# Patient Record
Sex: Female | Born: 1940 | Race: White | Hispanic: No | Smoking: Never smoker
Health system: Southern US, Community
[De-identification: ages and names within clinical notes are randomized; demographics above are authoritative.]

## PROBLEM LIST (undated history)

## (undated) DIAGNOSIS — I10 Essential (primary) hypertension: Secondary | ICD-10-CM

## (undated) DIAGNOSIS — M1990 Unspecified osteoarthritis, unspecified site: Secondary | ICD-10-CM

## (undated) DIAGNOSIS — E7800 Pure hypercholesterolemia, unspecified: Secondary | ICD-10-CM

## (undated) DIAGNOSIS — I7300 Raynaud's syndrome without gangrene: Secondary | ICD-10-CM

## (undated) HISTORY — PX: TUBAL LIGATION: SHX77

---

## 1999-10-19 ENCOUNTER — Other Ambulatory Visit: Admission: RE | Admit: 1999-10-19 | Discharge: 1999-10-19 | Payer: Self-pay | Admitting: Family Medicine

## 1999-11-09 ENCOUNTER — Encounter: Admission: RE | Admit: 1999-11-09 | Discharge: 1999-11-09 | Payer: Self-pay | Admitting: Family Medicine

## 1999-11-09 ENCOUNTER — Encounter: Payer: Self-pay | Admitting: Family Medicine

## 2000-11-07 ENCOUNTER — Other Ambulatory Visit: Admission: RE | Admit: 2000-11-07 | Discharge: 2000-11-07 | Payer: Self-pay | Admitting: *Deleted

## 2000-11-21 ENCOUNTER — Encounter: Admission: RE | Admit: 2000-11-21 | Discharge: 2000-11-21 | Payer: Self-pay | Admitting: Family Medicine

## 2000-11-21 ENCOUNTER — Encounter: Payer: Self-pay | Admitting: Family Medicine

## 2002-01-08 ENCOUNTER — Encounter: Admission: RE | Admit: 2002-01-08 | Discharge: 2002-01-08 | Payer: Self-pay | Admitting: Family Medicine

## 2002-01-08 ENCOUNTER — Encounter: Payer: Self-pay | Admitting: Family Medicine

## 2003-01-15 ENCOUNTER — Encounter: Payer: Self-pay | Admitting: Family Medicine

## 2003-01-15 ENCOUNTER — Encounter: Admission: RE | Admit: 2003-01-15 | Discharge: 2003-01-15 | Payer: Self-pay | Admitting: Family Medicine

## 2003-03-26 ENCOUNTER — Encounter: Admission: RE | Admit: 2003-03-26 | Discharge: 2003-03-26 | Payer: Self-pay | Admitting: Family Medicine

## 2003-10-22 ENCOUNTER — Emergency Department (HOSPITAL_COMMUNITY): Admission: EM | Admit: 2003-10-22 | Discharge: 2003-10-22 | Payer: Self-pay | Admitting: Family Medicine

## 2003-11-03 ENCOUNTER — Emergency Department (HOSPITAL_COMMUNITY): Admission: EM | Admit: 2003-11-03 | Discharge: 2003-11-03 | Payer: Self-pay | Admitting: Family Medicine

## 2004-09-14 ENCOUNTER — Ambulatory Visit: Payer: Self-pay | Admitting: Internal Medicine

## 2004-09-19 ENCOUNTER — Ambulatory Visit: Payer: Self-pay | Admitting: Internal Medicine

## 2005-02-16 ENCOUNTER — Emergency Department (HOSPITAL_COMMUNITY): Admission: EM | Admit: 2005-02-16 | Discharge: 2005-02-16 | Payer: Self-pay | Admitting: Emergency Medicine

## 2006-10-12 ENCOUNTER — Emergency Department (HOSPITAL_COMMUNITY): Admission: EM | Admit: 2006-10-12 | Discharge: 2006-10-12 | Payer: Self-pay | Admitting: Family Medicine

## 2006-10-16 ENCOUNTER — Emergency Department (HOSPITAL_COMMUNITY): Admission: EM | Admit: 2006-10-16 | Discharge: 2006-10-17 | Payer: Self-pay | Admitting: Emergency Medicine

## 2006-10-16 ENCOUNTER — Emergency Department (HOSPITAL_COMMUNITY): Admission: EM | Admit: 2006-10-16 | Discharge: 2006-10-16 | Payer: Self-pay | Admitting: Emergency Medicine

## 2008-11-05 ENCOUNTER — Emergency Department (HOSPITAL_COMMUNITY): Admission: EM | Admit: 2008-11-05 | Discharge: 2008-11-05 | Payer: Self-pay | Admitting: Emergency Medicine

## 2011-01-16 LAB — DIFFERENTIAL
Basophils Relative: 0
Eosinophils Absolute: 0.1
Eosinophils Relative: 1
Lymphs Abs: 1.3
Monocytes Relative: 9
Neutrophils Relative %: 79 — ABNORMAL HIGH

## 2011-01-16 LAB — CBC
HCT: 45.7
MCHC: 33.7
MCV: 91.9
RBC: 4.97
WBC: 11.5 — ABNORMAL HIGH

## 2011-01-16 LAB — URINALYSIS, ROUTINE W REFLEX MICROSCOPIC
Bilirubin Urine: NEGATIVE
Glucose, UA: NEGATIVE
Hgb urine dipstick: NEGATIVE
Ketones, ur: 15 — AB
Nitrite: NEGATIVE
Protein, ur: NEGATIVE
Specific Gravity, Urine: 1.02
Urobilinogen, UA: 0.2
pH: 7

## 2011-01-16 LAB — I-STAT 8, (EC8 V) (CONVERTED LAB)
Acid-Base Excess: 5 — ABNORMAL HIGH
Bicarbonate: 28.2 — ABNORMAL HIGH
Hemoglobin: 16.7 — ABNORMAL HIGH
Potassium: 4.1
Sodium: 136
TCO2: 29
pH, Ven: 7.511 — ABNORMAL HIGH

## 2011-10-09 ENCOUNTER — Other Ambulatory Visit: Payer: Self-pay | Admitting: Radiology

## 2012-12-18 ENCOUNTER — Emergency Department (HOSPITAL_COMMUNITY)
Admission: EM | Admit: 2012-12-18 | Discharge: 2012-12-18 | Disposition: A | Discharge status: Home / Self Care | Payer: Medicare Other | Source: Home / Self Care | Attending: Emergency Medicine | Admitting: Emergency Medicine

## 2012-12-18 ENCOUNTER — Encounter (HOSPITAL_COMMUNITY): Payer: Self-pay | Admitting: Emergency Medicine

## 2012-12-18 DIAGNOSIS — T6391XA Toxic effect of contact with unspecified venomous animal, accidental (unintentional), initial encounter: Secondary | ICD-10-CM

## 2012-12-18 HISTORY — DX: Pure hypercholesterolemia, unspecified: E78.00

## 2012-12-18 HISTORY — DX: Essential (primary) hypertension: I10

## 2012-12-18 HISTORY — DX: Unspecified osteoarthritis, unspecified site: M19.90

## 2012-12-18 HISTORY — DX: Raynaud's syndrome without gangrene: I73.00

## 2012-12-18 MED ORDER — SULFAMETHOXAZOLE-TMP DS 800-160 MG PO TABS: 1.0000 | ORAL_TABLET | Freq: Two times a day (BID) | ORAL | Status: DC

## 2012-12-18 MED ORDER — TRIAMCINOLONE ACETONIDE 0.1 % EX CREA: TOPICAL_CREAM | Freq: Three times a day (TID) | CUTANEOUS | Status: DC

## 2012-12-18 MED ORDER — CETIRIZINE HCL 10 MG PO TABS: 10.0000 mg | ORAL_TABLET | Freq: Every day | ORAL | Status: DC

## 2012-12-18 MED ORDER — CEPHALEXIN 500 MG PO CAPS: 500.0000 mg | ORAL_CAPSULE | Freq: Three times a day (TID) | ORAL | Status: DC

## 2012-12-18 NOTE — ED Notes (Signed)
Pt c/o insect bite to left forearm onset Monday yest her arm began to swell... sxs also include: redness, itching, localized fever Denies fevers Taking amoxicillin for an abscessed tooth... Has tried cortisone and benadryl Alert w/no signs of acute distress.

## 2012-12-18 NOTE — ED Provider Notes (Signed)
Chief Complaint:   Chief Complaint  Patient presents with  . Insect Bite    History of Present Illness:   Molly Wilcox is a 72 year old female who 3 days ago felt what she thought to be an insect stinging her on her left forearm. She felt just a sudden stinging sensation but did not see anything biting or stinging her and thereafter there was a small raised, reddened area on the forearm. This has gradually increased in size. It's red, swollen, itches, feels tight, and his slightly painful. She denies any fever or chills. She's had no numbness or tingling in the arm. She denies any difficulty breathing, wheezing, coughing, shortness of breath, or swelling of the lips, tongue, or throat.  Review of Systems:  Other than noted above, the patient denies any of the following symptoms: Systemic:  No fever, chills, sweats, weight loss, or fatigue. ENT:  No nasal congestion, rhinorrhea, sore throat, swelling of lips, tongue or throat. Resp:  No cough, wheezing, or shortness of breath. Skin:  No rash, itching, nodules, or suspicious lesions.  PMFSH:  Past medical history, family history, social history, meds, and allergies were reviewed. She's had an adverse reaction to prednisone in the past. The patient states that she became very drowsy, confused, disoriented, and dehydrated when she took it. Right now she takes atenolol, hydrochlorothiazide, and simvastatin. She has a history of high blood pressure, hypercholesterolemia, and osteoarthritis.  Physical Exam:   Vital signs:  BP 202/76  Pulse 84  Temp(Src) 98.3 F (36.8 C) (Oral)  Resp 16  SpO2 97% Gen:  Alert, oriented, in no distress. ENT:  Pharynx clear, no intraoral lesions, moist mucous membranes. Lungs:  Clear to auscultation. Skin:  There is an 11 x 12 cm raised, red area on the volar surface of the left forearm. This is mildly tender to touch. It has a fine maculopapular rash at the center. There is no visible stinger. No ulcerations. No  fluctuance. Radial pulse was full. All joints had a full range of motion without pain. Capillary refill is good and. Sensation was intact.     Assessment:  The encounter diagnosis was Insect sting, initial encounter.  This appears to be a severe local reaction to an insect sting. There is a possibility of infection, so will cover with antibiotics. Since she has had an adverse reaction to prednisone the past, I am reluctant to give her systemic steroids, Solu treat with topical steroids and antihistamine.  Plan:   1.  Meds:  The following meds were prescribed:   Discharge Medication List as of 12/18/2012  1:34 PM    START taking these medications   Details  cephALEXin (KEFLEX) 500 MG capsule Take 1 capsule (500 mg total) by mouth 3 (three) times daily., Starting 12/18/2012, Until Discontinued, Normal    cetirizine (ZYRTEC) 10 MG tablet Take 1 tablet (10 mg total) by mouth daily., Starting 12/18/2012, Until Discontinued, Normal    sulfamethoxazole-trimethoprim (BACTRIM DS) 800-160 MG per tablet Take 1 tablet by mouth 2 (two) times daily., Starting 12/18/2012, Until Discontinued, Normal    triamcinolone cream (KENALOG) 0.1 % Apply topically 3 (three) times daily., Starting 12/18/2012, Until Discontinued, Normal        2.  Patient Education/Counseling:  The patient was given appropriate handouts, self care instructions, and instructed in symptomatic relief.  Advised rest, ice, elevation.  3.  Follow up:  The patient was told to follow up if no better in 3 to 4 days, if becoming worse  in any way, and given some red flag symptoms such as fever or any difficulty breathing which would prompt immediate return.  Follow up here if necessary. The lesion was outlined with a dramatic graphic pan, I told her if the erythema seems to be spreading outside the confines of the markings to return for a recheck.      Reuben Likes, MD 12/18/12 [phone]

## 2013-05-06 LAB — HM COLONOSCOPY

## 2014-05-09 ENCOUNTER — Encounter (HOSPITAL_COMMUNITY): Payer: Self-pay | Admitting: Emergency Medicine

## 2014-05-09 ENCOUNTER — Emergency Department (INDEPENDENT_AMBULATORY_CARE_PROVIDER_SITE_OTHER)
Admission: EM | Admit: 2014-05-09 | Discharge: 2014-05-09 | Disposition: A | Discharge status: Home / Self Care | Payer: PPO | Source: Home / Self Care | Attending: Family Medicine | Admitting: Family Medicine

## 2014-05-09 DIAGNOSIS — J069 Acute upper respiratory infection, unspecified: Secondary | ICD-10-CM | POA: Diagnosis not present

## 2014-05-09 DIAGNOSIS — B309 Viral conjunctivitis, unspecified: Secondary | ICD-10-CM

## 2014-05-09 DIAGNOSIS — B9789 Other viral agents as the cause of diseases classified elsewhere: Secondary | ICD-10-CM

## 2014-05-09 MED ORDER — KETOROLAC TROMETHAMINE 0.5 % OP SOLN: 1.0000 [drp] | Freq: Four times a day (QID) | OPHTHALMIC | Status: DC

## 2014-05-09 MED ORDER — HYDROCODONE-HOMATROPINE 5-1.5 MG/5ML PO SYRP: 5.0000 mL | ORAL_SOLUTION | Freq: Four times a day (QID) | ORAL | Status: DC | PRN

## 2014-05-09 NOTE — ED Notes (Signed)
Reports noticing uri symptoms on Monday 2/1: initially laryngitis, continued cough, runny nose.  Denies sob, denies chills

## 2014-05-09 NOTE — Discharge Instructions (Signed)
Cough, Adult  A cough is a reflex that helps clear your throat and airways. It can help heal the body or may be a reaction to an irritated airway. A cough may only last 2 or 3 weeks (acute) or may last more than 8 weeks (chronic).  CAUSES Acute cough:  Viral or bacterial infections. Chronic cough:  Infections.  Allergies.  Asthma.  Post-nasal drip.  Smoking.  Heartburn or acid reflux.  Some medicines.  Chronic lung problems (COPD).  Cancer. SYMPTOMS   Cough.  Fever.  Chest pain.  Increased breathing rate.  High-pitched whistling sound when breathing (wheezing).  Colored mucus that you cough up (sputum). TREATMENT   A bacterial cough may be treated with antibiotic medicine.  A viral cough must run its course and will not respond to antibiotics.  Your caregiver may recommend other treatments if you have a chronic cough. HOME CARE INSTRUCTIONS   Only take over-the-counter or prescription medicines for pain, discomfort, or fever as directed by your caregiver. Use cough suppressants only as directed by your caregiver.  Use a cold steam vaporizer or humidifier in your bedroom or home to help loosen secretions.  Sleep in a semi-upright position if your cough is worse at night.  Rest as needed.  Stop smoking if you smoke. SEEK IMMEDIATE MEDICAL CARE IF:   You have pus in your sputum.  Your cough starts to worsen.  You cannot control your cough with suppressants and are losing sleep.  You begin coughing up blood.  You have difficulty breathing.  You develop pain which is getting worse or is uncontrolled with medicine.  You have a fever. MAKE SURE YOU:   Understand these instructions.  Will watch your condition.  Will get help right away if you are not doing well or get worse. Document Released: 09/16/2010 Document Revised: 06/12/2011 Document Reviewed: 09/16/2010 [facility] Patient Information 2015 [place], [place]. This information is not intended  to replace advice given to you by your health care provider. Make sure you discuss any questions you have with your health care provider.  Upper Respiratory Infection, Adult An upper respiratory infection (URI) is also sometimes known as the common cold. The upper respiratory tract includes the nose, sinuses, throat, trachea, and bronchi. Bronchi are the airways leading to the lungs. Most people improve within 1 week, but symptoms can last up to 2 weeks. A residual cough may last even longer.  CAUSES Many different viruses can infect the tissues lining the upper respiratory tract. The tissues become irritated and inflamed and often become very moist. Mucus production is also common. A cold is contagious. You can easily spread the virus to others by oral contact. This includes kissing, sharing a glass, coughing, or sneezing. Touching your mouth or nose and then touching a surface, which is then touched by another person, can also spread the virus. SYMPTOMS  Symptoms typically develop 1 to 3 days after you come in contact with a cold virus. Symptoms vary from person to person. They may include:  Runny nose.  Sneezing.  Nasal congestion.  Sinus irritation.  Sore throat.  Loss of voice (laryngitis).  Cough.  Fatigue.  Muscle aches.  Loss of appetite.  Headache.  Low-grade fever. DIAGNOSIS  You might diagnose your own cold based on familiar symptoms, since most people get a cold 2 to 3 times a year. Your caregiver can confirm this based on your exam. Most importantly, your caregiver can check that your symptoms are not due to another disease  such as strep throat, sinusitis, pneumonia, asthma, or epiglottitis. Blood tests, throat tests, and X-rays are not necessary to diagnose a common cold, but they may sometimes be helpful in excluding other more serious diseases. Your caregiver will decide if any further tests are required. RISKS AND COMPLICATIONS  You may be at risk for a more severe  case of the common cold if you smoke cigarettes, have chronic heart disease (such as heart failure) or lung disease (such as asthma), or if you have a weakened immune system. The very young and very old are also at risk for more serious infections. Bacterial sinusitis, middle ear infections, and bacterial pneumonia can complicate the common cold. The common cold can worsen asthma and chronic obstructive pulmonary disease (COPD). Sometimes, these complications can require emergency medical care and may be life-threatening. PREVENTION  The best way to protect against getting a cold is to practice good hygiene. Avoid oral or hand contact with people with cold symptoms. Wash your hands often if contact occurs. There is no clear evidence that vitamin C, vitamin E, echinacea, or exercise reduces the chance of developing a cold. However, it is always recommended to get plenty of rest and practice good nutrition. TREATMENT  Treatment is directed at relieving symptoms. There is no cure. Antibiotics are not effective, because the infection is caused by a virus, not by bacteria. Treatment may include:  Increased fluid intake. Sports drinks offer valuable electrolytes, sugars, and fluids.  Breathing heated mist or steam (vaporizer or shower).  Eating chicken soup or other clear broths, and maintaining good nutrition.  Getting plenty of rest.  Using gargles or lozenges for comfort.  Controlling fevers with ibuprofen or acetaminophen as directed by your caregiver.  Increasing usage of your inhaler if you have asthma. Zinc gel and zinc lozenges, taken in the first 24 hours of the common cold, can shorten the duration and lessen the severity of symptoms. Pain medicines may help with fever, muscle aches, and throat pain. A variety of non-prescription medicines are available to treat congestion and runny nose. Your caregiver can make recommendations and may suggest nasal or lung inhalers for other symptoms.  HOME  CARE INSTRUCTIONS   Only take over-the-counter or prescription medicines for pain, discomfort, or fever as directed by your caregiver.  Use a warm mist humidifier or inhale steam from a shower to increase air moisture. This may keep secretions moist and make it easier to breathe.  Drink enough water and fluids to keep your urine clear or pale yellow.  Rest as needed.  Return to work when your temperature has returned to normal or as your caregiver advises. You may need to stay home longer to avoid infecting others. You can also use a face mask and careful hand washing to prevent spread of the virus. SEEK MEDICAL CARE IF:   After the first few days, you feel you are getting worse rather than better.  You need your caregiver's advice about medicines to control symptoms.  You develop chills, worsening shortness of breath, or brown or red sputum. These may be signs of pneumonia.  You develop yellow or brown nasal discharge or pain in the face, especially when you bend forward. These may be signs of sinusitis.  You develop a fever, swollen neck glands, pain with swallowing, or white areas in the back of your throat. These may be signs of strep throat. SEEK IMMEDIATE MEDICAL CARE IF:   You have a fever.  You develop severe or persistent headache,  ear pain, sinus pain, or chest pain.  You develop wheezing, a prolonged cough, cough up blood, or have a change in your usual mucus (if you have chronic lung disease).  You develop sore muscles or a stiff neck. Document Released: 09/13/2000 Document Revised: 06/12/2011 Document Reviewed: 06/25/2013 [facility] Patient Information 2015 [place], [place]. This information is not intended to replace advice given to you by your health care provider. Make sure you discuss any questions you have with your health care provider.  Viral Conjunctivitis Conjunctivitis is an irritation (inflammation) of the clear membrane that covers the white part of the eye  (the conjunctiva). The irritation can also happen on the underside of the eyelids. Conjunctivitis makes the eye red or pink in color. This is what is commonly known as pink eye. Viral conjunctivitis can spread easily (contagious). CAUSES   Infection from virus on the surface of the eye.  Infection from the irritation or injury of nearby tissues such as the eyelids or cornea.  More serious inflammation or infection on the inside of the eye.  Other eye diseases.  The use of certain eye medications. SYMPTOMS  The normally white color of the eye or the underside of the eyelid is usually pink or red in color. The pink eye is usually associated with irritation, tearing and some sensitivity to light. Viral conjunctivitis is often associated with a clear, watery discharge. If a discharge is present, there may also be some blurred vision in the affected eye. DIAGNOSIS  Conjunctivitis is diagnosed by an eye exam. The eye specialist looks for changes in the surface tissues of the eye which take on changes characteristic of the specific types of conjunctivitis. A sample of any discharge may be collected on a Q-Tip (sterile swap). The sample will be sent to a lab to see whether or not the inflammation is caused by bacterial or viral infection. TREATMENT  Viral conjunctivitis will not respond to medicines that kill germs (antibiotics). Treatment is aimed at stopping a bacterial infection on top of the viral infection. The goal of treatment is to relieve symptoms (such as itching) with antihistamine drops or other eye medications.  HOME CARE INSTRUCTIONS   To ease discomfort, apply a cool, clean wash cloth to your eye for 10 to 20 minutes, 3 to 4 times a day.  Gently wipe away any drainage from the eye with a warm, wet washcloth or a cotton ball.  Wash your hands often with soap and use paper towels to dry.  Do not share towels or washcloths. This may spread the infection.  Change or wash your  pillowcase every day.  You should not use eye make-up until the infection is gone.  Stop using contacts lenses. Ask your eye professional how to sterilize or replace them before using again. This depends on the type of contact lenses used.  Do not touch the edge of the eyelid with the eye drop bottle or ointment tube when applying medications to the affected eye. This will stop you from spreading the infection to the other eye or to others. SEEK IMMEDIATE MEDICAL CARE IF:   The infection has not improved within 3 days of beginning treatment.  A watery discharge from the eye develops.  Pain in the eye increases.  The redness is spreading.  Vision becomes blurred.  An oral temperature above 102 F (38.9 C) develops, or as your caregiver suggests.  Facial pain, redness or swelling develops.  Any problems that may be related to the prescribed  medicine develop. MAKE SURE YOU:   Understand these instructions.  Will watch your condition.  Will get help right away if you are not doing well or get worse. Document Released: 03/20/2005 Document Revised: 06/12/2011 Document Reviewed: 11/07/2007 [facility] Patient Information 2015 [place], [place]. This information is not intended to replace advice given to you by your health care provider. Make sure you discuss any questions you have with your health care provider.

## 2014-05-09 NOTE — ED Provider Notes (Signed)
CSN: [id number]     Arrival date & time 05/09/14  1101 History   First MD Initiated Contact with Patient 05/09/14 1123     Chief Complaint  Patient presents with  . URI  . Conjunctivitis   (Consider location/radiation/quality/duration/timing/severity/associated sxs/prior Treatment) HPI       74 year old female presents complaining of cough, congestion, rhinorrhea, and conjunctivitis. Her symptoms started on Tuesday. She has been taking over-the-counter medications for the cough which are helping. She is using Visine for eyes which is not helping. This started in both eyes at the same time. She has redness and clear drainage, and burning sensation. No blurry vision. No recent travel or sick contacts.  Past Medical History  Diagnosis Date  . Hypertension   . High cholesterol   . Osteoarthritis   . Raynaud disease    Past Surgical History  Procedure Laterality Date  . Tubal ligation     No family history on file. History  Substance Use Topics  . Smoking status: Never Smoker   . Smokeless tobacco: Not on file  . Alcohol Use: Yes   OB History    No data available     Review of Systems  HENT: Positive for congestion, rhinorrhea and sneezing.   Eyes: Positive for discharge, redness and itching.  Respiratory: Positive for cough. Negative for shortness of breath.   Cardiovascular: Negative for chest pain.  All other systems reviewed and are negative.   Allergies  Review of patient's allergies indicates no known allergies.  Home Medications   Prior to Admission medications   Medication Sig Start Date End Date Taking? Authorizing Provider  aspirin 325 MG EC tablet Take 325 mg by mouth daily.   Yes Historical Provider, MD  atenolol (TENORMIN) 25 MG tablet Take 25 mg by mouth daily.    Historical Provider, MD  cephALEXin (KEFLEX) 500 MG capsule Take 1 capsule (500 mg total) by mouth 3 (three) times daily. 12/18/12   Harden Mo, MD  cetirizine (ZYRTEC) 10 MG tablet Take 1  tablet (10 mg total) by mouth daily. 12/18/12   Harden Mo, MD  Cholecalciferol (VITAMIN D-3 PO) Take by mouth.    Historical Provider, MD  HYDROcodone-homatropine (HYCODAN) 5-1.5 MG/5ML syrup Take 5 mLs by mouth every 6 (six) hours as needed for cough. 05/09/14   Liam Graham, PA-C  ketorolac (ACULAR) 0.5 % ophthalmic solution Place 1 drop into both eyes every 6 (six) hours. 05/09/14   Liam Graham, PA-C  simvastatin (ZOCOR) 10 MG tablet Take 10 mg by mouth at bedtime.    Historical Provider, MD  sulfamethoxazole-trimethoprim (BACTRIM DS) 800-160 MG per tablet Take 1 tablet by mouth 2 (two) times daily. 12/18/12   Harden Mo, MD  triamcinolone cream (KENALOG) 0.1 % Apply topically 3 (three) times daily. 12/18/12   Harden Mo, MD  triamterene-hydrochlorothiazide (MAXZIDE-25) 37.5-25 MG per tablet Take 1 tablet by mouth daily.    Historical Provider, MD   BP 162/85 mmHg  Pulse 91  Temp(Src) 97.9 F (36.6 C) (Oral)  Resp 20  SpO2 98% Physical Exam  Constitutional: She is oriented to person, place, and time. Vital signs are normal. She appears well-developed and well-nourished. No distress.  HENT:  Head: Normocephalic and atraumatic.  Right Ear: External ear normal.  Left Ear: External ear normal.  Nose: Nose normal.  Mouth/Throat: Oropharynx is clear and moist. No oropharyngeal exudate.  Eyes: Right conjunctiva is injected. Left conjunctiva is injected.  Neck: Normal range of  motion. Neck supple.  Cardiovascular: Normal rate, regular rhythm and normal heart sounds.   Pulmonary/Chest: Effort normal and breath sounds normal. No respiratory distress.  Lymphadenopathy:    She has no cervical adenopathy.  Neurological: She is alert and oriented to person, place, and time. She has normal strength. Coordination normal.  Skin: Skin is warm and dry. No rash noted. She is not diaphoretic.  Psychiatric: She has a normal mood and affect. Judgment normal.  Nursing note and vitals  reviewed.   ED Course  Procedures (including critical care time) Labs Review Labs Reviewed - No data to display  Imaging Review No results found.   MDM   1. Viral conjunctivitis   2. Viral URI with cough    Most likely viral conjunctivitis with the symmetric bilateral conjunctivitis and upper respiratory infection symptoms. Will provide better cough medicine for at night. She has still been up at night coughing. Also ketorolac drops for eye discomfort. Follow-up when necessary  Meds ordered this encounter  Medications  . aspirin 325 MG EC tablet    Sig: Take 325 mg by mouth daily.  [name] Kitchen HYDROcodone-homatropine (HYCODAN) 5-1.5 MG/5ML syrup    Sig: Take 5 mLs by mouth every 6 (six) hours as needed for cough.    Dispense:  120 mL    Refill:  0  . ketorolac (ACULAR) 0.5 % ophthalmic solution    Sig: Place 1 drop into both eyes every 6 (six) hours.    Dispense:  5 mL    Refill:  0       Liam Graham, PA-C 05/09/14 1141

## 2014-07-09 ENCOUNTER — Encounter: Payer: Self-pay | Admitting: Internal Medicine

## 2015-02-08 LAB — HM MAMMOGRAPHY

## 2015-04-03 LAB — BASIC METABOLIC PANEL
BUN: 23 mg/dL — AB (ref 4–21)
Creatinine: 0.9 mg/dL (ref 0.5–1.1)
GLUCOSE: 102 mg/dL
POTASSIUM: 4.6 mmol/L (ref 3.4–5.3)
Sodium: 139 mmol/L (ref 137–147)

## 2015-04-03 LAB — CBC AND DIFFERENTIAL
HCT: 47 % — AB (ref 36–46)
Hemoglobin: 15.6 g/dL (ref 12.0–16.0)
Platelets: 274 10*3/uL (ref 150–399)
WBC: 7.4 10^3/mL

## 2015-04-03 LAB — HEPATIC FUNCTION PANEL
ALT: 22 U/L (ref 7–35)
AST: 22 U/L (ref 13–35)
Alkaline Phosphatase: 133 U/L — AB (ref 25–125)

## 2015-04-03 LAB — LIPID PANEL
CHOLESTEROL: 160 mg/dL (ref 0–200)
HDL: 62 mg/dL (ref 35–70)
LDL Cholesterol: 78 mg/dL
TRIGLYCERIDES: 102 mg/dL (ref 40–160)

## 2015-07-29 DIAGNOSIS — R921 Mammographic calcification found on diagnostic imaging of breast: Secondary | ICD-10-CM | POA: Diagnosis not present

## 2015-10-12 ENCOUNTER — Ambulatory Visit (INDEPENDENT_AMBULATORY_CARE_PROVIDER_SITE_OTHER): Payer: PPO | Admitting: Family Medicine

## 2015-10-12 ENCOUNTER — Encounter: Payer: Self-pay | Admitting: Family Medicine

## 2015-10-12 VITALS — BP 150/80 | HR 56 | Temp 97.9°F | Resp 12 | Wt 145.0 lb

## 2015-10-12 DIAGNOSIS — R001 Bradycardia, unspecified: Secondary | ICD-10-CM

## 2015-10-12 DIAGNOSIS — M810 Age-related osteoporosis without current pathological fracture: Secondary | ICD-10-CM | POA: Diagnosis not present

## 2015-10-12 DIAGNOSIS — I10 Essential (primary) hypertension: Secondary | ICD-10-CM | POA: Diagnosis not present

## 2015-10-12 DIAGNOSIS — E559 Vitamin D deficiency, unspecified: Secondary | ICD-10-CM | POA: Diagnosis not present

## 2015-10-12 DIAGNOSIS — E7800 Pure hypercholesterolemia, unspecified: Secondary | ICD-10-CM | POA: Diagnosis not present

## 2015-10-12 LAB — LIPID PANEL
Cholesterol: 163 mg/dL (ref 0–200)
HDL: 52.2 mg/dL (ref >39.00)
LDL CALC: 86 mg/dL (ref 0–99)
NONHDL: 110.55
Total CHOL/HDL Ratio: 3
Triglycerides: 124 mg/dL (ref 0.0–149.0)
VLDL: 24.8 mg/dL (ref 0.0–40.0)

## 2015-10-12 LAB — COMPREHENSIVE METABOLIC PANEL
ALT: 28 U/L (ref 0–35)
AST: 24 U/L (ref 0–37)
Albumin: 4.6 g/dL (ref 3.5–5.2)
Alkaline Phosphatase: 66 U/L (ref 39–117)
BUN: 25 mg/dL — AB (ref 6–23)
CHLORIDE: 99 meq/L (ref 96–112)
CO2: 31 meq/L (ref 19–32)
Calcium: 9.7 mg/dL (ref 8.4–10.5)
Creatinine, Ser: 0.81 mg/dL (ref 0.40–1.20)
GFR: 73.11 mL/min (ref >60.00)
GLUCOSE: 105 mg/dL — AB (ref 70–99)
POTASSIUM: 4.3 meq/L (ref 3.5–5.1)
SODIUM: 138 meq/L (ref 135–145)
Total Bilirubin: 0.8 mg/dL (ref 0.2–1.2)
Total Protein: 7.2 g/dL (ref 6.0–8.3)

## 2015-10-12 LAB — VITAMIN D 25 HYDROXY (VIT D DEFICIENCY, FRACTURES): VITD: 44.04 ng/mL (ref 30.00–100.00)

## 2015-10-12 LAB — TSH: TSH: 1.8 u[IU]/mL (ref 0.35–4.50)

## 2015-10-12 MED ORDER — TRIAMTERENE-HCTZ 37.5-25 MG PO TABS: 1.0000 | ORAL_TABLET | Freq: Every day | ORAL | Status: DC

## 2015-10-12 MED ORDER — SIMVASTATIN 10 MG PO TABS: 10.0000 mg | ORAL_TABLET | Freq: Every day | ORAL | Status: DC

## 2015-10-12 MED ORDER — ALENDRONATE SODIUM 70 MG PO TABS: ORAL_TABLET | ORAL | Status: DC

## 2015-10-12 MED ORDER — ATENOLOL 25 MG PO TABS: 25.0000 mg | ORAL_TABLET | Freq: Every day | ORAL | Status: DC

## 2015-10-12 NOTE — Progress Notes (Signed)
Pre visit review using our clinic review tool, if applicable. No additional management support is needed unless otherwise documented below in the visit note. 

## 2015-10-12 NOTE — Patient Instructions (Signed)
A few things to remember from today's visit:   Essential hypertension - Plan: Comprehensive metabolic panel, TSH, atenolol (TENORMIN) 25 MG tablet, triamterene-hydrochlorothiazide (MAXZIDE-25) 37.5-25 MG tablet  Pure hypercholesterolemia - Plan: Lipid panel, simvastatin (ZOCOR) 10 MG tablet  Vitamin D deficiency - Plan: VITAMIN D 25 Hydroxy (Vit-D Deficiency, Fractures)  Osteoporosis - Plan: TSH, alendronate (FOSAMAX) 70 MG tablet  A few tips:  -As we age balance is not as good as it was, so there is a higher risks for falls. Please remove small rugs and furniture that is "in your way" and could increase the risk of falls. Stretching exercises may help with fall prevention: Yoga and Tai Chi are some examples. Low impact exercise is better, so you are not very achy the next day.  -Sun screen and avoidance of direct sun light recommended. Caution with dehydration, if working outdoors be sure to drink enough fluids.  - Some medications are not safe as we age, increases the risk of side effects and can potentially interact with other medication you are also taken;  including some of over the counter medications. Be sure to let me know when you start a new medication even if it is a dietary/vitamin supplement.   -Healthy diet low in red meet/animal fat and sugar + regular physical activity is recommended.      Please be sure medication list is accurate. If a new problem present, please set up appointment sooner than planned today.

## 2015-10-12 NOTE — Progress Notes (Signed)
HPI:   Molly Wilcox is a 75 y.o. female, who is here today to establish care with me.  Former PCP: [facility] Last preventive routine visit: 03/2015.  She has history of osteoporosis, she was started on Fosamax December 2016, tolerating well, no side effects reported. She has history of vitamin D deficiency, currently she is on OTC D3 1000 units daily.   She also has history of HTN and HLD.  Takes Zantac as needed for "indigestion", associated to certain food.  Concerns today: Medications refill.   Hyperlipidemia: She is on simvastatin 10 mg daily.  Following a low fat diet. She has not noted side effects with medication.  Hypertension: Currently she is on Maxzide 37.5-25 mg daily and atenolol 25 mg daily. She is taking medications as instructed, no side effects reported.  She has not noted unusual headache, visual changes, exertional chest pain, dyspnea,  focal weakness, or edema. She has a BP monitor but does not check her BP. She denies any history of CVD. Last eye exam about 2 years ago.  She lives with husband, who has a history of MS, she is his caregiver.  Independent ADL's except for mild stress urinary incontinence, independent IADL's. No falls in the past year and denies depression symptoms.    She is not sure about the last time she had lab work, she thinks it was a year ago.   Review of Systems  Constitutional: Negative for fever, activity change, appetite change, fatigue and unexpected weight change.  HENT: Negative for mouth sores, nosebleeds and trouble swallowing.   Eyes: Negative for redness and visual disturbance.  Respiratory: Negative for cough, shortness of breath and wheezing.   Cardiovascular: Negative for chest pain, palpitations and leg swelling.  Gastrointestinal: Negative for nausea, vomiting and abdominal pain.       Negative for changes in bowel habits.  Genitourinary: Negative for dysuria, hematuria, decreased  urine volume and difficulty urinating.  Skin: Negative for color change and rash.  Neurological: Negative for seizures, syncope, weakness, numbness and headaches.  Psychiatric/Behavioral: Negative for hallucinations, confusion and sleep disturbance.      Current Outpatient Prescriptions on File Prior to Visit  Medication Sig Dispense Refill  . aspirin 325 MG EC tablet Take 325 mg by mouth daily.     No current facility-administered medications on file prior to visit.     Past Medical History  Diagnosis Date  . Hypertension   . High cholesterol   . Osteoarthritis   . Raynaud disease    Allergies  Allergen Reactions  . Ibuprofen   . Prednisone     oral    History reviewed. No pertinent family history.  Social History   Social History  . Marital Status: Married    Spouse Name: N/A  . Number of Children: N/A  . Years of Education: N/A   Social History Main Topics  . Smoking status: Never Smoker   . Smokeless tobacco: None  . Alcohol Use: Yes  . Drug Use: No  . Sexual Activity: Not Asked   Other Topics Concern  . None   Social History Narrative    Filed Vitals:   10/12/15 1010  BP: 150/80  Pulse: 56  Temp: 97.9 F (36.6 C)  Resp: 12    There is no height on file to calculate BMI.      Physical Exam  Nursing note and vitals reviewed. Constitutional: She is oriented to person, place, and time. She appears  well-developed and well-nourished. No distress.  HENT:  Head: Atraumatic.  Mouth/Throat: Oropharynx is clear and moist and mucous membranes are normal.  Eyes: Conjunctivae and EOM are normal. Pupils are equal, round, and reactive to light.  Neck: No tracheal deviation present. No thyroid mass present.  Cardiovascular: Regular rhythm.  Bradycardia present.   No murmur heard. Pulses:      Dorsalis pedis pulses are 2+ on the right side, and 2+ on the left side.  Respiratory: Effort normal and breath sounds normal. No respiratory distress.  GI:  Soft. She exhibits no mass. There is no tenderness.  Musculoskeletal: She exhibits no edema or tenderness.  Lymphadenopathy:    She has no cervical adenopathy.  Neurological: She is alert and oriented to person, place, and time. She has normal strength. Coordination normal.  Skin: Skin is warm. No erythema.  Psychiatric: She has a normal mood and affect.  Well groomed, good eye contact.      ASSESSMENT AND PLAN:     Chemistry      Component Value Date/Time   NA 138 10/12/2015 1057   K 4.3 10/12/2015 1057   CL 99 10/12/2015 1057   CO2 31 10/12/2015 1057   BUN 25* 10/12/2015 1057   CREATININE 0.81 10/12/2015 1057      Component Value Date/Time   CALCIUM 9.7 10/12/2015 1057   ALKPHOS 66 10/12/2015 1057   AST 24 10/12/2015 1057   ALT 28 10/12/2015 1057   BILITOT 0.8 10/12/2015 1057     Lab Results  Component Value Date   TSH 1.80 10/12/2015   Lab Results  Component Value Date   CHOL 163 10/12/2015   HDL 52.20 10/12/2015   LDLCALC 86 10/12/2015   TRIG 124.0 10/12/2015   CHOLHDL 3 10/12/2015     [name] was seen today for new patient (initial visit).  Diagnoses and all orders for this visit:  Essential hypertension  BP re-checked 155/70, goal < 150/90. She states that she was stressed today, will monitor BP at home. No changes in current management. DASH diet recommended. Eye exam recommended annually. F/U in 3-4 months, before if needed.    -     Comprehensive metabolic panel -     TSH -     atenolol (TENORMIN) 25 MG tablet; Take 1 tablet (25 mg total) by mouth daily. -     triamterene-hydrochlorothiazide (MAXZIDE-25) 37.5-25 MG tablet; Take 1 tablet by mouth daily.  Pure hypercholesterolemia  No changes in current management, will follow labs done today and will give further recommendations accordingly. Low fat diet to continue. F/U in 6-12 months.  -     Lipid panel -     simvastatin (ZOCOR) 10 MG tablet; Take 1 tablet (10 mg total) by mouth at  bedtime.  Vitamin D deficiency  No changes in current management, will follow labs done today and will give further recommendations accordingly.   -     VITAMIN D 25 Hydroxy (Vit-D Deficiency, Fractures)  Osteoporosis  Fall precautions. Ca++ 1200 mg and Vit D [phone] U daily. Regular exercise. No changes in Fosamax, some side effects discussed.  F/U in a year.  -     TSH -     alendronate (FOSAMAX) 70 MG tablet; TAKE 1 TABLET(S) EVERY WEEK BY ORAL ROUTE  Sinus bradycardia  Mild. For now no changes on Atenolol, instructed to check HR periodically. Instructed about warning signs.     Waiting for records from [facility]. Reporting vaccination up to date  as well as colonoscopy. Mammogram recommended to be repeated in 02/2016.   Deeric Cruise G. Martinique, MD  [facility]. [place] office.

## 2015-10-22 ENCOUNTER — Encounter: Payer: Self-pay | Admitting: Family Medicine

## 2016-02-03 DIAGNOSIS — R920 Mammographic microcalcification found on diagnostic imaging of breast: Secondary | ICD-10-CM | POA: Diagnosis not present

## 2016-02-03 LAB — HM MAMMOGRAPHY

## 2016-02-14 ENCOUNTER — Ambulatory Visit: Payer: Self-pay | Admitting: Family Medicine

## 2016-02-14 DIAGNOSIS — Z0289 Encounter for other administrative examinations: Secondary | ICD-10-CM

## 2016-02-29 ENCOUNTER — Encounter: Payer: Self-pay | Admitting: Family Medicine

## 2016-03-20 NOTE — Progress Notes (Addendum)
 Subjective:   Molly Wilcox is a 75 y.o. female who presents for Medicare Annual (Subsequent) preventive examination.  The Patient was informed that the wellness visit is to identify future health risk and educate and initiate measures that can reduce risk for increased disease through the lifespan.    NO ROS; Medicare Wellness Visit 10/2015 OV Dr. Jordan;  Formerly with [facility]   Describes health as good, fair or great?   Preventive Screening -Counseling & Management  DEXA Ostoeporosis 02/04/2015 -2.7(fosamax) Colonoscopy 09/2004- Dr Hung aged out Mammogram 02/2016  Smoking history/ never smoked  Second Hand Smoke status; No Smokers in the home currently x 35 + years   ETOH YES ; JUST socially   RISK FACTORS Regular exercise does tai chi but is very active Doesn't walk like she should; was walking 3 miles;  States her pedometer still shows 7000 to 10000 miles when tracking    Diet; on  Fall risk no falls;  Mobility of Functional changes this year? no Safety; community, wears sunscreen, safe place for firearms; Motor vehicle accidents; no issues   Cardiac Risk Factors:  Advanced aged >65 in women Hyperlipidemia - chol 163; Trig 124; HDL 52; LDL 86 Diabetes - FBS 105 Family History neg Obesity neg  Eye exam just had at [facility] on 02/2016  Depression Screen PhQ 2: negative  Activities of Daily Living - See functional screen   Cognitive testing; Ad8 score; 0 or less than 2  MMSE deferred or completed if AD8 + 2 issues  Advanced Directives updating currently with lawyer  Patient Care Team: Betty G Jordan, MD as PCP - General (Family Medicine)   Immunization History  Administered Date(s) Administered  . Influenza, High Dose Seasonal PF 01/02/2016   Required Immunizations needed today  Screening test up to date or reviewed for plan of completion Health Maintenance Due  Topic Date Due  . PNA vac Low Risk Adult (1 of 2 - PCV13) 12/29/2004    Declines  tetanus currently  Can have tdap at any time Declines zoster States she has had both pneumonia vaccines [facility]  Last colonoscopy was 09/2014/ Dr Hung   Cardiac Risk Factors include: dyslipidemia;hypertension     Objective:     Vitals: BP 130/70   Pulse (!) 57   Ht 5' 3" (1.6 m)   Wt 145 lb 2 oz (65.8 kg)   SpO2 94%   BMI 25.71 kg/m   Body mass index is 25.71 kg/m.   Tobacco History  Smoking Status  . Never Smoker  Smokeless Tobacco  . Not on file     Counseling given: Yes   Past Medical History:  Diagnosis Date  . High cholesterol   . Hypertension   . Osteoarthritis   . Raynaud disease    Past Surgical History:  Procedure Laterality Date  . TUBAL LIGATION     No family history on file. History  Sexual Activity  . Sexual activity: Not on file    Outpatient Encounter Prescriptions as of 03/21/2016  Medication Sig  . alendronate (FOSAMAX) 70 MG tablet TAKE 1 TABLET(S) EVERY WEEK BY ORAL ROUTE  . aspirin 325 MG EC tablet Take 325 mg by mouth daily.  . atenolol (TENORMIN) 25 MG tablet Take 1 tablet (25 mg total) by mouth daily.  . ranitidine (ZANTAC) 150 MG tablet Take 150 mg by mouth at bedtime.  . simvastatin (ZOCOR) 10 MG tablet Take 1 tablet (10 mg total) by mouth at   bedtime.  . triamterene-hydrochlorothiazide (MAXZIDE-25) 37.5-25 MG tablet Take 1 tablet by mouth daily.   No facility-administered encounter medications on file as of 03/21/2016.     Activities of Daily Living In your present state of health, do you have any difficulty performing the following activities: 03/21/2016  Hearing? N  Vision? N  Difficulty concentrating or making decisions? N  Walking or climbing stairs? N  Dressing or bathing? N  Doing errands, shopping? N  Preparing Food and eating ? N  Using the Toilet? N  In the past six months, have you accidently leaked urine? Y  Do you have problems with loss of bowel control? N  Managing your Medications? N    Managing your Finances? N  Housekeeping or managing your Housekeeping? N  Some recent data might be hidden    Patient Care Team: Betty G Martinique, MD as PCP - General (Family Medicine)    Assessment:   Exercise Activities and Dietary recommendations Current Exercise Habits: Home exercise routine;Structured exercise class, Type of exercise: walking, Time (Minutes): 60, Frequency (Times/Week): 5 (iphone 7000 to 10000 ), Weekly Exercise (Minutes/Week): 300  Goals    . patient          Keep going to [place]  Will keep going there as much as you can      Fall Risk Fall Risk  03/21/2016  Falls in the past year? No   Depression Screen PHQ 2/9 Scores 03/21/2016  PHQ - 2 Score 0     Cognitive Function MMSE - Mini Mental State Exam 03/21/2016  Not completed: (No Data)    Ad8 score 0     Immunization History  Administered Date(s) Administered  . Influenza, High Dose Seasonal PF 01/02/2016   Screening Tests Health Maintenance  Topic Date Due  . PNA vac Low Risk Adult (1 of 2 - PCV13) 12/29/2004  . TETANUS/TDAP  07/01/2016 (Originally 12/30/1959)  . ZOSTAVAX  04/02/2017 (Originally 12/30/1999)  . INFLUENZA VACCINE  Completed  . DEXA SCAN  Completed      Plan:     Will check with Dr. Benson Norway and repeat colonoscopy as you had one 2 years ago   Eye exam completed Zoster; pass for now; will see what the 2018 zoster   Manuela Schwartz will try to call [facility] [place] [facility] for record   Flu vaccine given 01/2016  Keep taking vit d and calcium Can go to the osteoporosis foundation for more information Will have vit d drawn at next visit   During the course of the visit the patient was educated and counseled about the following appropriate screening and preventive services:   Vaccines to include Pneumoccal, Influenza, Hepatitis B, Td, Zostavax, HCV  Electrocardiogram  Cardiovascular Disease  Colorectal cancer screening  Bone density screening taking fosamax; calcium and vit d  1000  Diabetes screening neg  Glaucoma screening no   Mammography/completed this year  Nutrition counseling  adequate  Patient Instructions (the written plan) was given to the patient.   MAUQJ,FHLKT, RN  03/21/2016    addend [facility]; pneumonia series completed and documented Also call to dr. Benson Norway; last colonoscopy 05/06/2013 and was neg; clean colon; no repeats; abstracted

## 2016-03-21 ENCOUNTER — Telehealth: Payer: Self-pay

## 2016-03-21 ENCOUNTER — Ambulatory Visit (INDEPENDENT_AMBULATORY_CARE_PROVIDER_SITE_OTHER): Payer: PPO

## 2016-03-21 VITALS — BP 130/70 | HR 57 | Ht 63.0 in | Wt 145.1 lb

## 2016-03-21 DIAGNOSIS — Z0000 Encounter for general adult medical examination without abnormal findings: Secondary | ICD-10-CM | POA: Diagnosis not present

## 2016-03-21 NOTE — Telephone Encounter (Signed)
[facility] Confirmed The patient rec'd prevnar 12.28/2015 rec'd the PSV 23 12/29/2014 Will enter in epic

## 2016-03-21 NOTE — Patient Instructions (Addendum)
Molly Wilcox , Thank you for taking time to come for your Medicare Wellness Visit. I appreciate your ongoing commitment to your health goals. Please review the following plan we discussed and let me know if I can assist you in the future.   Will check with Dr. Benson Norway and repeat colonoscopy as you had one 2 years ago   Eye exam completed Zoster; pass for now; will see what the 2018 zoster   Manuela Schwartz will try to call [facility] for record   Flu vaccine given 01/2016  Keep taking vit d and calcium Can go to the osteoporosis [facility] for more information Will have vit d drawn at next visit   Weight is great!!!    These are the goals we discussed: Goals    . patient          Keep going to [employer]  Will keep going there as much as you can       This is a list of the screening recommended for you and due dates:  Health Maintenance  Topic Date Due  . Pneumonia vaccines (1 of 2 - PCV13) 12/29/2004  . Tetanus Vaccine  07/01/2016*  . Shingles Vaccine  04/02/2017*  . Flu Shot  Completed  . DEXA scan (bone density measurement)  Completed  *Topic was postponed. The date shown is not the original due date.        Fall Prevention in the Home Introduction Falls can cause injuries. They can happen to people of all ages. There are many things you can do to make your home safe and to help prevent falls. What can I do on the outside of my home?  Regularly fix the edges of walkways and driveways and fix any cracks.  Remove anything that might make you trip as you walk through a door, such as a raised step or threshold.  Trim any bushes or trees on the path to your home.  Use bright outdoor lighting.  Clear any walking paths of anything that might make someone trip, such as rocks or tools.  Regularly check to see if handrails are loose or broken. Make sure that both sides of any steps have handrails.  Any raised decks and porches should have guardrails on the  edges.  Have any leaves, snow, or ice cleared regularly.  Use sand or salt on walking paths during winter.  Clean up any spills in your garage right away. This includes oil or grease spills. What can I do in the bathroom?  Use night lights.  Install grab bars by the toilet and in the tub and shower. Do not use towel bars as grab bars.  Use non-skid mats or decals in the tub or shower.  If you need to sit down in the shower, use a plastic, non-slip stool.  Keep the floor dry. Clean up any water that spills on the floor as soon as it happens.  Remove soap buildup in the tub or shower regularly.  Attach bath mats securely with double-sided non-slip rug tape.  Do not have throw rugs and other things on the floor that can make you trip. What can I do in the bedroom?  Use night lights.  Make sure that you have a light by your bed that is easy to reach.  Do not use any sheets or blankets that are too big for your bed. They should not hang down onto the floor.  Have a firm chair that has side arms. You  can use this for support while you get dressed.  Do not have throw rugs and other things on the floor that can make you trip. What can I do in the kitchen?  Clean up any spills right away.  Avoid walking on wet floors.  Keep items that you use a lot in easy-to-reach places.  If you need to reach something above you, use a strong step stool that has a grab bar.  Keep electrical cords out of the way.  Do not use floor polish or wax that makes floors slippery. If you must use wax, use non-skid floor wax.  Do not have throw rugs and other things on the floor that can make you trip. What can I do with my stairs?  Do not leave any items on the stairs.  Make sure that there are handrails on both sides of the stairs and use them. Fix handrails that are broken or loose. Make sure that handrails are as long as the stairways.  Check any carpeting to make sure that it is firmly  attached to the stairs. Fix any carpet that is loose or worn.  Avoid having throw rugs at the top or bottom of the stairs. If you do have throw rugs, attach them to the floor with carpet tape.  Make sure that you have a light switch at the top of the stairs and the bottom of the stairs. If you do not have them, ask someone to add them for you. What else can I do to help prevent falls?  Wear shoes that:  Do not have high heels.  Have rubber bottoms.  Are comfortable and fit you well.  Are closed at the toe. Do not wear sandals.  If you use a stepladder:  Make sure that it is fully opened. Do not climb a closed stepladder.  Make sure that both sides of the stepladder are locked into place.  Ask someone to hold it for you, if possible.  Clearly mark and make sure that you can see:  Any grab bars or handrails.  First and last steps.  Where the edge of each step is.  Use tools that help you move around (mobility aids) if they are needed. These include:  Canes.  Walkers.  Scooters.  Crutches.  Turn on the lights when you go into a dark area. Replace any light bulbs as soon as they burn out.  Set up your furniture so you have a clear path. Avoid moving your furniture around.  If any of your floors are uneven, fix them.  If there are any pets around you, be aware of where they are.  Review your medicines with your doctor. Some medicines can make you feel dizzy. This can increase your chance of falling. Ask your doctor what other things that you can do to help prevent falls. This information is not intended to replace advice given to you by your health care provider. Make sure you discuss any questions you have with your health care provider. Document Released: 01/14/2009 Document Revised: 08/26/2015 Document Reviewed: 04/24/2014  2017 [employer]  Health Maintenance, Female Introduction Adopting a healthy lifestyle and getting preventive care can go a long way to  promote health and wellness. Talk with your health care provider about what schedule of regular examinations is right for you. This is a good chance for you to check in with your provider about disease prevention and staying healthy. In between checkups, there are plenty of things you can do  on your own. Experts have done a lot of research about which lifestyle changes and preventive measures are most likely to keep you healthy. Ask your health care provider for more information. Weight and diet Eat a healthy diet  Be sure to include plenty of vegetables, fruits, low-fat dairy products, and lean protein.  Do not eat a lot of foods high in solid fats, added sugars, or salt.  Get regular exercise. This is one of the most important things you can do for your health.  Most adults should exercise for at least 150 minutes each week. The exercise should increase your heart rate and make you sweat (moderate-intensity exercise).  Most adults should also do strengthening exercises at least twice a week. This is in addition to the moderate-intensity exercise. Maintain a healthy weight  Body mass index (BMI) is a measurement that can be used to identify possible weight problems. It estimates body fat based on height and weight. Your health care provider can help determine your BMI and help you achieve or maintain a healthy weight.  For females 25 years of age and older:  A BMI below 18.5 is considered underweight.  A BMI of 18.5 to 24.9 is normal.  A BMI of 25 to 29.9 is considered overweight.  A BMI of 30 and above is considered obese. Watch levels of cholesterol and blood lipids  You should start having your blood tested for lipids and cholesterol at 76 years of age, then have this test every 5 years.  You may need to have your cholesterol levels checked more often if:  Your lipid or cholesterol levels are high.  You are older than 76 years of age.  You are at high risk for heart  disease. Cancer screening Lung Cancer  Lung cancer screening is recommended for adults 63-47 years old who are at high risk for lung cancer because of a history of smoking.  A yearly low-dose CT scan of the lungs is recommended for people who:  Currently smoke.  Have quit within the past 15 years.  Have at least a 30-pack-year history of smoking. A pack year is smoking an average of one pack of cigarettes a day for 1 year.  Yearly screening should continue until it has been 15 years since you quit.  Yearly screening should stop if you develop a health problem that would prevent you from having lung cancer treatment. Breast Cancer  Practice breast self-awareness. This means understanding how your breasts normally appear and feel.  It also means doing regular breast self-exams. Let your health care provider know about any changes, no matter how small.  If you are in your 20s or 30s, you should have a clinical breast exam (CBE) by a health care provider every 1-3 years as part of a regular health exam.  If you are 12 or older, have a CBE every year. Also consider having a breast X-ray (mammogram) every year.  If you have a family history of breast cancer, talk to your health care provider about genetic screening.  If you are at high risk for breast cancer, talk to your health care provider about having an MRI and a mammogram every year.  Breast cancer gene (BRCA) assessment is recommended for women who have family members with BRCA-related cancers. BRCA-related cancers include:  Breast.  Ovarian.  Tubal.  Peritoneal cancers.  Results of the assessment will determine the need for genetic counseling and BRCA1 and BRCA2 testing. Cervical Cancer  Your health care provider  may recommend that you be screened regularly for cancer of the pelvic organs (ovaries, uterus, and vagina). This screening involves a pelvic examination, including checking for microscopic changes to the surface  of your cervix (Pap test). You may be encouraged to have this screening done every 3 years, beginning at age 85.  For women ages 50-65, health care providers may recommend pelvic exams and Pap testing every 3 years, or they may recommend the Pap and pelvic exam, combined with testing for human papilloma virus (HPV), every 5 years. Some types of HPV increase your risk of cervical cancer. Testing for HPV may also be done on women of any age with unclear Pap test results.  Other health care providers may not recommend any screening for nonpregnant women who are considered low risk for pelvic cancer and who do not have symptoms. Ask your health care provider if a screening pelvic exam is right for you.  If you have had past treatment for cervical cancer or a condition that could lead to cancer, you need Pap tests and screening for cancer for at least 20 years after your treatment. If Pap tests have been discontinued, your risk factors (such as having a new sexual partner) need to be reassessed to determine if screening should resume. Some women have medical problems that increase the chance of getting cervical cancer. In these cases, your health care provider may recommend more frequent screening and Pap tests. Colorectal Cancer  This type of cancer can be detected and often prevented.  Routine colorectal cancer screening usually begins at 76 years of age and continues through 76 years of age.  Your health care provider may recommend screening at an earlier age if you have risk factors for colon cancer.  Your health care provider may also recommend using home test kits to check for hidden blood in the stool.  A small camera at the end of a tube can be used to examine your colon directly (sigmoidoscopy or colonoscopy). This is done to check for the earliest forms of colorectal cancer.  Routine screening usually begins at age 47.  Direct examination of the colon should be repeated every 5-10 years  through 76 years of age. However, you may need to be screened more often if early forms of precancerous polyps or small growths are found. Skin Cancer  Check your skin from head to toe regularly.  Tell your health care provider about any new moles or changes in moles, especially if there is a change in a mole's shape or color.  Also tell your health care provider if you have a mole that is larger than the size of a pencil eraser.  Always use sunscreen. Apply sunscreen liberally and repeatedly throughout the day.  Protect yourself by wearing long sleeves, pants, a wide-brimmed hat, and sunglasses whenever you are outside. Heart disease, diabetes, and high blood pressure  High blood pressure causes heart disease and increases the risk of stroke. High blood pressure is more likely to develop in:  People who have blood pressure in the high end of the normal range (130-139/85-89 mm Hg).  People who are overweight or obese.  People who are African American.  If you are 51-59 years of age, have your blood pressure checked every 3-5 years. If you are 15 years of age or older, have your blood pressure checked every year. You should have your blood pressure measured twice-once when you are at a hospital or clinic, and once when you are not at  a hospital or clinic. Record the average of the two measurements. To check your blood pressure when you are not at a hospital or clinic, you can use:  An automated blood pressure machine at a pharmacy.  A home blood pressure monitor.  If you are between 69 years and 64 years old, ask your health care provider if you should take aspirin to prevent strokes.  Have regular diabetes screenings. This involves taking a blood sample to check your fasting blood sugar level.  If you are at a normal weight and have a low risk for diabetes, have this test once every three years after 76 years of age.  If you are overweight and have a high risk for diabetes, consider  being tested at a younger age or more often. Preventing infection Hepatitis B  If you have a higher risk for hepatitis B, you should be screened for this virus. You are considered at high risk for hepatitis B if:  You were born in a country where hepatitis B is common. Ask your health care provider which countries are considered high risk.  Your parents were born in a high-risk country, and you have not been immunized against hepatitis B (hepatitis B vaccine).  You have HIV or AIDS.  You use needles to inject street drugs.  You live with someone who has hepatitis B.  You have had sex with someone who has hepatitis B.  You get hemodialysis treatment.  You take certain medicines for conditions, including cancer, organ transplantation, and autoimmune conditions. Hepatitis C  Blood testing is recommended for:  Everyone born from [birth year] through 1966.  Anyone with known risk factors for hepatitis C. Sexually transmitted infections (STIs)  You should be screened for sexually transmitted infections (STIs) including gonorrhea and chlamydia if:  You are sexually active and are younger than 76 years of age.  You are older than 76 years of age and your health care provider tells you that you are at risk for this type of infection.  Your sexual activity has changed since you were last screened and you are at an increased risk for chlamydia or gonorrhea. Ask your health care provider if you are at risk.  If you do not have HIV, but are at risk, it may be recommended that you take a prescription medicine daily to prevent HIV infection. This is called pre-exposure prophylaxis (PrEP). You are considered at risk if:  You are sexually active and do not regularly use condoms or know the HIV status of your partner(s).  You take drugs by injection.  You are sexually active with a partner who has HIV. Talk with your health care provider about whether you are at high risk of being infected with  HIV. If you choose to begin PrEP, you should first be tested for HIV. You should then be tested every 3 months for as long as you are taking PrEP. Pregnancy  If you are premenopausal and you may become pregnant, ask your health care provider about preconception counseling.  If you may become pregnant, take 400 to 800 micrograms (mcg) of folic acid every day.  If you want to prevent pregnancy, talk to your health care provider about birth control (contraception). Osteoporosis and menopause  Osteoporosis is a disease in which the bones lose minerals and strength with aging. This can result in serious bone fractures. Your risk for osteoporosis can be identified using a bone density scan.  If you are 13 years of age or older, or  if you are at risk for osteoporosis and fractures, ask your health care provider if you should be screened.  Ask your health care provider whether you should take a calcium or vitamin D supplement to lower your risk for osteoporosis.  Menopause may have certain physical symptoms and risks.  Hormone replacement therapy may reduce some of these symptoms and risks. Talk to your health care provider about whether hormone replacement therapy is right for you. Follow these instructions at home:  Schedule regular health, dental, and eye exams.  Stay current with your immunizations.  Do not use any tobacco products including cigarettes, chewing tobacco, or electronic cigarettes.  If you are pregnant, do not drink alcohol.  If you are breastfeeding, limit how much and how often you drink alcohol.  Limit alcohol intake to no more than 1 drink per day for nonpregnant women. One drink equals 12 ounces of beer, 5 ounces of wine, or 1 ounces of hard liquor.  Do not use street drugs.  Do not share needles.  Ask your health care provider for help if you need support or information about quitting drugs.  Tell your health care provider if you often feel depressed.  Tell  your health care provider if you have ever been abused or do not feel safe at home. This information is not intended to replace advice given to you by your health care provider. Make sure you discuss any questions you have with your health care provider. Document Released: 10/03/2010 Document Revised: 08/26/2015 Document Reviewed: 12/22/2014  2017 [employer]

## 2016-03-23 NOTE — Progress Notes (Signed)
I have reviewed documentation from this visit and I agree with recommendations given.  Betty G. Jordan, MD  [facility]. [place] office.   

## 2016-06-03 ENCOUNTER — Other Ambulatory Visit: Payer: Self-pay | Admitting: Family Medicine

## 2016-06-03 DIAGNOSIS — I10 Essential (primary) hypertension: Secondary | ICD-10-CM

## 2016-07-01 ENCOUNTER — Other Ambulatory Visit: Payer: Self-pay | Admitting: Family Medicine

## 2016-07-01 DIAGNOSIS — I10 Essential (primary) hypertension: Secondary | ICD-10-CM

## 2016-09-01 ENCOUNTER — Other Ambulatory Visit: Payer: Self-pay | Admitting: Family Medicine

## 2016-09-01 DIAGNOSIS — E7800 Pure hypercholesterolemia, unspecified: Secondary | ICD-10-CM

## 2016-09-18 NOTE — Progress Notes (Signed)
HPI:   [name] is a 76 y.o. female, who is here today to follow on some chronic medical problems.  Last seen 10/12/15, 3-4 months f/u recommended then because elevated BP and bradycardia.  Hypertension:  Dx over 5 years ago. Currently on Maxzide 37.5-25 mg and Atenolol 25 mg daily.  BP readings at home: Not checking it. She does not use "a lot of salt" for food preparation.  She has has not noted headache, visual changes, exertional chest pain, dyspnea,  focal weakness, or edema.   Lab Results  Component Value Date   CREATININE 0.81 10/12/2015   BUN 25 (H) 10/12/2015   NA 138 10/12/2015   K 4.3 10/12/2015   CL 99 10/12/2015   CO2 31 10/12/2015    GERD: [place] food with green sauce exacerbate symptoms, so she takes medication when planning on eating this type of foof or after. Spicy food does not cause symptoms. She has acid reflux when bending down.   Denies abdominal pain, nausea, vomiting, changes in bowel habits, blood in stool or melena.  -She would like me to check lesions on her back. She has had these for years but cannot check lesions her self. She denies tenderness,easy bleeding, or other symptom. No prior Hx of skin cancer.  HLD: She also would like FLP done. She is currently on Zocor 10 mg daily. Tolerating med well. Denies Hx of CVD. 10/2015 TC 163,TG 124, HDL 52,LDL 86.   Review of Systems  Constitutional: Negative for activity change, appetite change, fatigue, fever and unexpected weight change.  HENT: Negative for mouth sores, nosebleeds and trouble swallowing.   Eyes: Negative for redness and visual disturbance.  Respiratory: Negative for cough, shortness of breath and wheezing.   Cardiovascular: Negative for chest pain, palpitations and leg swelling.  Gastrointestinal: Negative for abdominal pain, nausea and vomiting.       Negative for changes in bowel habits.  Genitourinary: Negative for decreased urine volume and  hematuria.  Musculoskeletal: Negative for gait problem and myalgias.  Neurological: Negative for dizziness, syncope, weakness and headaches.  Psychiatric/Behavioral: Negative for confusion. The patient is not nervous/anxious.       Current Outpatient Prescriptions on File Prior to Visit  Medication Sig Dispense Refill  . alendronate (FOSAMAX) 70 MG tablet TAKE 1 TABLET(S) EVERY WEEK BY ORAL ROUTE 13 tablet 3  . aspirin 325 MG EC tablet Take 325 mg by mouth daily.    . simvastatin (ZOCOR) 10 MG tablet TAKE 1 TABLET (10 MG TOTAL) BY MOUTH AT BEDTIME. 90 tablet 0  . triamterene-hydrochlorothiazide (MAXZIDE-25) 37.5-25 MG tablet TAKE 1 TABLET BY MOUTH DAILY. 90 tablet 0   No current facility-administered medications on file prior to visit.      Past Medical History:  Diagnosis Date  . High cholesterol   . Hypertension   . Osteoarthritis   . Raynaud disease    Allergies  Allergen Reactions  . Ibuprofen   . Prednisone     oral    Social History   Social History  . Marital status: Married    Spouse name: N/A  . Number of children: N/A  . Years of education: N/A   Social History Main Topics  . Smoking status: Never Smoker  . Smokeless tobacco: Never Used  . Alcohol use Yes  . Drug use: No  . Sexual activity: Not Asked   Other Topics Concern  . None   Social History Narrative  . None  Vitals:   09/19/16 1013  BP: (!) 148/80  Pulse: (!) 55  Resp: 12  O2 sat at RA 96% Body mass index is 25.93 kg/m.   Physical Exam  Nursing note and vitals reviewed. Constitutional: She is oriented to person, place, and time. She appears well-developed and well-nourished. No distress.  HENT:  Head: Atraumatic.  Mouth/Throat: Oropharynx is clear and moist and mucous membranes are normal.  Eyes: Conjunctivae and EOM are normal. Pupils are equal, round, and reactive to light.  Neck: No tracheal deviation present. Thyromegaly present.  Cardiovascular: Regular rhythm.   Bradycardia present.   No murmur heard. Pulses:      Dorsalis pedis pulses are 2+ on the right side, and 2+ on the left side.  Respiratory: Effort normal and breath sounds normal. No respiratory distress.  GI: Soft. She exhibits no mass. There is no hepatomegaly. There is no tenderness.  Musculoskeletal: She exhibits edema (Trace pitting edema LE, bilateral.).  Lymphadenopathy:    She has no cervical adenopathy.  Neurological: She is alert and oriented to person, place, and time. She has normal strength. Coordination normal.  Skin: Skin is warm. No rash noted. No erythema.     Scattered hyperpigmented lesion, mildly raised, no tender, on upper back,some on abdomen. One on mid back with uneven pigmentation, 3-4 mm, rounded, regular borders.  Psychiatric: She has a normal mood and affect.  Well groomed, good eye contact.     ASSESSMENT AND PLAN:   Molly Wilcox was seen today for follow-up.  Diagnoses and all orders for this visit:  Lab Results  Component Value Date   TSH 1.48 09/19/2016   Lab Results  Component Value Date   CHOL 144 09/19/2016   HDL 50.20 09/19/2016   LDLCALC 73 09/19/2016   TRIG 105.0 09/19/2016   CHOLHDL 3 09/19/2016     Chemistry      Component Value Date/Time   NA 140 09/19/2016 1128   NA 139 04/03/2015   K 3.7 09/19/2016 1128   CL 100 09/19/2016 1128   CO2 30 09/19/2016 1128   BUN 27 (H) 09/19/2016 1128   BUN 23 (A) 04/03/2015   CREATININE 0.85 09/19/2016 1128   GLU 102 04/03/2015      Component Value Date/Time   CALCIUM 9.8 09/19/2016 1128   ALKPHOS 51 09/19/2016 1128   AST 21 09/19/2016 1128   ALT 21 09/19/2016 1128   BILITOT 0.7 09/19/2016 1128      Essential hypertension  Re-checked 152/80. Not well controlled. Possible complications of elevated BP discussed. After discussion of some side effects she agrees with trying Amlodipine low dose, 2.5 mg. No changes in Maxzide. Decrease Atenolol to 12.5 mg. Low salt diet. Annual eye  examination. F/U in 6-8 weeks.  -     atenolol (TENORMIN) 25 MG tablet; Take 0.5 tablets (12.5 mg total) by mouth daily. -     EKG 12-Lead -     amLODipine (NORVASC) 2.5 MG tablet; Take 1 tablet (2.5 mg total) by mouth daily. -     Comprehensive metabolic panel  Bradycardia  Asymptomatic. EKG today: Sinus bradycardia, ? LAE,unsp T wave abn on ant leads and DIII, normal axis. No other EKG available for comparison. Decrease Atenolol from 25 mg to 12.5 mg daily, will wean off. Instructed to monitor HR at home. Instructed about warning signs.  -     EKG 12-Lead  Pure hypercholesterolemia No changes in current management, will follow labs done today and will give further recommendations accordingly.  Low fat diet to continue. Educated about risk of interaction between Zocor and CCB as Amlodipine, but both on low dose,so no changes for now.  -     Lipid panel  Enlarged thyroid gland  Further recommendations will be given according to labs/imaging results.  -     TSH -     US THYROID; Future  Keratosis, seborrheic  Reassured, no suspicious lesions.  Educated about SD. Still we will continue monitoring.     -[name] was advised to return sooner than planned today if new concerns arise.       Amish Mintzer G. Martinique, MD  [facility] [place] office.

## 2016-09-19 ENCOUNTER — Ambulatory Visit (INDEPENDENT_AMBULATORY_CARE_PROVIDER_SITE_OTHER): Payer: PPO | Admitting: Family Medicine

## 2016-09-19 ENCOUNTER — Encounter: Payer: Self-pay | Admitting: Family Medicine

## 2016-09-19 VITALS — BP 148/80 | HR 55 | Resp 12 | Ht 63.0 in | Wt 146.4 lb

## 2016-09-19 DIAGNOSIS — L821 Other seborrheic keratosis: Secondary | ICD-10-CM

## 2016-09-19 DIAGNOSIS — E049 Nontoxic goiter, unspecified: Secondary | ICD-10-CM | POA: Diagnosis not present

## 2016-09-19 DIAGNOSIS — E7800 Pure hypercholesterolemia, unspecified: Secondary | ICD-10-CM | POA: Diagnosis not present

## 2016-09-19 DIAGNOSIS — I10 Essential (primary) hypertension: Secondary | ICD-10-CM | POA: Diagnosis not present

## 2016-09-19 DIAGNOSIS — R001 Bradycardia, unspecified: Secondary | ICD-10-CM | POA: Diagnosis not present

## 2016-09-19 LAB — COMPREHENSIVE METABOLIC PANEL
ALK PHOS: 51 U/L (ref 39–117)
ALT: 21 U/L (ref 0–35)
AST: 21 U/L (ref 0–37)
Albumin: 4.8 g/dL (ref 3.5–5.2)
BUN: 27 mg/dL — ABNORMAL HIGH (ref 6–23)
CO2: 30 mEq/L (ref 19–32)
Calcium: 9.8 mg/dL (ref 8.4–10.5)
Chloride: 100 mEq/L (ref 96–112)
Creatinine, Ser: 0.85 mg/dL (ref 0.40–1.20)
GFR: 68.98 mL/min (ref >60.00)
Glucose, Bld: 113 mg/dL — ABNORMAL HIGH (ref 70–99)
POTASSIUM: 3.7 meq/L (ref 3.5–5.1)
Sodium: 140 mEq/L (ref 135–145)
TOTAL PROTEIN: 7 g/dL (ref 6.0–8.3)
Total Bilirubin: 0.7 mg/dL (ref 0.2–1.2)

## 2016-09-19 LAB — LIPID PANEL
CHOLESTEROL: 144 mg/dL (ref 0–200)
HDL: 50.2 mg/dL (ref >39.00)
LDL CALC: 73 mg/dL (ref 0–99)
NonHDL: 93.55
Total CHOL/HDL Ratio: 3
Triglycerides: 105 mg/dL (ref 0.0–149.0)
VLDL: 21 mg/dL (ref 0.0–40.0)

## 2016-09-19 LAB — TSH: TSH: 1.48 u[IU]/mL (ref 0.35–4.50)

## 2016-09-19 MED ORDER — AMLODIPINE BESYLATE 2.5 MG PO TABS: 2.5000 mg | ORAL_TABLET | Freq: Every day | ORAL | 2 refills | Status: DC

## 2016-09-19 MED ORDER — ATENOLOL 25 MG PO TABS: 12.5000 mg | ORAL_TABLET | Freq: Every day | ORAL | 2 refills | Status: DC

## 2016-09-19 NOTE — Patient Instructions (Signed)
A few things to remember from today's visit:   Essential hypertension - Plan: atenolol (TENORMIN) 25 MG tablet, EKG 12-Lead, amLODipine (NORVASC) 2.5 MG tablet  Bradycardia - Plan: EKG 12-Lead  Pure hypercholesterolemia  Atenolol decreased and Amlodipine 2.5 mg added. No changes in Zocor or Mazxide.  BP check at home and pulse.  Please be sure medication list is accurate. If a new problem present, please set up appointment sooner than planned today.

## 2016-09-21 ENCOUNTER — Encounter: Payer: Self-pay | Admitting: Family Medicine

## 2016-09-22 ENCOUNTER — Encounter: Payer: Self-pay | Admitting: Family Medicine

## 2016-09-23 ENCOUNTER — Encounter: Payer: Self-pay | Admitting: Family Medicine

## 2016-09-27 ENCOUNTER — Ambulatory Visit (HOSPITAL_COMMUNITY)
Admission: EM | Admit: 2016-09-27 | Discharge: 2016-09-27 | Disposition: A | Discharge status: Home / Self Care | Payer: PPO | Attending: Family Medicine | Admitting: Family Medicine

## 2016-09-27 ENCOUNTER — Encounter (HOSPITAL_COMMUNITY): Payer: Self-pay | Admitting: Emergency Medicine

## 2016-09-27 DIAGNOSIS — S61210S Laceration without foreign body of right index finger without damage to nail, sequela: Secondary | ICD-10-CM | POA: Diagnosis not present

## 2016-09-27 DIAGNOSIS — Z23 Encounter for immunization: Secondary | ICD-10-CM | POA: Diagnosis not present

## 2016-09-27 DIAGNOSIS — S61212A Laceration without foreign body of right middle finger without damage to nail, initial encounter: Secondary | ICD-10-CM | POA: Diagnosis not present

## 2016-09-27 DIAGNOSIS — W268XXA Contact with other sharp object(s), not elsewhere classified, initial encounter: Secondary | ICD-10-CM

## 2016-09-27 MED ORDER — TETANUS-DIPHTH-ACELL PERTUSSIS 5-2.5-18.5 LF-MCG/0.5 IM SUSP
0.5000 mL | Freq: Once | INTRAMUSCULAR | Status: AC
  Administered 2016-09-27: 0.5 mL via INTRAMUSCULAR

## 2016-09-27 MED ORDER — TETANUS-DIPHTH-ACELL PERTUSSIS 5-2.5-18.5 LF-MCG/0.5 IM SUSP
INTRAMUSCULAR | Status: AC
Start: 2016-09-27 — End: 2016-09-27
  Filled 2016-09-27: qty 0.5

## 2016-09-27 NOTE — ED Triage Notes (Signed)
The patient presented to the [facility] with a complaint of a laceration to the 3rd finger of her right hand that occurred today with a piece of plastic.

## 2016-09-27 NOTE — ED Provider Notes (Signed)
CSN: [id number]     Arrival date & time 09/27/16  1254 History   None    Chief Complaint  Patient presents with  . Laceration   (Consider location/radiation/quality/duration/timing/severity/associated sxs/prior Treatment) Patient cut her right middle finger on some plastic in the refriderator   The history is provided by the patient.  Laceration  Location:  Finger Finger laceration location:  R middle finger Length:  1 cm Depth:  Cutaneous Quality: straight   Bleeding: venous   Time since incident:  1 hour Laceration mechanism:  Blunt object Pain details:    Quality:  Aching   Severity:  Moderate   Past Medical History:  Diagnosis Date  . High cholesterol   . Hypertension   . Osteoarthritis   . Raynaud disease    Past Surgical History:  Procedure Laterality Date  . TUBAL LIGATION     History reviewed. No pertinent family history. Social History  Substance Use Topics  . Smoking status: Never Smoker  . Smokeless tobacco: Never Used  . Alcohol use Yes   OB History    No data available     Review of Systems  Constitutional: Negative.   HENT: Negative.   Eyes: Negative.   Respiratory: Negative.   Endocrine: Negative.   Genitourinary: Negative.   Allergic/Immunologic: Negative.   Neurological: Negative.     Allergies  Ibuprofen and Prednisone  Home Medications   Prior to Admission medications   Medication Sig Start Date End Date Taking? Authorizing Provider  alendronate (FOSAMAX) 70 MG tablet TAKE 1 TABLET(S) EVERY WEEK BY ORAL ROUTE 10/12/15   Martinique, Betty G, MD  amLODipine (NORVASC) 2.5 MG tablet Take 1 tablet (2.5 mg total) by mouth daily. 09/19/16   Martinique, Betty G, MD  aspirin 325 MG EC tablet Take 81 mg by mouth daily.     [provider]  atenolol (TENORMIN) 25 MG tablet Take 0.5 tablets (12.5 mg total) by mouth daily. 09/19/16   Martin[place], Betty G, MD  Ca Phosphate-Cholecalciferol (CALCIUM/VITAMIN D3 GUMMIES PO) Take 2 gummies by mouth  daily.    [provider]  ranitidine (ZANTAC) 150 MG tablet Take 150 mg by mouth as needed for heartburn.    [provider]  simvastatin (ZOCOR) 10 MG tablet TAKE 1 TABLET (10 MG TOTAL) BY MOUTH AT BEDTIME. 09/01/16   Martinique, Betty G, MD  triamterene-hydrochlorothiazide [facility]) 37.5-25 MG tablet TAKE 1 TABLET BY MOUTH DAILY. 07/03/16   Martinique, Betty G, MD   Meds Ordered and Administered this Visit   Medications  Tdap (BOOSTRIX) injection 0.5 mL (0.5 mLs Intramuscular Given 09/27/16 1341)    BP (!) 178/71 (BP Location: Left Arm)   Pulse 79   Temp 98.3 F (36.8 C) (Oral)   Resp 18   SpO2 98%  No data found.   Physical Exam  Constitutional: She appears well-developed and well-nourished.  HENT:  Head: Normocephalic and atraumatic.  Eyes: Conjunctivae and EOM are normal. Pupils are equal, round, and reactive to light.  Neck: Normal range of motion. Neck supple.  Cardiovascular: Normal rate, regular rhythm and normal heart sounds.   Pulmonary/Chest: Effort normal and breath sounds normal.  Skin:  1 cm laceration right middle finger.  Nursing note and vitals reviewed.   Urgent Care Course     .[name] KitchenLaceration Repair Date/Time: 09/27/2016 2:23 PM Performed by: Lysbeth Penner Authorized by: Vanessa Kick   Consent:    Consent obtained:  Verbal   Consent given by:  Patient   Risks  discussed:  Infection and pain   Alternatives discussed:  No treatment Anesthesia (see MAR for exact dosages):    Anesthesia method:  None Laceration details:    Location:  Finger   Finger location:  R long finger   Length (cm):  1   Depth (mm):  2 Repair type:    Repair type:  Simple Pre-procedure details:    Preparation:  Patient was prepped and draped in usual sterile fashion Exploration:    Hemostasis achieved with:  Direct pressure   Wound extent: areolar tissue violated     Contaminated: no   Treatment:    Area cleansed with:  Saline   Amount of cleaning:   Standard   Visualized foreign bodies/material removed: no   Skin repair:    Repair method:  Sutures   Suture size:  4-0   Suture material:  Nylon   Number of sutures:  3 Approximation:    Approximation:  Close   Vermilion border: well-aligned   Post-procedure details:    Dressing:  Adhesive bandage   (including critical care time)  Labs Review Labs Reviewed - No data to display  Imaging Review No results found.   Visual Acuity Review  Right Eye Distance:   Left Eye Distance:   Bilateral Distance:    Right Eye Near:   Left Eye Near:    Bilateral Near:         MDM   1. Laceration of right index finger without foreign body without damage to nail, sequela    Follow up in 7 days for suture removal  #3 4.0 nylon sutures placed.      Lysbeth Penner, [place] 09/27/16 1424

## 2016-09-27 NOTE — Discharge Instructions (Signed)
Follow up with pcp or with this clinic in 7 days for suture removal

## 2016-09-28 ENCOUNTER — Encounter: Payer: Self-pay | Admitting: Family Medicine

## 2016-09-29 ENCOUNTER — Ambulatory Visit
Admission: RE | Admit: 2016-09-29 | Discharge: 2016-09-29 | Disposition: A | Discharge status: Home / Self Care | Payer: PPO | Source: Ambulatory Visit | Attending: Family Medicine | Admitting: Family Medicine

## 2016-09-29 DIAGNOSIS — E049 Nontoxic goiter, unspecified: Secondary | ICD-10-CM | POA: Diagnosis not present

## 2016-10-08 ENCOUNTER — Encounter: Payer: Self-pay | Admitting: Family Medicine

## 2016-10-10 ENCOUNTER — Encounter: Payer: Self-pay | Admitting: Family Medicine

## 2016-10-10 ENCOUNTER — Other Ambulatory Visit: Payer: Self-pay | Admitting: Family Medicine

## 2016-10-10 DIAGNOSIS — E041 Nontoxic single thyroid nodule: Secondary | ICD-10-CM

## 2016-10-11 ENCOUNTER — Encounter: Payer: Self-pay | Admitting: Family Medicine

## 2016-10-16 ENCOUNTER — Telehealth: Payer: Self-pay | Admitting: Family Medicine

## 2016-10-16 NOTE — Telephone Encounter (Signed)
Patient checking on status of referral. Please update.  Thank you,  -LL

## 2016-10-28 ENCOUNTER — Other Ambulatory Visit: Payer: Self-pay | Admitting: Family Medicine

## 2016-10-28 DIAGNOSIS — I10 Essential (primary) hypertension: Secondary | ICD-10-CM

## 2016-11-20 NOTE — Progress Notes (Signed)
[name] is a 76 y.o.female, who is here today to follow on HTN. She is also requesting a CPE today, states that her insurance covers one annually.   She has seen on 09/19/16, when mild bradycardia noted,so Atenolol decreased from 25 mg to 12.5 mg. Amlodipine 2.5 mg added. She is also on Maxzide 37.5-25 mg daily.  Home BP readings: Not checking. Last eye exam within the last year.  She has checked HR and 60-75/min.  She is taking medications as instructed, no side effects reported.  She has not noted unusual headache, visual changes, exertional chest pain, dyspnea,  focal weakness, or edema.    Lab Results  Component Value Date   CREATININE 0.85 09/19/2016   BUN 27 (H) 09/19/2016   NA 140 09/19/2016   K 3.7 09/19/2016   CL 100 09/19/2016   CO2 30 09/19/2016   She lives with her husband, who has MS, she is his caregiver.She does not have somebody to help her with her husband's care.  HLD on Zocor 10 mg.  Lab Results  Component Value Date   CHOL 144 09/19/2016   HDL 50.20 09/19/2016   LDLCALC 73 09/19/2016   TRIG 105.0 09/19/2016   CHOLHDL 3 09/19/2016    Osteoporosis on Fosamax.  Tolerating medications well , no side effects reported.  Mammogram 02/2016: Birads 2. DEXA 02/2015:Osteoporosis.  Colonoscopy 05/2013.  Immunization History  Administered Date(s) Administered  . Influenza, High Dose Seasonal PF 01/02/2016  . Pneumococcal Conjugate-13 03/30/2014  . Pneumococcal Polysaccharide-23 12/29/2014  . Tdap 09/27/2016     Review of Systems  Constitutional: Negative for activity change, appetite change, fatigue, fever and unexpected weight change.  HENT: Negative for mouth sores, nosebleeds and trouble swallowing.   Eyes: Negative for redness and visual disturbance.  Respiratory: Negative for cough, shortness of breath and wheezing.   Cardiovascular: Negative for chest pain, palpitations and leg swelling.  Gastrointestinal: Negative for  abdominal pain, nausea and vomiting.       Negative for changes in bowel habits.  Endocrine: Negative for cold intolerance, heat intolerance, polydipsia, polyphagia and polyuria.  Genitourinary: Negative for decreased urine volume, dysuria and hematuria.  Musculoskeletal: Positive for arthralgias (stable). Negative for gait problem and myalgias.  Skin: Negative for rash and wound.  Neurological: Negative for syncope, weakness and headaches.  Hematological: Negative for adenopathy. Does not bruise/bleed easily.  Psychiatric/Behavioral: Negative for confusion. The patient is not nervous/anxious.   All other systems reviewed and are negative.    Current Outpatient Prescriptions on File Prior to Visit  Medication Sig Dispense Refill  . alendronate (FOSAMAX) 70 MG tablet TAKE 1 TABLET(S) EVERY WEEK BY ORAL ROUTE 13 tablet 3  . Ca Phosphate-Cholecalciferol (CALCIUM/VITAMIN D3 GUMMIES PO) Take 2 gummies by mouth daily.    . ranitidine (ZANTAC) 150 MG tablet Take 150 mg by mouth as needed for heartburn.    . simvastatin (ZOCOR) 10 MG tablet TAKE 1 TABLET (10 MG TOTAL) BY MOUTH AT BEDTIME. 90 tablet 0  . triamterene-hydrochlorothiazide (MAXZIDE-25) 37.5-25 MG tablet TAKE 1 TABLET BY MOUTH EVERY DAY 90 tablet 1   No current facility-administered medications on file prior to visit.      Past Medical History:  Diagnosis Date  . High cholesterol   . Hypertension   . Osteoarthritis   . Raynaud disease    Past Surgical History:  Procedure Laterality Date  . TUBAL LIGATION       Allergies  Allergen Reactions  . Ibuprofen   .  Prednisone     oral   Family History  Problem Relation Age of Onset  . Heart disease Father     Social History   Social History  . Marital status: Married    Spouse name: N/A  . Number of children: N/A  . Years of education: N/A   Social History Main Topics  . Smoking status: Never Smoker  . Smokeless tobacco: Never Used  . Alcohol use Yes  . Drug  use: No  . Sexual activity: Not Currently   Other Topics Concern  . None   Social History Narrative  . None    Vitals:   11/21/16 0944  BP: 120/74  Pulse: 69  Resp: 12  SpO2: 96%   Body mass index is 25.86 kg/m.   Wt Readings from Last 3 Encounters:  11/21/16 146 lb (66.2 kg)  09/19/16 146 lb 6 oz (66.4 kg)  03/21/16 145 lb 2 oz (65.8 kg)     Physical Exam  Nursing note and vitals reviewed. Constitutional: She is oriented to person, place, and time. She appears well-developed and well-nourished. No distress.  HENT:  Head: Normocephalic and atraumatic.  Right Ear: Tympanic membrane, external ear and ear canal normal.  Left Ear: Tympanic membrane, external ear and ear canal normal.  Mouth/Throat: Oropharynx is clear and moist and mucous membranes are normal.  Eyes: Pupils are equal, round, and reactive to light. Conjunctivae and EOM are normal.  Cardiovascular: Normal rate and regular rhythm.   No murmur heard. Pulses:      Dorsalis pedis pulses are 2+ on the right side, and 2+ on the left side.  Respiratory: Effort normal and breath sounds normal. No respiratory distress.  GI: Soft. She exhibits no mass. There is no hepatomegaly. There is no tenderness.  Musculoskeletal: She exhibits no edema or tenderness.  Lymphadenopathy:    She has no cervical adenopathy.  Neurological: She is alert and oriented to person, place, and time. She has normal strength. No cranial nerve deficit. Coordination and gait normal.  Reflex Scores:      Bicep reflexes are 2+ on the right side and 2+ on the left side.      Patellar reflexes are 2+ on the right side and 2+ on the left side. Skin: Skin is warm. No erythema.  Psychiatric: She has a normal mood and affect.  Well groomed, good eye contact.    ASSESSMENT AND PLAN:   [name] was seen today for follow-up and annual exam.  Diagnoses and all orders for this visit:  Routine general medical examination at a health care  facility  We discussed the importance of regular physical activity and healthy diet for prevention of chronic illness and/or complications. Preventive guidelines reviewed. Vaccination up to date. Aspirin for primary prevention discussed, she is tolerating well, side effects discussed. Ca++ and vit D supplementation recommended. Next CPE in 1 year.   Essential hypertension  Adequately controlled. Bradycardia resolved.  No changes in current management. DASH-low salt diet to continue. Eye exam recommended annually. F/U in 6 months, before if needed.  -     atenolol (TENORMIN) 25 MG tablet; Take 0.5 tablets (12.5 mg total) by mouth daily. -     amLODipine (NORVASC) 2.5 MG tablet; Take 1 tablet (2.5 mg total) by mouth daily.  Hyperglycemia  Primary prevention through healthy life style.  Lab Results  Component Value Date   HGBA1C 5.9 11/21/2016    -     POCT glycosylated hemoglobin (Hb A1C)      -  Molly Wilcox advised to return sooner than planned today if new concerns arise.     Molly G. Martinique, MD  [facility]. [place] office.

## 2016-11-21 ENCOUNTER — Ambulatory Visit (INDEPENDENT_AMBULATORY_CARE_PROVIDER_SITE_OTHER): Payer: PPO | Admitting: Family Medicine

## 2016-11-21 ENCOUNTER — Encounter: Payer: Self-pay | Admitting: Family Medicine

## 2016-11-21 VITALS — BP 120/74 | HR 69 | Resp 12 | Ht 63.0 in | Wt 146.0 lb

## 2016-11-21 DIAGNOSIS — R739 Hyperglycemia, unspecified: Secondary | ICD-10-CM

## 2016-11-21 DIAGNOSIS — I10 Essential (primary) hypertension: Secondary | ICD-10-CM | POA: Diagnosis not present

## 2016-11-21 DIAGNOSIS — Z0000 Encounter for general adult medical examination without abnormal findings: Secondary | ICD-10-CM

## 2016-11-21 LAB — POCT GLYCOSYLATED HEMOGLOBIN (HGB A1C): Hemoglobin A1C: 5.9

## 2016-11-21 MED ORDER — ATENOLOL 25 MG PO TABS: 12.5000 mg | ORAL_TABLET | Freq: Every day | ORAL | 1 refills | Status: DC

## 2016-11-21 MED ORDER — AMLODIPINE BESYLATE 2.5 MG PO TABS: 2.5000 mg | ORAL_TABLET | Freq: Every day | ORAL | 2 refills | Status: DC

## 2016-11-21 NOTE — Patient Instructions (Addendum)
A few things to remember from today's visit:   Essential hypertension  Bradycardia  Hyperglycemia - Plan: POCT glycosylated hemoglobin (Hb A1C)  Routine general medical examination at a health care facility  A few tips:  -As we age balance is not as good as it was, so there is a higher risks for falls. Please remove small rugs and furniture that is "in your way" and could increase the risk of falls. Stretching exercises may help with fall prevention: Yoga and Tai Chi are some examples. Low impact exercise is better, so you are not very achy the next day.  -Sun screen and avoidance of direct sun light recommended. Caution with dehydration, if working outdoors be sure to drink enough fluids.  - Some medications are not safe as we age, increases the risk of side effects and can potentially interact with other medication you are also taken;  including some of over the counter medications. Be sure to let me know when you start a new medication even if it is a dietary/vitamin supplement.   -Healthy diet low in red meet/animal fat and sugar + regular physical activity is recommended.     Please be sure medication list is accurate. If a new problem present, please set up appointment sooner than planned today.

## 2016-11-23 ENCOUNTER — Encounter: Payer: Self-pay | Admitting: Family Medicine

## 2016-11-29 ENCOUNTER — Other Ambulatory Visit: Payer: Self-pay | Admitting: Family Medicine

## 2016-11-29 DIAGNOSIS — E7800 Pure hypercholesterolemia, unspecified: Secondary | ICD-10-CM

## 2016-12-05 ENCOUNTER — Other Ambulatory Visit: Payer: Self-pay | Admitting: Family Medicine

## 2016-12-05 DIAGNOSIS — M810 Age-related osteoporosis without current pathological fracture: Secondary | ICD-10-CM

## 2016-12-06 ENCOUNTER — Other Ambulatory Visit (HOSPITAL_COMMUNITY)
Admission: RE | Admit: 2016-12-06 | Discharge: 2016-12-06 | Disposition: A | Discharge status: Home / Self Care | Payer: PPO | Source: Ambulatory Visit | Attending: Endocrinology | Admitting: Endocrinology

## 2016-12-06 ENCOUNTER — Ambulatory Visit (INDEPENDENT_AMBULATORY_CARE_PROVIDER_SITE_OTHER): Payer: PPO | Admitting: Endocrinology

## 2016-12-06 ENCOUNTER — Encounter: Payer: Self-pay | Admitting: Endocrinology

## 2016-12-06 DIAGNOSIS — E042 Nontoxic multinodular goiter: Secondary | ICD-10-CM | POA: Diagnosis not present

## 2016-12-06 NOTE — Progress Notes (Signed)
Subjective:    Patient ID: Molly Wilcox, female    DOB: June 27, 1940, 76 y.o.   MRN: [id number]  HPI Pt was noted at recent PCP visit to have a thyroid nodule.  She does not notice any swelling at the anterior neck, or assoc pain.   Past Medical History:  Diagnosis Date  . High cholesterol   . Hypertension   . Osteoarthritis   . Raynaud disease     Past Surgical History:  Procedure Laterality Date  . TUBAL LIGATION      Social History   Social History  . Marital status: Married    Spouse name: N/A  . Number of children: N/A  . Years of education: N/A   Occupational History  . Not on file.   Social History Main Topics  . Smoking status: Never Smoker  . Smokeless tobacco: Never Used  . Alcohol use Yes  . Drug use: No  . Sexual activity: Not Currently   Other Topics Concern  . Not on file   Social History Narrative  . No narrative on file    Current Outpatient Prescriptions on File Prior to Visit  Medication Sig Dispense Refill  . alendronate (FOSAMAX) 70 MG tablet TAKE 1 TABLET(S) EVERY WEEK BY ORAL ROUTE 12 tablet 3  . amLODipine (NORVASC) 2.5 MG tablet Take 1 tablet (2.5 mg total) by mouth daily. 90 tablet 2  . aspirin EC 81 MG tablet Take 81 mg by mouth daily.    [name] Kitchen atenolol (TENORMIN) 25 MG tablet Take 0.5 tablets (12.5 mg total) by mouth daily. 45 tablet 1  . Ca Phosphate-Cholecalciferol (CALCIUM/VITAMIN D3 GUMMIES PO) Take 2 gummies by mouth daily.    . ranitidine (ZANTAC) 150 MG tablet Take 150 mg by mouth as needed for heartburn.    . simvastatin (ZOCOR) 10 MG tablet TAKE 1 TABLET BY MOUTH EVERYDAY AT BEDTIME 90 tablet 2  . triamterene-hydrochlorothiazide (MAXZIDE-25) 37.5-25 MG tablet TAKE 1 TABLET BY MOUTH EVERY DAY 90 tablet 1   No current facility-administered medications on file prior to visit.     Allergies  Allergen Reactions  . Prednisone Other (See Comments)    Dehydration, amnesia   . Ibuprofen Nausea Only and Other (See Comments)      Nausea & headaches    Family History  Problem Relation Age of Onset  . Heart disease Father   . Thyroid disease Neg Hx     BP (!) 150/82   Pulse 85   Wt 147 lb 3.2 oz (66.8 kg)   SpO2 99%   BMI 26.08 kg/m    Review of Systems Denies weight change, hoarseness, visual loss, chest pain, sob, cough, dysphagia, diarrhea, itching, flushing, easy bruising, depression, cold intolerance, numbness, and rhinorrhea.  She seldom has headache.     Objective:   Physical Exam VS: see vs page GEN: no distress HEAD: head: no deformity eyes: no periorbital swelling, no proptosis external nose and ears are normal mouth: no lesion seen NECK: left thyroid nodule is palpable above the clavicle.   CHEST WALL: no deformity LUNGS: clear to auscultation CV: reg rate and rhythm, no murmur ABD: abdomen is soft, nontender.  no hepatosplenomegaly.  not distended.  no hernia MUSCULOSKELETAL: muscle bulk and strength are grossly normal.  no obvious joint swelling.  gait is normal and steady EXTEMITIES: no deformity.  no edema PULSES: no carotid bruit NEURO:  cn 2-12 grossly intact.   readily moves all 4's.  sensation is intact to touch  on all 4's SKIN:  Normal texture and temperature.  No rash or suspicious lesion is visible.   NODES:  None palpable at the neck PSYCH: alert, well-oriented.  Does not appear anxious nor depressed.   Lab Results  Component Value Date   TSH 1.48 09/19/2016   [place]: Normal-sized thyroid with bilateral nodules. Recommend FNA biopsy of moderately suspicious 3 cm inferior left nodule. Recommend follow-up 1 year ultrasound of right isthmic nodule and other lesions.  I have reviewed outside records, and summarized:  Pt was noted to have nodular goiter, and referred here.  Wellness, HTN, and hyperglycemia were also addressed.    thyroid needle bx: consent obtained, signed form on chart The area is first sprayed with cooling agent local: xylocaine 2%, with  epinephrine prep: alcohol pad 4 bxs are done with 25 and 46F needles no complications     Assessment & Plan:  multinodular goiter, new to me, uncertain etiology.   Patient Instructions  We'll let you know about the biopsy results. If as expected, no cancer is found, Please come back for a follow-up appointment in 6-12 months.

## 2016-12-06 NOTE — Patient Instructions (Signed)
We'll let you know about the biopsy results. If as expected, no cancer is found, Please come back for a follow-up appointment in 6-12 months. 

## 2016-12-07 DIAGNOSIS — E042 Nontoxic multinodular goiter: Secondary | ICD-10-CM | POA: Diagnosis not present

## 2016-12-21 ENCOUNTER — Encounter: Payer: Self-pay | Admitting: Family Medicine

## 2016-12-28 ENCOUNTER — Encounter: Payer: Self-pay | Admitting: Family Medicine

## 2016-12-28 ENCOUNTER — Ambulatory Visit (INDEPENDENT_AMBULATORY_CARE_PROVIDER_SITE_OTHER): Payer: PPO | Admitting: Family Medicine

## 2016-12-28 VITALS — BP 150/70 | HR 76 | Temp 97.7°F | Wt 152.0 lb

## 2016-12-28 DIAGNOSIS — S81852A Open bite, left lower leg, initial encounter: Secondary | ICD-10-CM | POA: Diagnosis not present

## 2016-12-28 DIAGNOSIS — W540XXA Bitten by dog, initial encounter: Secondary | ICD-10-CM

## 2016-12-28 MED ORDER — AMOXICILLIN-POT CLAVULANATE 875-125 MG PO TABS: 1.0000 | ORAL_TABLET | Freq: Two times a day (BID) | ORAL | 0 refills | Status: AC

## 2016-12-28 NOTE — Progress Notes (Signed)
Subjective:    Patient ID: Molly Wilcox, female    DOB: [birth year], 76 y.o.   MRN: [id number]  No chief complaint on file.   HPI Patient was seen today for acute visit for dogbite on right lateral calf. Patient bitten yesterday by a Beryl Meager owned by her Emergency planning/management officer.  Patient states she opened the door to the shop and before she could get one leg fully in the door she was bitten. Patient cleaned the wound with hydrogen peroxide and antibacterial soap. Patient last tetanus shot was 2 months ago. Patient has been putting Neosporin on the wound.  Per patient dog has been vaccinated for rabies and is not to perform another vaccine until 2020 per owner.   Patient has a picture of initial bite wound on her phone. Past Medical History:  Diagnosis Date  . High cholesterol   . Hypertension   . Osteoarthritis   . Raynaud disease         Allergies  Allergen Reactions  . Prednisone Other (See Comments)    Dehydration, amnesia   . Ibuprofen Nausea Only and Other (See Comments)    Nausea & headaches    ROS  General: Denies fever, chills, night sweats, changes in weight, changes in appetite HEENT: Denies headaches, ear pain, changes in vision, rhinorrhea, sore throat CV: Denies CP, palpitations, SOB, orthopnea Pulm: Denies SOB, cough, wheezing GI: Denies abdominal pain, nausea, vomiting, diarrhea, constipation GU: Denies dysuria, hematuria, frequency, vaginal discharge Msk: Denies muscle cramps, joint pains Neuro: Denies weakness, numbness, tingling Skin: Denies rashes, bruising  + bruising, redness, bite on right calf Psych: Denies depression, anxiety, hallucinations      Objective:    Blood pressure (!) 150/70, pulse 76, temperature 97.7 F (36.5 C), temperature source Oral, weight 152 lb (68.9 kg).   Gen. Pleasant, well-nourished, in no distress, normal affect   HEENT: [place]/AT, face symmetric, no scleral icterus, PERRLA Lungs: no accessory muscle use, CTAB, no wheezes or  rales Cardiovascular: RRR, no m/r/g, no peripheral edema Neuro:  A&Ox3, CN II-XII intact, normal gait Skin:  Warm, right lateral calf with bite wound, surrounding erythema and ecchymosis mildly warm, no purulent drainage   Assessment/Plan:  Dog bite, initial  -Dogbite encounter form completed and to be faxed to health department. -Patient given handout on dogbite -Patient given RTC or ED precautions. -Advised to keep clean and dry. Will start Augmentin 5 days.  - Plan: amoxicillin-clavulanate (AUGMENTIN) 875-125 MG tablet  RTC when necessary if worsening.

## 2016-12-28 NOTE — Patient Instructions (Addendum)
Animal Bite Animal bite wounds can get infected. It is important to get proper medical treatment. Ask your doctor if you need rabies treatment. Follow these instructions at home: Wound care  Follow instructions from your doctor about how to take care of your wound. Make sure you: ? Wash your hands with soap and water before you change your bandage (dressing). If you cannot use soap and water, use hand sanitizer. ? Change your bandage as told by your doctor. ? Leave stitches (sutures), skin glue, or skin tape (adhesive) strips in place. They may need to stay in place for 2 weeks or longer. If tape strips get loose and curl up, you may trim the loose edges. Do not remove tape strips completely unless your doctor says it is okay.  Check your wound every day for signs of infection. Watch for: ? Redness, swelling, or pain that gets worse. ? Fluid, blood, or pus. General instructions  Take or apply over-the-counter and prescription medicines only as told by your doctor.  If you were prescribed an antibiotic, take or apply it as told by your doctor. Do not stop using the antibiotic even if your condition improves.  Keep the injured area raised (elevated) above the level of your heart while you are sitting or lying down.  If directed, apply ice to the injured area. ? Put ice in a plastic bag. ? Place a towel between your skin and the bag. ? Leave the ice on for 20 minutes, 2-3 times per day.  Keep all follow-up visits as told by your doctor. This is important. Contact a doctor if:  You have redness, swelling, or pain that gets worse.  You have a general feeling of sickness (malaise).  You feel sick to your stomach (nauseous).  You throw up (vomit).  You have pain that does not get better. Get help right away if:  You have a red streak going away from your wound.  You have fluid, blood, or pus coming from your wound.  You have a fever or chills.  You have trouble moving your  injured area.  You have numbness or tingling anywhere on your body. This information is not intended to replace advice given to you by your health care provider. Make sure you discuss any questions you have with your health care provider. Document Released: 03/20/2005 Document Revised: 08/26/2015 Document Reviewed: 08/05/2014 Elsevier Interactive Patient Education  2018 [employer]  

## 2017-02-05 ENCOUNTER — Encounter: Payer: Self-pay | Admitting: Family Medicine

## 2017-02-19 ENCOUNTER — Encounter: Payer: Self-pay | Admitting: Family Medicine

## 2017-02-27 DIAGNOSIS — M8589 Other specified disorders of bone density and structure, multiple sites: Secondary | ICD-10-CM | POA: Diagnosis not present

## 2017-02-27 DIAGNOSIS — Z1231 Encounter for screening mammogram for malignant neoplasm of breast: Secondary | ICD-10-CM | POA: Diagnosis not present

## 2017-02-27 DIAGNOSIS — M810 Age-related osteoporosis without current pathological fracture: Secondary | ICD-10-CM | POA: Diagnosis not present

## 2017-02-27 LAB — HM MAMMOGRAPHY

## 2017-02-27 LAB — HM DEXA SCAN

## 2017-03-02 ENCOUNTER — Encounter: Payer: Self-pay | Admitting: Family Medicine

## 2017-03-07 ENCOUNTER — Encounter: Payer: Self-pay | Admitting: Family Medicine

## 2017-03-09 ENCOUNTER — Encounter: Payer: Self-pay | Admitting: Family Medicine

## 2017-03-15 ENCOUNTER — Encounter: Payer: Self-pay | Admitting: Family Medicine

## 2017-03-16 ENCOUNTER — Encounter: Payer: Self-pay | Admitting: Family Medicine

## 2017-03-25 ENCOUNTER — Other Ambulatory Visit: Payer: Self-pay | Admitting: Family Medicine

## 2017-03-25 DIAGNOSIS — I10 Essential (primary) hypertension: Secondary | ICD-10-CM

## 2017-05-25 ENCOUNTER — Ambulatory Visit: Payer: PPO | Admitting: Family Medicine

## 2017-05-27 NOTE — Progress Notes (Signed)
[name] is a 77 y.o.female, who is here today to follow on HTN.  Currently she is on Maxzide 37.5-25 mg ,Atenolol 12.5 mg daily,and Amlodipine 2.5 mg daily. Atenolol was decreased due to bradycardia. BP readings at home: Not checking it. Last eye exam within a year.  She taking medications as instructed, no side effects reported.  She has not noted unusual headache, visual changes, exertional chest pain, dyspnea,  focal weakness, or edema.    Lab Results  Component Value Date   CREATININE 0.85 09/19/2016   BUN 27 (H) 09/19/2016   NA 140 09/19/2016   K 3.7 09/19/2016   CL 100 09/19/2016   CO2 30 09/19/2016    Hyperglycemia 09/2016 at 133.  Osteoporosis:  DEXA 12/2016. Fosamax 70 mg weekly,which she has taken for 3 years.  She is doing tai chi a few times per week.  She is the caregiver for her husband, who has Hx of MS.  Constipation: She has been taking Colace daily for a few weeks. She does not drink enough water. She has had problem for about a month, she attributes it to calcium supplementation.  Denies abdominal pain, nausea, vomiting, blood in stool or melena.    Lab Results  Component Value Date   TSH 1.48 09/19/2016    Review of Systems  Constitutional: Positive for fatigue. Negative for activity change, appetite change, fever and unexpected weight change.  HENT: Negative for mouth sores, nosebleeds and trouble swallowing.   Eyes: Negative for redness and visual disturbance.  Respiratory: Negative for cough, shortness of breath and wheezing.   Cardiovascular: Negative for chest pain, palpitations and leg swelling.  Gastrointestinal: Positive for constipation. Negative for abdominal pain, blood in stool, nausea and vomiting.  Endocrine: Negative for cold intolerance, heat intolerance, polydipsia, polyphagia and polyuria.  Genitourinary: Negative for decreased urine volume, dysuria and hematuria.  Musculoskeletal: Negative for gait  problem and myalgias.  Neurological: Negative for syncope, weakness and headaches.  Psychiatric/Behavioral: Positive for sleep disturbance. Negative for confusion.     Current Outpatient Medications on File Prior to Visit  Medication Sig Dispense Refill  . alendronate (FOSAMAX) 70 MG tablet TAKE 1 TABLET(S) EVERY WEEK BY ORAL ROUTE 12 tablet 3  . amLODipine (NORVASC) 2.5 MG tablet Take 1 tablet (2.5 mg total) by mouth daily. 90 tablet 2  . aspirin EC 81 MG tablet Take 81 mg by mouth daily.    [name] Kitchen atenolol (TENORMIN) 25 MG tablet Take 0.5 tablets (12.5 mg total) by mouth daily. 45 tablet 1  . Ca Phosphate-Cholecalciferol (CALCIUM/VITAMIN D3 GUMMIES PO) Take 2 gummies by mouth daily.    [name] Kitchen docusate sodium (COLACE) 100 MG capsule Take 100 mg by mouth as needed for mild constipation.    . ranitidine (ZANTAC) 150 MG tablet Take 150 mg by mouth as needed for heartburn.    . simvastatin (ZOCOR) 10 MG tablet TAKE 1 TABLET BY MOUTH EVERYDAY AT BEDTIME 90 tablet 2  . triamterene-hydrochlorothiazide (MAXZIDE-25) 37.5-25 MG tablet TAKE 1 TABLET BY MOUTH EVERY DAY 90 tablet 1   No current facility-administered medications on file prior to visit.      Past Medical History:  Diagnosis Date  . High cholesterol   . Hypertension   . Osteoarthritis   . Raynaud disease     Allergies  Allergen Reactions  . Prednisone Other (See Comments)    Dehydration, amnesia   . Ibuprofen Nausea Only and Other (See Comments)    Nausea & headaches  Social History   Socioeconomic History  . Marital status: Married    Spouse name: None  . Number of children: None  . Years of education: None  . Highest education level: None  Social Needs  . Financial resource strain: None  . Food insecurity - worry: None  . Food insecurity - inability: None  . Transportation needs - medical: None  . Transportation needs - non-medical: None  Occupational History  . None  Tobacco Use  . Smoking status: Never Smoker  .  Smokeless tobacco: Never Used  Substance and Sexual Activity  . Alcohol use: Yes  . Drug use: No  . Sexual activity: Not Currently  Other Topics Concern  . None  Social History Narrative  . None    Vitals:   05/28/17 0940  BP: 132/80  Pulse: (!) 58  Resp: 12  Temp: 97.7 F (36.5 C)  SpO2: 99%   Body mass index is 25.86 kg/m.    Physical Exam  Nursing note and vitals reviewed. Constitutional: She is oriented to person, place, and time. She appears well-developed and well-nourished. No distress.  HENT:  Head: Normocephalic and atraumatic.  Mouth/Throat: Oropharynx is clear and moist and mucous membranes are normal.  Eyes: Conjunctivae are normal. Pupils are equal, round, and reactive to light.  Cardiovascular: Regular rhythm. Bradycardia present.  No murmur heard. Pulses:      Dorsalis pedis pulses are 2+ on the right side, and 2+ on the left side.  Respiratory: Effort normal and breath sounds normal. No respiratory distress.  GI: Soft. She exhibits no mass. There is no hepatomegaly. There is no tenderness.  Musculoskeletal: She exhibits no edema.  Lymphadenopathy:    She has no cervical adenopathy.  Neurological: She is alert and oriented to person, place, and time. She has normal strength. Gait normal.  Skin: Skin is warm. No rash noted. No erythema.  Psychiatric: She has a normal mood and affect.  Well groomed, good eye contact.    ASSESSMENT AND PLAN:   Molly Wilcox was seen today for follow-up.  Orders Placed This Encounter  Procedures  . Basic metabolic panel  . TSH  . Hemoglobin A1c     Bradycardia Mild and asymptomatic. She agrees with weaning off Atenolol. She was instructed to check HR. Instructed about warning signs.    Essential hypertension Adequately controlled. Because bradycardia, Atenolol will be weaned off. Amlodipine will be increased from 2.5 mg to 5 mg. Monitor BP at home. BP readings in 4 weeks. DASH and low salt diet  recommended. Eye exam recommended annually. F/U in 6 months, before if needed.   Constipation Benafiber may help. Stop Ca++ supplementation for 2-3 weeks and monitor for changes. Adequate fluid intake. Instructed about warning signs.   Localized osteoporosis without current pathological fracture  Fall prevention. Ca++ 1200 mg though diet, vit D [phone] U daily. Continue regular exercise. DEXA in 2-3 years.   Hyperglycemia  Further recommendations will be given according to HgA1C results.     -Molly Wilcox advised to return sooner than planned today if new concerns arise.     Betty G. Martinique, MD  [facility]. [place] office.

## 2017-05-28 ENCOUNTER — Encounter: Payer: Self-pay | Admitting: Family Medicine

## 2017-05-28 ENCOUNTER — Ambulatory Visit (INDEPENDENT_AMBULATORY_CARE_PROVIDER_SITE_OTHER): Payer: PPO | Admitting: Family Medicine

## 2017-05-28 VITALS — BP 132/80 | HR 58 | Temp 97.7°F | Resp 12 | Ht 63.0 in | Wt 146.0 lb

## 2017-05-28 DIAGNOSIS — K5900 Constipation, unspecified: Secondary | ICD-10-CM | POA: Diagnosis not present

## 2017-05-28 DIAGNOSIS — I10 Essential (primary) hypertension: Secondary | ICD-10-CM | POA: Diagnosis not present

## 2017-05-28 DIAGNOSIS — R739 Hyperglycemia, unspecified: Secondary | ICD-10-CM | POA: Diagnosis not present

## 2017-05-28 DIAGNOSIS — R001 Bradycardia, unspecified: Secondary | ICD-10-CM

## 2017-05-28 DIAGNOSIS — M816 Localized osteoporosis [Lequesne]: Secondary | ICD-10-CM

## 2017-05-28 NOTE — Assessment & Plan Note (Signed)
Adequately controlled. Because bradycardia, Atenolol will be weaned off. Amlodipine will be increased from 2.5 mg to 5 mg. Monitor BP at home. BP readings in 4 weeks. DASH and low salt diet recommended. Eye exam recommended annually. F/U in 6 months, before if needed.

## 2017-05-28 NOTE — Assessment & Plan Note (Signed)
Mild and asymptomatic. She agrees with weaning off Atenolol. She was instructed to check HR. Instructed about warning signs.

## 2017-05-28 NOTE — Patient Instructions (Addendum)
A few things to remember from today's visit:   Essential hypertension  Bradycardia  Constipation, unspecified constipation type   Benafiber may help with constipation. Stop Calcium for a few weeks and montor for changes. Calcium 1200 mg through diet.     DASH Eating Plan DASH stands for "Dietary Approaches to Stop Hypertension." The DASH eating plan is a healthy eating plan that has been shown to reduce high blood pressure (hypertension). It may also reduce your risk for type 2 diabetes, heart disease, and stroke. The DASH eating plan may also help with weight loss. What are tips for following this plan? General guidelines  Avoid eating more than 2,300 mg (milligrams) of salt (sodium) a day. If you have hypertension, you may need to reduce your sodium intake to 1,500 mg a day.  Limit alcohol intake to no more than 1 drink a day for nonpregnant women and 2 drinks a day for men. One drink equals 12 oz of beer, 5 oz of wine, or 1 oz of hard liquor.  Work with your health care provider to maintain a healthy body weight or to lose weight. Ask what an ideal weight is for you.  Get at least 30 minutes of exercise that causes your heart to beat faster (aerobic exercise) most days of the week. Activities may include walking, swimming, or biking.  Work with your health care provider or diet and nutrition specialist (dietitian) to adjust your eating plan to your individual calorie needs. Reading food labels  Check food labels for the amount of sodium per serving. Choose foods with less than 5 percent of the Daily Value of sodium. Generally, foods with less than 300 mg of sodium per serving fit into this eating plan.  To find whole grains, look for the word "whole" as the first word in the ingredient list. Shopping  Buy products labeled as "low-sodium" or "no salt added."  Buy fresh foods. Avoid canned foods and premade or frozen meals. Cooking  Avoid adding salt when cooking. Use  salt-free seasonings or herbs instead of table salt or sea salt. Check with your health care provider or pharmacist before using salt substitutes.  Do not fry foods. Cook foods using healthy methods such as baking, boiling, grilling, and broiling instead.  Cook with heart-healthy oils, such as olive, canola, soybean, or sunflower oil. Meal planning   Eat a balanced diet that includes: ? 5 or more servings of fruits and vegetables each day. At each meal, try to fill half of your plate with fruits and vegetables. ? Up to 6-8 servings of whole grains each day. ? Less than 6 oz of lean meat, poultry, or fish each day. A 3-oz serving of meat is about the same size as a deck of cards. One egg equals 1 oz. ? 2 servings of low-fat dairy each day. ? A serving of nuts, seeds, or beans 5 times each week. ? Heart-healthy fats. Healthy fats called Omega-3 fatty acids are found in foods such as flaxseeds and coldwater fish, like sardines, salmon, and mackerel.  Limit how much you eat of the following: ? Canned or prepackaged foods. ? Food that is high in trans fat, such as fried foods. ? Food that is high in saturated fat, such as fatty meat. ? Sweets, desserts, sugary drinks, and other foods with added sugar. ? Full-fat dairy products.  Do not salt foods before eating.  Try to eat at least 2 vegetarian meals each week.  Eat more home-cooked food and  less restaurant, buffet, and fast food.  When eating at a restaurant, ask that your food be prepared with less salt or no salt, if possible. What foods are recommended? The items listed may not be a complete list. Talk with your dietitian about what dietary choices are best for you. Grains Whole-grain or whole-wheat bread. Whole-grain or whole-wheat pasta. Brown rice. Modena [place]row. Bulgur. Whole-grain and low-sodium cereals. Pita bread. Low-fat, low-sodium crackers. Whole-wheat flour tortillas. Vegetables Fresh or frozen vegetables (raw, steamed,  roasted, or grilled). Low-sodium or reduced-sodium tomato and vegetable juice. Low-sodium or reduced-sodium tomato sauce and tomato paste. Low-sodium or reduced-sodium canned vegetables. Fruits All fresh, dried, or frozen fruit. Canned fruit in natural juice (without added sugar). Meat and other protein foods Skinless chicken or [place]. Ground chicken or [place]. Pork with fat trimmed off. Fish and seafood. Egg whites. Dried beans, peas, or lentils. Unsalted nuts, nut butters, and seeds. Unsalted canned beans. Lean cuts of beef with fat trimmed off. Low-sodium, lean deli meat. Dairy Low-fat (1%) or fat-free (skim) milk. Fat-free, low-fat, or reduced-fat cheeses. Nonfat, low-sodium ricotta or cottage cheese. Low-fat or nonfat yogurt. Low-fat, low-sodium cheese. Fats and oils Soft margarine without trans fats. Vegetable oil. Low-fat, reduced-fat, or light mayonnaise and salad dressings (reduced-sodium). Canola, safflower, olive, soybean, and sunflower oils. Avocado. Seasoning and other foods Herbs. Spices. Seasoning mixes without salt. Unsalted popcorn and pretzels. Fat-free sweets. What foods are not recommended? The items listed may not be a complete list. Talk with your dietitian about what dietary choices are best for you. Grains Baked goods made with fat, such as croissants, muffins, or some breads. Dry pasta or rice meal packs. Vegetables Creamed or fried vegetables. Vegetables in a cheese sauce. Regular canned vegetables (not low-sodium or reduced-sodium). Regular canned tomato sauce and paste (not low-sodium or reduced-sodium). Regular tomato and vegetable juice (not low-sodium or reduced-sodium). Angie Fava. Olives. Fruits Canned fruit in a light or heavy syrup. Fried fruit. Fruit in cream or butter sauce. Meat and other protein foods Fatty cuts of meat. Ribs. Fried meat. Berniece Salines. Sausage. Bologna and other processed lunch meats. Salami. Fatback. Hotdogs. Bratwurst. Salted nuts and seeds. Canned  beans with added salt. Canned or smoked fish. Whole eggs or egg yolks. Chicken or [place] with skin. Dairy Whole or 2% milk, cream, and half-and-half. Whole or full-fat cream cheese. Whole-fat or sweetened yogurt. Full-fat cheese. Nondairy creamers. Whipped toppings. Processed cheese and cheese spreads. Fats and oils Butter. Stick margarine. Lard. Shortening. Ghee. Bacon fat. Tropical oils, such as coconut, palm kernel, or palm oil. Seasoning and other foods Salted popcorn and pretzels. Onion salt, garlic salt, seasoned salt, table salt, and sea salt. Worcestershire sauce. Tartar sauce. Barbecue sauce. Teriyaki sauce. Soy sauce, including reduced-sodium. Steak sauce. Canned and packaged gravies. Fish sauce. Oyster sauce. Cocktail sauce. Horseradish that you find on the shelf. Ketchup. Mustard. Meat flavorings and tenderizers. Bouillon cubes. Hot sauce and Tabasco sauce. Premade or packaged marinades. Premade or packaged taco seasonings. Relishes. Regular salad dressings. Where to find more information:  National Heart, Lung, and [place]: https://[place]-eaton.com/  American Heart Association: www.heart.org Summary  The DASH eating plan is a healthy eating plan that has been shown to reduce high blood pressure (hypertension). It may also reduce your risk for type 2 diabetes, heart disease, and stroke.  With the DASH eating plan, you should limit salt (sodium) intake to 2,300 mg a day. If you have hypertension, you may need to reduce your sodium intake to 1,500 mg a day.  When on the DASH eating plan, aim to eat more fresh fruits and vegetables, whole grains, lean proteins, low-fat dairy, and heart-healthy fats.  Work with your health care provider or diet and nutrition specialist (dietitian) to adjust your eating plan to your individual calorie needs. This information is not intended to replace advice given to you by your health care provider. Make sure you discuss any questions you have with your  health care provider. Document Released: 03/09/2011 Document Revised: 03/13/2016 Document Reviewed: 03/13/2016 Elsevier Interactive Patient Education  [employer].  Please be sure medication list is accurate. If a new problem present, please set up appointment sooner than planned today.

## 2017-05-28 NOTE — Assessment & Plan Note (Signed)
Benafiber may help. Stop Ca++ supplementation for 2-3 weeks and monitor for changes. Adequate fluid intake. Instructed about warning signs.

## 2017-05-31 ENCOUNTER — Encounter: Payer: Self-pay | Admitting: Family Medicine

## 2017-06-11 ENCOUNTER — Encounter: Payer: Self-pay | Admitting: Family Medicine

## 2017-06-11 ENCOUNTER — Other Ambulatory Visit: Payer: Self-pay | Admitting: *Deleted

## 2017-06-11 DIAGNOSIS — I10 Essential (primary) hypertension: Secondary | ICD-10-CM

## 2017-06-11 MED ORDER — AMLODIPINE BESYLATE 2.5 MG PO TABS: 2.5000 mg | ORAL_TABLET | Freq: Every day | ORAL | 2 refills | Status: DC

## 2017-06-12 ENCOUNTER — Encounter: Payer: Self-pay | Admitting: Family Medicine

## 2017-06-12 ENCOUNTER — Telehealth: Payer: Self-pay | Admitting: Family Medicine

## 2017-06-12 NOTE — Telephone Encounter (Signed)
Copied from [place] [id number] [id number]. Topic: Quick Communication - See Telephone Encounter >> Jun 12, 2017  4:52 PM Vernona Rieger wrote: CRM for notification. See Telephone encounter for:   06/12/17.  Patient said that Dr Martinique told her to up the amLODipine (NORVASC) 2.5 MG tablet to 5mg . Patient is requesting a new script.  [id number] - [place], [place] [address]

## 2017-06-13 ENCOUNTER — Other Ambulatory Visit: Payer: Self-pay | Admitting: *Deleted

## 2017-06-13 DIAGNOSIS — I10 Essential (primary) hypertension: Secondary | ICD-10-CM

## 2017-06-13 MED ORDER — AMLODIPINE BESYLATE 5 MG PO TABS: 5.0000 mg | ORAL_TABLET | Freq: Every day | ORAL | 1 refills | Status: DC

## 2017-06-13 NOTE — Telephone Encounter (Signed)
Pt requesting new prescription for amlodipine, because of dosage change from 2.5 mg to 5 mg.  Provider: Betty Martinique, MD  Cassell Clement  05/28/17  Ph[facility]: [facility] on [address]

## 2017-06-13 NOTE — Telephone Encounter (Signed)
Rx has already been sent to pharmacy this morning.

## 2017-06-15 ENCOUNTER — Encounter: Payer: Self-pay | Admitting: Family Medicine

## 2017-06-18 ENCOUNTER — Other Ambulatory Visit (INDEPENDENT_AMBULATORY_CARE_PROVIDER_SITE_OTHER): Payer: PPO

## 2017-06-18 DIAGNOSIS — E042 Nontoxic multinodular goiter: Secondary | ICD-10-CM

## 2017-06-18 DIAGNOSIS — I10 Essential (primary) hypertension: Secondary | ICD-10-CM

## 2017-06-18 LAB — BASIC METABOLIC PANEL
BUN: 20 mg/dL (ref 6–23)
CHLORIDE: 99 meq/L (ref 96–112)
CO2: 30 mEq/L (ref 19–32)
CREATININE: 0.69 mg/dL (ref 0.40–1.20)
Calcium: 9.6 mg/dL (ref 8.4–10.5)
GFR: 87.58 mL/min (ref >60.00)
Glucose, Bld: 109 mg/dL — ABNORMAL HIGH (ref 70–99)
POTASSIUM: 3.7 meq/L (ref 3.5–5.1)
Sodium: 138 mEq/L (ref 135–145)

## 2017-06-18 LAB — TSH: TSH: 2.26 u[IU]/mL (ref 0.35–4.50)

## 2017-06-18 LAB — HEMOGLOBIN A1C: Hgb A1c MFr Bld: 6.2 % (ref 4.6–6.5)

## 2017-06-20 ENCOUNTER — Encounter: Payer: Self-pay | Admitting: Family Medicine

## 2017-06-21 ENCOUNTER — Encounter: Payer: Self-pay | Admitting: Family Medicine

## 2017-06-22 ENCOUNTER — Encounter: Payer: Self-pay | Admitting: Family Medicine

## 2017-07-19 ENCOUNTER — Encounter: Payer: Self-pay | Admitting: Family Medicine

## 2017-07-19 ENCOUNTER — Ambulatory Visit (INDEPENDENT_AMBULATORY_CARE_PROVIDER_SITE_OTHER): Payer: PPO | Admitting: Family Medicine

## 2017-07-19 VITALS — BP 120/78 | HR 94 | Temp 98.2°F | Ht 63.0 in | Wt 146.5 lb

## 2017-07-19 DIAGNOSIS — W57XXXA Bitten or stung by nonvenomous insect and other nonvenomous arthropods, initial encounter: Secondary | ICD-10-CM

## 2017-07-19 DIAGNOSIS — N8110 Cystocele, unspecified: Secondary | ICD-10-CM | POA: Diagnosis not present

## 2017-07-19 DIAGNOSIS — L03115 Cellulitis of right lower limb: Secondary | ICD-10-CM | POA: Diagnosis not present

## 2017-07-19 MED ORDER — DOXYCYCLINE HYCLATE 100 MG PO TABS: 100.0000 mg | ORAL_TABLET | Freq: Two times a day (BID) | ORAL | 0 refills | Status: DC

## 2017-07-19 NOTE — Addendum Note (Signed)
Addended by: Agnes Lawrence on: 07/19/2017 02:07 PM   Modules accepted: Orders

## 2017-07-19 NOTE — Patient Instructions (Signed)
Keep area clean and dry  Take the doxycycline. Stay out of the sun and discuss risks with pharmacist.  Seek prompt care if any concerns or illness.  See gynecologist about the prolapse.

## 2017-07-19 NOTE — Progress Notes (Signed)
  HPI:  Using dictation device. Unfortunately this device frequently misinterprets words/phrases.  Acute visit for ? Tick bite:  -noticed yesterday -L groin, she could not remove - she manipulated and tried a lot -redness, edema site -no fevers, malaise, chills, illness  ? Prolapse uterus. Wonders whom she can see. Bulge with valsalva.  ROS: See pertinent positives and negatives per HPI.  Past Medical History:  Diagnosis Date  . High cholesterol   . Hypertension   . Osteoarthritis   . Raynaud disease     Past Surgical History:  Procedure Laterality Date  . TUBAL LIGATION      Family History  Problem Relation Age of Onset  . Heart disease Father   . Thyroid disease Neg Hx     SOCIAL HX:    Current Outpatient Medications:  .  alendronate (FOSAMAX) 70 MG tablet, TAKE 1 TABLET(S) EVERY WEEK BY ORAL ROUTE, Disp: 12 tablet, Rfl: 3 .  amLODipine (NORVASC) 5 MG tablet, Take 1 tablet (5 mg total) by mouth daily., Disp: 90 tablet, Rfl: 1 .  aspirin EC 81 MG tablet, Take 81 mg by mouth daily., Disp: , Rfl:  .  atenolol (TENORMIN) 25 MG tablet, Take 0.5 tablets (12.5 mg total) by mouth daily., Disp: 45 tablet, Rfl: 1 .  Ca Phosphate-Cholecalciferol (CALCIUM/VITAMIN D3 GUMMIES PO), Take 2 gummies by mouth daily., Disp: , Rfl:  .  docusate sodium (COLACE) 100 MG capsule, Take 100 mg by mouth as needed for mild constipation., Disp: , Rfl:  .  ranitidine (ZANTAC) 150 MG tablet, Take 150 mg by mouth as needed for heartburn., Disp: , Rfl:  .  simvastatin (ZOCOR) 10 MG tablet, TAKE 1 TABLET BY MOUTH EVERYDAY AT BEDTIME, Disp: 90 tablet, Rfl: 2 .  triamterene-hydrochlorothiazide (MAXZIDE-25) 37.5-25 MG tablet, TAKE 1 TABLET BY MOUTH EVERY DAY, Disp: 90 tablet, Rfl: 1 .  doxycycline (VIBRA-TABS) 100 MG tablet, Take 1 tablet (100 mg total) by mouth 2 (two) times daily., Disp: 14 tablet, Rfl: 0  EXAM:  Vitals:   07/19/17 1333  BP: 120/78  Pulse: 94  Temp: 98.2 F (36.8 C)    Body  mass index is 25.95 kg/m.  GENERAL: vitals reviewed and listed above, alert, oriented, appears well hydrated and in no acute distress  HEENT: atraumatic, conjunttiva clear, no obvious abnormalities on inspection of external nose and ears  NECK: no obvious masses on inspection  SKIN: small tick R groin, removed in entirety, tramatized and drid blood from her attempts with small area erythema/edema  GU: bladder prolapse  MS: moves all extremities without noticeable abnormality  PSYCH: pleasant and cooperative, no obvious depression or anxiety  ASSESSMENT AND PLAN:  Discussed the following assessment and plan:  Tick bite, initial encounter -tick removed, discussed tick borne illness, doxy  Cellulitis of right lower extremity vs reaction -doxy  Female bladder prolapse -gyn brochure provided for her to schedule eval  -Patient advised to return or notify a doctor immediately if symptoms worsen or persist or new concerns arise.  Patient Instructions  Keep area clean and dry  Take the doxycycline. Stay out of the sun and discuss risks with pharmacist.  Seek prompt care if any concerns or illness.  See gynecologist about the prolapse.    Lucretia Kern, DO \

## 2017-07-23 ENCOUNTER — Ambulatory Visit (INDEPENDENT_AMBULATORY_CARE_PROVIDER_SITE_OTHER): Payer: PPO | Admitting: Family Medicine

## 2017-07-23 ENCOUNTER — Ambulatory Visit: Payer: Self-pay | Admitting: *Deleted

## 2017-07-23 ENCOUNTER — Encounter: Payer: Self-pay | Admitting: Family Medicine

## 2017-07-23 VITALS — BP 130/80 | HR 88 | Temp 98.3°F | Resp 12 | Ht 63.0 in | Wt 145.2 lb

## 2017-07-23 DIAGNOSIS — S40262A Insect bite (nonvenomous) of left shoulder, initial encounter: Secondary | ICD-10-CM | POA: Diagnosis not present

## 2017-07-23 DIAGNOSIS — T63481A Toxic effect of venom of other arthropod, accidental (unintentional), initial encounter: Secondary | ICD-10-CM

## 2017-07-23 DIAGNOSIS — W57XXXA Bitten or stung by nonvenomous insect and other nonvenomous arthropods, initial encounter: Secondary | ICD-10-CM | POA: Diagnosis not present

## 2017-07-23 MED ORDER — TRIAMCINOLONE ACETONIDE 0.1 % EX CREA: 1.0000 "application " | TOPICAL_CREAM | Freq: Two times a day (BID) | CUTANEOUS | 0 refills | Status: AC

## 2017-07-23 NOTE — Telephone Encounter (Signed)
Noted.  Appt made on 07/23/17

## 2017-07-23 NOTE — Telephone Encounter (Signed)
Pt reports tick bite, left clavicle. Noted Friday, removed Saturday. Pt seen 07/19/17 by Dr. Maudie Mercury for tick bite at groin area, placed on Doxycycline which pt has been taking.  Reports this new bite has increased in warmth and redness.  Area is "at least 3 ins. in diameter."  States she is not sure if friend was able to remove entire tick. Denies fever,chills,SOB. Same day appt made with Dr. Martinique; 30 minute split to 15 with approval of Sheena.  Reason for Disposition . [1] Red or very tender (to touch) area AND [2] started over 24 hours after the bite  Answer Assessment - Initial Assessment Questions 1. TYPE of TICK: "Is it a wood tick or a deer tick?" If unsure, ask: "What size was the tick?" "Did it look more like a watermelon seed or a poppy seed?"      Not sure 2. LOCATION: "Where is the tick bite located?"      Left clavicle area 3. ONSET: "How long do you think the tick was attached before you removed it?" (Hours or days)      Unsure, noted Friday night, removed Saturday 4. TETANUS: "When was the last tetanus booster Unsure  Protocols used: TICK BITE-A-AH

## 2017-07-23 NOTE — Progress Notes (Signed)
ACUTE VISIT   HPI:  Chief Complaint  Patient presents with  . Tick Removal    left clavicle/shoulder area, redness and ittching, area of redness has increased since Friday    [name] is a 77 y.o. female, who is here today complaining of pruritic and mildly tender rash on left shoulder, which is getting worse. She was seen recently for tick bite in left groin area. A friend found and removed another tick from left shoulder 1-2 days after her recent visit.  Currently she is on doxycycline 100 mg twice daily, which was started on 07/19/2017.  She has no noted chills, fever, unusual arthralgias, or worsening fatigue. She has been applying OTC hydrocortisone, which she started yesterday and has helped some. She has also been using Neosporin.   Review of Systems  Constitutional: Negative for chills, fatigue and fever.  HENT: Negative for mouth sores and sore throat.   Respiratory: Negative for cough, shortness of breath and wheezing.   Gastrointestinal: Negative for abdominal pain, diarrhea, nausea and vomiting.  Musculoskeletal: Negative for gait problem, joint swelling, myalgias and neck pain.  Skin: Positive for rash.  Neurological: Negative for weakness, numbness and headaches.  Hematological: Negative for adenopathy. Does not bruise/bleed easily.      Current Outpatient Medications on File Prior to Visit  Medication Sig Dispense Refill  . alendronate (FOSAMAX) 70 MG tablet TAKE 1 TABLET(S) EVERY WEEK BY ORAL ROUTE 12 tablet 3  . amLODipine (NORVASC) 10 MG tablet Take 10 mg by mouth daily.    [name] Kitchen aspirin EC 81 MG tablet Take 81 mg by mouth daily.    . Ca Phosphate-Cholecalciferol (CALCIUM/VITAMIN D3 GUMMIES PO) Take 2 gummies by mouth daily.    [name] Kitchen docusate sodium (COLACE) 100 MG capsule Take 100 mg by mouth as needed for mild constipation.    [name] Kitchen doxycycline (VIBRA-TABS) 100 MG tablet Take 1 tablet (100 mg total) by mouth 2 (two) times daily. 14 tablet 0  .  MAGNESIUM OXIDE PO Take 250 mg by mouth daily.     . ranitidine (ZANTAC) 150 MG tablet Take 150 mg by mouth as needed for heartburn.    . simvastatin (ZOCOR) 10 MG tablet TAKE 1 TABLET BY MOUTH EVERYDAY AT BEDTIME 90 tablet 2  . triamterene-hydrochlorothiazide (MAXZIDE-25) 37.5-25 MG tablet TAKE 1 TABLET BY MOUTH EVERY DAY 90 tablet 1   No current facility-administered medications on file prior to visit.      Past Medical History:  Diagnosis Date  . High cholesterol   . Hypertension   . Osteoarthritis   . Raynaud disease    Allergies  Allergen Reactions  . Prednisone Other (See Comments)    Dehydration, amnesia   . Ibuprofen Nausea Only and Other (See Comments)    Nausea & headaches    Social History   Socioeconomic History  . Marital status: Married    Spouse name: Not on file  . Number of children: Not on file  . Years of education: Not on file  . Highest education level: Not on file  Occupational History  . Not on file  Social Needs  . Financial resource strain: Not on file  . Food insecurity:    Worry: Not on file    Inability: Not on file  . Transportation needs:    Medical: Not on file    Non-medical: Not on file  Tobacco Use  . Smoking status: Never Smoker  . Smokeless tobacco: Never Used  Substance  and Sexual Activity  . Alcohol use: Yes  . Drug use: No  . Sexual activity: Not Currently  Lifestyle  . Physical activity:    Days per week: Not on file    Minutes per session: Not on file  . Stress: Not on file  Relationships  . Social connections:    Talks on phone: Not on file    Gets together: Not on file    Attends religious service: Not on file    Active member of club or organization: Not on file    Attends meetings of clubs or organizations: Not on file    Relationship status: Not on file  Other Topics Concern  . Not on file  Social History Narrative  . Not on file    Vitals:   07/23/17 1122  BP: 130/80  Pulse: 88  Resp: 12  Temp:  98.3 F (36.8 C)  SpO2: 99%   Body mass index is 25.73 kg/m.   Physical Exam  Nursing note and vitals reviewed. Constitutional: She is oriented to person, place, and time. She appears well-developed and well-nourished. No distress.  HENT:  Head: Normocephalic and atraumatic.  Mouth/Throat: Oropharynx is clear and moist and mucous membranes are normal.  Eyes: Conjunctivae are normal.  Cardiovascular: Normal rate and regular rhythm.  No murmur heard. Respiratory: Effort normal and breath sounds normal. No respiratory distress.  Musculoskeletal: She exhibits no edema.  Lymphadenopathy:    She has no cervical adenopathy.  Neurological: She is alert and oriented to person, place, and time. She has normal strength. Gait normal.  Skin: Skin is warm. Rash noted. No ecchymosis noted. Rash is not vesicular.     Central papular lesion with surrounding erythema and local heat, about 10-11 cm,some tenderness with palpation.  There is no induration or fluctuant area.   Psychiatric: She has a normal mood and affect.  Well groomed, good eye contact.      ASSESSMENT AND PLAN:   [name] was seen today for tick removal.  Diagnoses and all orders for this visit:  Tick bite of left shoulder, initial encounter -     B. burgdorfi Antibody  Local reaction to insect sting, accidental or unintentional, initial encounter -     triamcinolone cream (KENALOG) 0.1 %; Apply 1 application topically 2 (two) times daily for 14 days.   She was instructed to complete Doxycycline treatment. Triamcinolone cream 0.1% to use twice daily for up to 14 days was recommended. Monitor for new associated symptoms. Further recommendation will be given according to lab results.      Molly Beever G. Martinique, MD  [facility]. [place] office.

## 2017-07-23 NOTE — Patient Instructions (Signed)
A few things to remember from today's visit:   Tick bite of left shoulder, initial encounter - Plan: B. burgdorfi Antibody  Local reaction to insect sting, accidental or unintentional, initial encounter - Plan: triamcinolone cream (KENALOG) 0.1 %  Continue doxycycline. Stop hydrocortisone and try triamcinolone twice daily for 14 days. Today we will check for Lyme and further recommendations will be given depending on results.    Please be sure medication list is accurate. If a new problem present, please set up appointment sooner than planned today.

## 2017-07-24 ENCOUNTER — Encounter: Payer: Self-pay | Admitting: Family Medicine

## 2017-07-24 LAB — B. BURGDORFI ANTIBODIES

## 2017-07-25 ENCOUNTER — Encounter: Payer: Self-pay | Admitting: Family Medicine

## 2017-08-02 DIAGNOSIS — N8110 Cystocele, unspecified: Secondary | ICD-10-CM | POA: Diagnosis not present

## 2017-08-28 ENCOUNTER — Other Ambulatory Visit: Payer: Self-pay | Admitting: Family Medicine

## 2017-08-28 DIAGNOSIS — E7800 Pure hypercholesterolemia, unspecified: Secondary | ICD-10-CM

## 2017-09-05 ENCOUNTER — Ambulatory Visit (INDEPENDENT_AMBULATORY_CARE_PROVIDER_SITE_OTHER): Payer: PPO | Admitting: Endocrinology

## 2017-09-05 ENCOUNTER — Encounter: Payer: Self-pay | Admitting: Endocrinology

## 2017-09-05 VITALS — BP 122/86 | HR 88 | Wt 146.4 lb

## 2017-09-05 DIAGNOSIS — E042 Nontoxic multinodular goiter: Secondary | ICD-10-CM

## 2017-09-05 NOTE — Patient Instructions (Addendum)
Let's recheck the ultrasound.  you will receive a phone call, about a day and time for an appointment.   Please come back for a follow-up appointment in 1 year.   

## 2017-09-05 NOTE — Progress Notes (Signed)
Subjective:    Patient ID: Molly Wilcox, female    DOB: 1940-12-12, 77 y.o.   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  pt states she feels well in general.  Denies neck swelling or neck pain Past Medical History:  Diagnosis Date  . High cholesterol   . Hypertension   . Osteoarthritis   . Raynaud disease     Past Surgical History:  Procedure Laterality Date  . TUBAL LIGATION      Social History   Socioeconomic History  . Marital status: Married    Spouse name: Not on file  . Number of children: Not on file  . Years of education: Not on file  . Highest education level: Not on file  Occupational History  . Not on file  Social Needs  . Financial resource strain: Not on file  . Food insecurity:    Worry: Not on file    Inability: Not on file  . Transportation needs:    Medical: Not on file    Non-medical: Not on file  Tobacco Use  . Smoking status: Never Smoker  . Smokeless tobacco: Never Used  Substance and Sexual Activity  . Alcohol use: Yes  . Drug use: No  . Sexual activity: Not Currently  Lifestyle  . Physical activity:    Days per week: Not on file    Minutes per session: Not on file  . Stress: Not on file  Relationships  . Social connections:    Talks on phone: Not on file    Gets together: Not on file    Attends religious service: Not on file    Active member of club or organization: Not on file    Attends meetings of clubs or organizations: Not on file    Relationship status: Not on file  . Intimate partner violence:    Fear of current or ex partner: Not on file    Emotionally abused: Not on file    Physically abused: Not on file    Forced sexual activity: Not on file  Other Topics Concern  . Not on file  Social History Narrative  . Not on file    Current Outpatient Medications on File Prior to Visit    Medication Sig Dispense Refill  . alendronate (FOSAMAX) 70 MG tablet TAKE 1 TABLET(S) EVERY WEEK BY ORAL ROUTE 12 tablet 3  . amLODipine (NORVASC) 10 MG tablet Take 10 mg by mouth daily.    [name] Kitchen aspirin EC 81 MG tablet Take 81 mg by mouth daily.    . Ca Phosphate-Cholecalciferol (CALCIUM/VITAMIN D3 GUMMIES PO) Take 2 gummies by mouth daily.    [name] Kitchen docusate sodium (COLACE) 100 MG capsule Take 100 mg by mouth as needed for mild constipation.    [name] Kitchen doxycycline (VIBRA-TABS) 100 MG tablet Take 1 tablet (100 mg total) by mouth 2 (two) times daily. 14 tablet 0  . MAGNESIUM OXIDE PO Take 250 mg by mouth daily.     . ranitidine (ZANTAC) 150 MG tablet Take 150 mg by mouth as needed for heartburn.    . simvastatin (ZOCOR) 10 MG tablet TAKE 1 TABLET BY MOUTH EVERYDAY AT BEDTIME 90 tablet 2  . triamterene-hydrochlorothiazide (MAXZIDE-25) 37.5-25 MG tablet TAKE 1 TABLET BY MOUTH EVERY DAY 90 tablet 1   No current facility-administered medications on file prior to visit.  Allergies  Allergen Reactions  . Prednisone Other (See Comments)    Dehydration, amnesia   . Ibuprofen Nausea Only and Other (See Comments)    Nausea & headaches    Family History  Problem Relation Age of Onset  . Heart disease Father   . Thyroid disease Neg Hx     BP 122/86   Pulse 88   Wt 146 lb 6.4 oz (66.4 kg)   SpO2 97%   BMI 25.93 kg/m   Review of Systems Denies sob    Objective:   Physical Exam VS: see vs page GEN: no distress NECK: left thyroid nodule is again palpable.   Lab Results  Component Value Date   TSH 2.26 06/18/2017       Assessment & Plan:  Multinodular goiter: recheck today HTN: well-controlled.  Please continue the same medication  Patient Instructions  Let's recheck the ultrasound.  you will receive a phone call, about a day and time for an appointment. Please come back for a follow-up appointment in 1 year.

## 2017-09-10 ENCOUNTER — Telehealth: Payer: Self-pay | Admitting: Endocrinology

## 2017-09-10 NOTE — Telephone Encounter (Signed)
Patient stated that she is suppose to have a ultrasound done on her thyroid and has not heard from anyone. She stated something about her my chart message that she is confused about.  Please advise

## 2017-09-11 NOTE — Telephone Encounter (Signed)
I skyped Molly Wilcox yesterday, but she didn't respond. I am still unsure who I need to be sending these messages to?

## 2017-09-14 NOTE — Telephone Encounter (Signed)
Thank you :)

## 2017-09-14 NOTE — Telephone Encounter (Signed)
Spoke with the pt to let her know that [facility] is awaiting her call back to schedule the referral. She will call

## 2017-10-11 ENCOUNTER — Ambulatory Visit
Admission: RE | Admit: 2017-10-11 | Discharge: 2017-10-11 | Disposition: A | Discharge status: Home / Self Care | Payer: PPO | Source: Ambulatory Visit | Attending: Endocrinology | Admitting: Endocrinology

## 2017-10-11 DIAGNOSIS — E042 Nontoxic multinodular goiter: Secondary | ICD-10-CM

## 2017-11-21 ENCOUNTER — Other Ambulatory Visit: Payer: Self-pay | Admitting: Family Medicine

## 2017-11-21 DIAGNOSIS — I10 Essential (primary) hypertension: Secondary | ICD-10-CM

## 2017-11-27 ENCOUNTER — Ambulatory Visit: Payer: PPO | Admitting: Family Medicine

## 2017-12-05 ENCOUNTER — Other Ambulatory Visit: Payer: Self-pay | Admitting: Family Medicine

## 2017-12-05 DIAGNOSIS — I10 Essential (primary) hypertension: Secondary | ICD-10-CM

## 2017-12-05 MED ORDER — AMLODIPINE BESYLATE 10 MG PO TABS: 10.0000 mg | ORAL_TABLET | Freq: Every day | ORAL | 1 refills | Status: DC

## 2017-12-06 ENCOUNTER — Other Ambulatory Visit: Payer: Self-pay | Admitting: Family Medicine

## 2017-12-06 DIAGNOSIS — M810 Age-related osteoporosis without current pathological fracture: Secondary | ICD-10-CM

## 2017-12-25 ENCOUNTER — Ambulatory Visit (INDEPENDENT_AMBULATORY_CARE_PROVIDER_SITE_OTHER): Payer: PPO | Admitting: Family Medicine

## 2017-12-25 ENCOUNTER — Encounter: Payer: Self-pay | Admitting: Family Medicine

## 2017-12-25 VITALS — BP 130/78 | HR 82 | Temp 97.9°F | Resp 16 | Ht 63.0 in | Wt 143.2 lb

## 2017-12-25 DIAGNOSIS — I10 Essential (primary) hypertension: Secondary | ICD-10-CM

## 2017-12-25 DIAGNOSIS — E042 Nontoxic multinodular goiter: Secondary | ICD-10-CM

## 2017-12-25 DIAGNOSIS — R001 Bradycardia, unspecified: Secondary | ICD-10-CM | POA: Diagnosis not present

## 2017-12-25 DIAGNOSIS — E7800 Pure hypercholesterolemia, unspecified: Secondary | ICD-10-CM

## 2017-12-25 LAB — BASIC METABOLIC PANEL
BUN: 26 mg/dL — AB (ref 6–23)
CHLORIDE: 97 meq/L (ref 96–112)
CO2: 31 meq/L (ref 19–32)
CREATININE: 0.79 mg/dL (ref 0.40–1.20)
Calcium: 9.8 mg/dL (ref 8.4–10.5)
GFR: 74.81 mL/min (ref >60.00)
Glucose, Bld: 107 mg/dL — ABNORMAL HIGH (ref 70–99)
Potassium: 3.6 mEq/L (ref 3.5–5.1)
Sodium: 137 mEq/L (ref 135–145)

## 2017-12-25 NOTE — Assessment & Plan Note (Signed)
Stable. Following annually with Dr. Loanne Drilling.

## 2017-12-25 NOTE — Assessment & Plan Note (Signed)
Resolved after discontinuation of beta-blockers. Instructed about warning signs. Follow-up as needed.

## 2017-12-25 NOTE — Progress Notes (Signed)
HPI:   [name] is a 77 y.o. female, who is here today for 6 months follow up.   She was last seen on 07/24/17 for tick bite.  Since her last visit, her husband has been diagnosed with colon cancer.  He already has history of MS, he opted for no surgical treatment or chemotherapy, he was admitted to hospice.  She was also evaluated by her gynecologist due to uterine prolapse.  According to patient Molly Wilcox were recommended, no surgical treatment was advised at this time.    Hypertension:  Currently on amlodipine 10 mg daily and triamterene-HCTZ 37.5-25 mg daily. Atenolol was weaned off due to bradycardia. She is checking BPs at home, 130s/80s.  She is taking medications as instructed, no side effects reported.  She has not noted unusual headache, visual changes, exertional chest pain, dyspnea,  focal weakness, or edema.   Lab Results  Component Value Date   CREATININE 0.69 06/18/2017   BUN 20 06/18/2017   NA 138 06/18/2017   K 3.7 06/18/2017   CL 99 06/18/2017   CO2 30 06/18/2017     Hyperlipidemia: Currently she is on Zocor 10 mg daily. She is following low fat diet. She is tolerating medication well, denies side effects.  Lab Results  Component Value Date   CHOL 144 09/19/2016   HDL 50.20 09/19/2016   LDLCALC 73 09/19/2016   TRIG 105.0 09/19/2016   CHOLHDL 3 09/19/2016   Since her last visit she also follow with endocrinologist, Dr. Loanne Drilling for multinodular goiter. According to patient, further studies were not recommended at this time and she will continue seeing following annually.  Last US thyroid on 10/11/2017: Left inferior thyroid nodule (labeled 5) has decreased size since the comparison study. Surveillance is reasonable, as the decreasing size suggest benignity.  Isthmic thyroid nodule (labeled 1) meets criteria for surveillance,as designated by the newly established ACR TI-RADS criteria. Surveillance ultrasound study  recommended to be performed annually up to 5 years.    Review of Systems  Constitutional: Negative for activity change, appetite change, fatigue and fever.  HENT: Negative for mouth sores, nosebleeds and trouble swallowing.   Eyes: Negative for redness and visual disturbance.  Respiratory: Negative for cough, shortness of breath and wheezing.   Cardiovascular: Negative for chest pain, palpitations and leg swelling.  Gastrointestinal: Negative for abdominal pain, nausea and vomiting.       Negative for changes in bowel habits.  Genitourinary: Negative for decreased urine volume and hematuria.  Neurological: Negative for syncope, weakness and headaches.  Psychiatric/Behavioral: Positive for sleep disturbance. Negative for confusion. The patient is nervous/anxious.      Current Outpatient Medications on File Prior to Visit  Medication Sig Dispense Refill  . alendronate (FOSAMAX) 70 MG tablet TAKE 1 TABLET BY MOUTH WEEKLY 12 tablet 3  . amLODipine (NORVASC) 10 MG tablet Take 1 tablet (10 mg total) by mouth daily. 90 tablet 1  . aspirin EC 81 MG tablet Take 81 mg by mouth daily.    . Ca Phosphate-Cholecalciferol (CALCIUM/VITAMIN D3 GUMMIES PO) Take 2 gummies by mouth daily.    [name] Kitchen docusate sodium (COLACE) 100 MG capsule Take 100 mg by mouth as needed for mild constipation.    [name] Kitchen doxycycline (VIBRA-TABS) 100 MG tablet Take 1 tablet (100 mg total) by mouth 2 (two) times daily. 14 tablet 0  . MAGNESIUM OXIDE PO Take 250 mg by mouth daily.     . ranitidine (ZANTAC) 150 MG tablet  Take 150 mg by mouth as needed for heartburn.    . simvastatin (ZOCOR) 10 MG tablet TAKE 1 TABLET BY MOUTH EVERYDAY AT BEDTIME 90 tablet 2  . triamterene-hydrochlorothiazide (MAXZIDE-25) 37.5-25 MG tablet TAKE 1 TABLET BY MOUTH EVERY DAY 90 tablet 1   No current facility-administered medications on file prior to visit.      Past Medical History:  Diagnosis Date  . High cholesterol   . Hypertension   .  Osteoarthritis   . Raynaud disease    Allergies  Allergen Reactions  . Prednisone Other (See Comments)    Dehydration, amnesia   . Ibuprofen Nausea Only and Other (See Comments)    Nausea & headaches    Social History   Socioeconomic History  . Marital status: Married    Spouse name: Not on file  . Number of children: Not on file  . Years of education: Not on file  . Highest education level: Not on file  Occupational History  . Not on file  Social Needs  . Financial resource strain: Not on file  . Food insecurity:    Worry: Not on file    Inability: Not on file  . Transportation needs:    Medical: Not on file    Non-medical: Not on file  Tobacco Use  . Smoking status: Never Smoker  . Smokeless tobacco: Never Used  Substance and Sexual Activity  . Alcohol use: Yes  . Drug use: No  . Sexual activity: Not Currently  Lifestyle  . Physical activity:    Days per week: Not on file    Minutes per session: Not on file  . Stress: Not on file  Relationships  . Social connections:    Talks on phone: Not on file    Gets together: Not on file    Attends religious service: Not on file    Active member of club or organization: Not on file    Attends meetings of clubs or organizations: Not on file    Relationship status: Not on file  Other Topics Concern  . Not on file  Social History Narrative  . Not on file    Vitals:   12/25/17 0954  BP: 130/78  Pulse: 82  Resp: 16  Temp: 97.9 F (36.6 C)  SpO2: 97%   Body mass index is 25.38 kg/m.   Physical Exam  Nursing note and vitals reviewed. Constitutional: She is oriented to person, place, and time. She appears well-developed and well-nourished. No distress.  HENT:  Head: Normocephalic and atraumatic.  Mouth/Throat: Oropharynx is clear and moist and mucous membranes are normal.  Eyes: Pupils are equal, round, and reactive to light. Conjunctivae are normal.  Cardiovascular: Normal rate and regular rhythm.  No  murmur heard. Pulses:      Dorsalis pedis pulses are 2+ on the right side, and 2+ on the left side.  Respiratory: Effort normal and breath sounds normal. No respiratory distress.  GI: Soft. She exhibits no mass. There is no hepatomegaly. There is no tenderness.  Musculoskeletal: She exhibits no edema.  Lymphadenopathy:    She has no cervical adenopathy.  Neurological: She is alert and oriented to person, place, and time. She has normal strength. No cranial nerve deficit. Gait normal.  Skin: Skin is warm. No rash noted. No erythema.  Psychiatric: She has a normal mood and affect.  Well groomed, good eye contact.       ASSESSMENT AND PLAN:   Molly Wilcox was seen  today for 6 months follow-up.  Orders Placed This Encounter  Procedures  . Basic metabolic panel   Lab Results  Component Value Date   CREATININE 0.79 12/25/2017   BUN 26 (H) 12/25/2017   NA 137 12/25/2017   K 3.6 12/25/2017   CL 97 12/25/2017   CO2 31 12/25/2017    Essential hypertension BP well controlled. No changes in current management. Eye exam is current. Continue low-salt diet. She would like to continue following annually, which I think is appropriate. Instructed to monitor BP periodically. Follow-up in 1 year, before if needed.  Pure hypercholesterolemia She is not fasting today but since her cholesterol was normal in 09/2016, I think is appropriate to recheck next year. No changes in Zocor 10 mg daily. Continue low-fat diet.  Bradycardia Resolved after discontinuation of beta-blockers. Instructed about warning signs. Follow-up as needed.    25 min face to face OV. > 50% was dedicated to discussion of Dx, prognosis, and side effects of medications. She will continue annual visits.      Anaclara Acklin G. Martinique, MD  [facility]. [place] office.

## 2017-12-25 NOTE — Assessment & Plan Note (Signed)
BP well controlled. No changes in current management. Eye exam is current. Continue low-salt diet. She would like to continue following annually, which I think is appropriate. Instructed to monitor BP periodically. Follow-up in 1 year, before if needed.

## 2017-12-25 NOTE — Patient Instructions (Signed)
A few things to remember from today's visit:   Essential hypertension - Plan: Basic metabolic panel   [name], today we have followed on some of your chronic medical problems and they seem to be stable, so no changes in current management today.  Review medication list and be sure it is accurate.  -Remember a healthy diet and regular physical activity are very important for prevention as well as for well being; they also help with many chronic problems, decreasing the need of adding new medications and delaying or preventing possible complications.  I will see you back in 12 months.  Remember to arrange your follow up appt before leaving today.  Please follow sooner than planned if a new concern arises.  Please be sure medication list is accurate. If a new problem present, please set up appointment sooner than planned today.

## 2017-12-25 NOTE — Assessment & Plan Note (Signed)
She is not fasting today but since her cholesterol was normal in 09/2016, I think is appropriate to recheck next year. No changes in Zocor 10 mg daily. Continue low-fat diet.

## 2017-12-28 ENCOUNTER — Encounter: Payer: Self-pay | Admitting: Family Medicine

## 2018-01-03 ENCOUNTER — Ambulatory Visit: Payer: PPO | Admitting: Endocrinology

## 2018-01-05 ENCOUNTER — Encounter: Payer: Self-pay | Admitting: Family Medicine

## 2018-03-04 DIAGNOSIS — Z1231 Encounter for screening mammogram for malignant neoplasm of breast: Secondary | ICD-10-CM | POA: Diagnosis not present

## 2018-03-04 LAB — HM MAMMOGRAPHY

## 2018-03-18 ENCOUNTER — Encounter: Payer: Self-pay | Admitting: Family Medicine

## 2018-05-17 ENCOUNTER — Other Ambulatory Visit: Payer: Self-pay | Admitting: Family Medicine

## 2018-05-17 DIAGNOSIS — I10 Essential (primary) hypertension: Secondary | ICD-10-CM

## 2018-05-17 DIAGNOSIS — E7800 Pure hypercholesterolemia, unspecified: Secondary | ICD-10-CM

## 2018-05-28 ENCOUNTER — Other Ambulatory Visit: Payer: Self-pay | Admitting: Family Medicine

## 2018-06-03 ENCOUNTER — Ambulatory Visit (INDEPENDENT_AMBULATORY_CARE_PROVIDER_SITE_OTHER): Payer: PPO | Admitting: Family Medicine

## 2018-06-03 ENCOUNTER — Encounter: Payer: Self-pay | Admitting: Family Medicine

## 2018-06-03 VITALS — BP 130/78 | HR 87 | Temp 97.7°F | Resp 12 | Ht 63.0 in | Wt 141.0 lb

## 2018-06-03 DIAGNOSIS — H9193 Unspecified hearing loss, bilateral: Secondary | ICD-10-CM | POA: Diagnosis not present

## 2018-06-03 DIAGNOSIS — H6123 Impacted cerumen, bilateral: Secondary | ICD-10-CM

## 2018-06-03 NOTE — Progress Notes (Signed)
ACUTE VISIT   HPI:  Chief Complaint  Patient presents with  . Hearing Problem    patient complains of difficulty hearing x2 days,suspected wax and used OTC drops with no relief    [name] is a 78 y.o. female, who is here today complaining of 2 to 3 days of sudden hearing loss, bilateral. She denies history of trauma. Problem is constant. Negative for headache, earache, drainage, sore throat, rhinorrhea, or nasal congestion.  She tried OTC eardrops, worsening symptoms. She also has been using Q-tips.  She had an URI 3 to 4 weeks ago. No recent travel.   She was last seen here in the office on 12/25/2017. Since her last visit her husband passed away due to colon cancer, around Thanksgiving.  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 She is dealing well with her loss, she feels relieved.   Review of Systems  HENT: Positive for hearing loss. Negative for congestion, ear discharge, ear pain, sore throat and tinnitus.   Respiratory: Negative for cough, shortness of breath and wheezing.   Gastrointestinal: Negative for nausea and vomiting.  Neurological: Negative for weakness and headaches.   Current Outpatient Medications on File Prior to Visit  Medication Sig Dispense Refill  . alendronate (FOSAMAX) 70 MG tablet TAKE 1 TABLET BY MOUTH WEEKLY 12 tablet 3  . amLODipine (NORVASC) 10 MG tablet TAKE 1 TABLET BY MOUTH EVERY DAY 90 tablet 1  . aspirin EC 81 MG tablet Take 81 mg by mouth daily.    . Ca Phosphate-Cholecalciferol (CALCIUM/VITAMIN D3 GUMMIES PO) Take 2 gummies by mouth daily.    [name] Kitchen docusate sodium (COLACE) 100 MG capsule Take 100 mg by mouth as needed for mild constipation.    [name] Kitchen MAGNESIUM OXIDE PO Take 250 mg by mouth daily.     . simvastatin (ZOCOR) 10 MG tablet TAKE 1 TABLET BY MOUTH EVERYDAY AT BEDTIME 90 tablet 2  . triamterene-hydrochlorothiazide (MAXZIDE-25) 37.5-25 MG tablet TAKE 1 TABLET BY MOUTH EVERY DAY 90 tablet 1  . Wheat  Dextrin (BENEFIBER PO) Take by mouth.     No current facility-administered medications on file prior to visit.      Past Medical History:  Diagnosis Date  . High cholesterol   . Hypertension   . Osteoarthritis   . Raynaud disease    Allergies  Allergen Reactions  . Prednisone Other (See Comments)    Dehydration, amnesia   . Ibuprofen Nausea Only and Other (See Comments)    Nausea & headaches    Social History   Socioeconomic History  . Marital status: Widowed    Spouse name: Not on file  . Number of children: Not on file  . Years of education: Not on file  . Highest education level: Not on file  Occupational History  . Not on file  Social Needs  . Financial resource strain: Not on file  . Food insecurity:    Worry: Not on file    Inability: Not on file  . Transportation needs:    Medical: No    Non-medical: No  Tobacco Use  . Smoking status: Never Smoker  . Smokeless tobacco: Never Used  Substance and Sexual Activity  . Alcohol use: Yes  . Drug use: No  . Sexual activity: Not Currently  Lifestyle  . Physical activity:    Days per week: Not on file    Minutes per session: Not on file  . Stress: Not on  file  Relationships  . Social connections:    Talks on phone: Not on file    Gets together: Not on file    Attends religious service: Not on file    Active member of club or organization: Not on file    Attends meetings of clubs or organizations: Not on file    Relationship status: Not on file  Other Topics Concern  . Not on file  Social History Narrative  . Not on file    Vitals:   06/03/18 1548  BP: 130/78  Pulse: 87  Resp: 12  Temp: 97.7 F (36.5 C)   Body mass index is 24.98 kg/m.  Physical Exam  Nursing note and vitals reviewed. Constitutional: She is oriented to person, place, and time. She appears well-developed and well-nourished. No distress.  HENT:  Head: Normocephalic and atraumatic.  Right Ear: External ear normal. Decreased  hearing is noted.  Left Ear: External ear normal. Decreased hearing is noted.  Excess cerumen bilaterally, could not appreciate TMs.  Eyes: Conjunctivae are normal.  Respiratory: Effort normal. No respiratory distress.  Lymphadenopathy:       Head (right side): No preauricular and no posterior auricular adenopathy present.       Head (left side): No preauricular and no posterior auricular adenopathy present.    She has no cervical adenopathy.  Neurological: She is alert and oriented to person, place, and time. She has normal strength. No cranial nerve deficit.  Otherwise stable gait with no assistance.  Psychiatric: She has a normal mood and affect.  Well-groomed, good eye contact.    ASSESSMENT AND PLAN:  [name] was seen today for hearing problem.  Diagnoses and all orders for this visit:  Impacted cerumen of both ears  Bilateral hearing loss, unspecified hearing loss type  After discussing risk versus benefits of ear lavage, she agrees with proceeding with procedure. She tolerated procedure well. No cerumen left bilateral, resolution of symptoms (Gross hearing intact). TM normal bilateral.  Recommend avoiding Q-tips.   Return if symptoms worsen or fail to improve.     Reyaansh Merlo G. Martinique, MD  [facility]. [place] office.

## 2018-06-03 NOTE — Patient Instructions (Signed)
A few things to remember from today's visit:   Impacted cerumen of both ears  Bilateral hearing loss, unspecified hearing loss type   Earwax Buildup, Adult The ears produce a substance called earwax that helps keep bacteria out of the ear and protects the skin in the ear canal. Occasionally, earwax can build up in the ear and cause discomfort or hearing loss. What increases the risk? This condition is more likely to develop in people who:  Are female.  Are elderly.  Naturally produce more earwax.  Clean their ears often with cotton swabs.  Use earplugs often.  Use in-ear headphones often.  Wear hearing aids.  Have narrow ear canals.  Have earwax that is overly thick or sticky.  Have eczema.  Are dehydrated.  Have excess hair in the ear canal. What are the signs or symptoms? Symptoms of this condition include:  Reduced or muffled hearing.  A feeling of fullness in the ear or feeling that the ear is plugged.  Fluid coming from the ear.  Ear pain.  Ear itch.  Ringing in the ear.  Coughing.  An obvious piece of earwax that can be seen inside the ear canal. How is this diagnosed? This condition may be diagnosed based on:  Your symptoms.  Your medical history.  An ear exam. During the exam, your health care provider will look into your ear with an instrument called an otoscope. You may have tests, including a hearing test. How is this treated? This condition may be treated by:  Using ear drops to soften the earwax.  Having the earwax removed by a health care provider. The health care provider may: ? Flush the ear with water. ? Use an instrument that has a loop on the end (curette). ? Use a suction device.  Surgery to remove the wax buildup. This may be done in severe cases. Follow these instructions at home:   Take over-the-counter and prescription medicines only as told by your health care provider.  Do not put any objects, including cotton  swabs, into your ear. You can clean the opening of your ear canal with a washcloth or facial tissue.  Follow instructions from your health care provider about cleaning your ears. Do not over-clean your ears.  Drink enough fluid to keep your urine clear or pale yellow. This will help to thin the earwax.  Keep all follow-up visits as told by your health care provider. If earwax builds up in your ears often or if you use hearing aids, consider seeing your health care provider for routine, preventive ear cleanings. Ask your health care provider how often you should schedule your cleanings.  If you have hearing aids, clean them according to instructions from the manufacturer and your health care provider. Contact a health care provider if:  You have ear pain.  You develop a fever.  You have blood, pus, or other fluid coming from your ear.  You have hearing loss.  You have ringing in your ears that does not go away.  Your symptoms do not improve with treatment.  You feel like the room is spinning (vertigo). Summary  Earwax can build up in the ear and cause discomfort or hearing loss.  The most common symptoms of this condition include reduced or muffled hearing and a feeling of fullness in the ear or feeling that the ear is plugged.  This condition may be diagnosed based on your symptoms, your medical history, and an ear exam.  This condition may be  treated by using ear drops to soften the earwax or by having the earwax removed by a health care provider.  Do not put any objects, including cotton swabs, into your ear. You can clean the opening of your ear canal with a washcloth or facial tissue. This information is not intended to replace advice given to you by your health care provider. Make sure you discuss any questions you have with your health care provider. Document Released: 04/27/2004 Document Revised: 03/01/2017 Document Reviewed: 05/31/2016 Elsevier Interactive Patient  Education  2019 [employer].  Please be sure medication list is accurate. If a new problem present, please set up appointment sooner than planned today.

## 2018-09-03 ENCOUNTER — Other Ambulatory Visit: Payer: Self-pay

## 2018-09-05 ENCOUNTER — Encounter: Payer: Self-pay | Admitting: Endocrinology

## 2018-09-05 ENCOUNTER — Ambulatory Visit (INDEPENDENT_AMBULATORY_CARE_PROVIDER_SITE_OTHER): Payer: PPO | Admitting: Endocrinology

## 2018-09-05 ENCOUNTER — Other Ambulatory Visit: Payer: Self-pay

## 2018-09-05 VITALS — BP 142/82 | HR 74 | Temp 98.1°F | Wt 138.8 lb

## 2018-09-05 DIAGNOSIS — E042 Nontoxic multinodular goiter: Secondary | ICD-10-CM | POA: Diagnosis not present

## 2018-09-05 NOTE — Progress Notes (Addendum)
Subjective:    Patient ID: Molly Wilcox, female    DOB: Jun 06, 1940, 78 y.o.   MRN: [id number]  HPI Pt returns for f/u of multinodular goiter (dx'ed 2018; bx then of LLP nodule showed BENIGN FOLLICULAR NODULE (BETHESDA CATEGORY II); she has been euthyroid off rx; [place] in 2019 was unchanged).  pt states she feels well in general.  She does not notice the goiter.   Past Medical History:  Diagnosis Date  . High cholesterol   . Hypertension   . Osteoarthritis   . Raynaud disease     Past Surgical History:  Procedure Laterality Date  . TUBAL LIGATION      Social History   Socioeconomic History  . Marital status: Widowed    Spouse name: Not on file  . Number of children: Not on file  . Years of education: Not on file  . Highest education level: Not on file  Occupational History  . Not on file  Social Needs  . Financial resource strain: Not on file  . Food insecurity:    Worry: Not on file    Inability: Not on file  . Transportation needs:    Medical: No    Non-medical: No  Tobacco Use  . Smoking status: Never Smoker  . Smokeless tobacco: Never Used  Substance and Sexual Activity  . Alcohol use: Yes  . Drug use: No  . Sexual activity: Not Currently  Lifestyle  . Physical activity:    Days per week: Not on file    Minutes per session: Not on file  . Stress: Not on file  Relationships  . Social connections:    Talks on phone: Not on file    Gets together: Not on file    Attends religious service: Not on file    Active member of club or organization: Not on file    Attends meetings of clubs or organizations: Not on file    Relationship status: Not on file  . Intimate partner violence:    Fear of current or ex partner: Not on file    Emotionally abused: Not on file    Physically abused: Not on file    Forced sexual activity: Not on file  Other Topics Concern  . Not on file  Social History Narrative  . Not on file    Current Outpatient Medications on File  Prior to Visit  Medication Sig Dispense Refill  . alendronate (FOSAMAX) 70 MG tablet TAKE 1 TABLET BY MOUTH WEEKLY 12 tablet 3  . amLODipine (NORVASC) 10 MG tablet TAKE 1 TABLET BY MOUTH EVERY DAY 90 tablet 1  . aspirin EC 81 MG tablet Take 81 mg by mouth daily.    . Ca Phosphate-Cholecalciferol (CALCIUM/VITAMIN D3 GUMMIES PO) Take 2 gummies by mouth daily.    [name] Kitchen docusate sodium (COLACE) 100 MG capsule Take 100 mg by mouth as needed for mild constipation.    [name] Kitchen MAGNESIUM OXIDE PO Take 250 mg by mouth daily.     . simvastatin (ZOCOR) 10 MG tablet TAKE 1 TABLET BY MOUTH EVERYDAY AT BEDTIME 90 tablet 2  . triamterene-hydrochlorothiazide (MAXZIDE-25) 37.5-25 MG tablet TAKE 1 TABLET BY MOUTH EVERY DAY 90 tablet 1  . Wheat Dextrin (BENEFIBER PO) Take by mouth.     No current facility-administered medications on file prior to visit.     Allergies  Allergen Reactions  . Prednisone Other (See Comments)    Dehydration, amnesia   . Ibuprofen Nausea Only and Other (See  Comments)    Nausea & headaches    Family History  Problem Relation Age of Onset  . Heart disease Father   . Thyroid disease Neg Hx     BP (!) 142/82 (BP Location: Left Arm, Patient Position: Sitting, Cuff Size: Normal)   Pulse 74   Temp 98.1 F (36.7 C) (Oral)   Wt 138 lb 12.8 oz (63 kg)   SpO2 97%   BMI 24.59 kg/m    Review of Systems Denies neck pain.      Objective:   Physical Exam VS: see vs page GEN: no distress NECK: thyroid is slightly enlarged, with irreg surface, but no palpable nodule.    Lab Results  Component Value Date   TSH 1.81 09/05/2018       Assessment & Plan:  HTN: is noted today MNG: euthyroid. due for recheck  Patient Instructions  Your blood pressure is high today.  Please see your primary care provider soon, to have it rechecked.   Let's recheck the ultrasound.  you will receive a phone call, about a day and time for an appointment.   Please come back for a follow-up appointment  in 1 year.

## 2018-09-05 NOTE — Patient Instructions (Addendum)
Your blood pressure is high today.  Please see your primary care provider soon, to have it rechecked.   Let's recheck the ultrasound.  you will receive a phone call, about a day and time for an appointment.   Please come back for a follow-up appointment in 1 year.

## 2018-09-06 LAB — TSH: TSH: 1.81 u[IU]/mL (ref 0.35–4.50)

## 2018-09-25 ENCOUNTER — Other Ambulatory Visit: Payer: Self-pay

## 2018-09-25 ENCOUNTER — Ambulatory Visit
Admission: RE | Admit: 2018-09-25 | Discharge: 2018-09-25 | Disposition: A | Discharge status: Home / Self Care | Payer: PPO | Source: Ambulatory Visit | Attending: Endocrinology | Admitting: Endocrinology

## 2018-09-25 DIAGNOSIS — E042 Nontoxic multinodular goiter: Secondary | ICD-10-CM | POA: Diagnosis not present

## 2018-10-01 ENCOUNTER — Encounter: Payer: Self-pay | Admitting: Endocrinology

## 2018-10-01 NOTE — Telephone Encounter (Signed)
Please review and advise.

## 2018-10-05 ENCOUNTER — Encounter: Payer: Self-pay | Admitting: Endocrinology

## 2018-10-07 ENCOUNTER — Encounter: Payer: Self-pay | Admitting: Endocrinology

## 2018-10-07 NOTE — Telephone Encounter (Signed)
Please advise 

## 2018-10-08 ENCOUNTER — Other Ambulatory Visit: Payer: Self-pay | Admitting: Endocrinology

## 2018-10-08 DIAGNOSIS — E042 Nontoxic multinodular goiter: Secondary | ICD-10-CM

## 2018-10-08 NOTE — Telephone Encounter (Signed)
Would you rather do this before or after your August vacation? How much time would you prefer I block (especially given she is requesting a morning appt; our busiest time)

## 2018-10-10 ENCOUNTER — Other Ambulatory Visit: Payer: Self-pay | Admitting: Neurology

## 2018-10-29 ENCOUNTER — Ambulatory Visit
Admission: RE | Admit: 2018-10-29 | Discharge: 2018-10-29 | Disposition: A | Discharge status: Home / Self Care | Payer: PPO | Source: Ambulatory Visit | Attending: Endocrinology | Admitting: Endocrinology

## 2018-10-29 ENCOUNTER — Other Ambulatory Visit (HOSPITAL_COMMUNITY)
Admission: RE | Admit: 2018-10-29 | Discharge: 2018-10-29 | Disposition: A | Discharge status: Home / Self Care | Payer: PPO | Source: Ambulatory Visit | Attending: Radiology | Admitting: Radiology

## 2018-10-29 DIAGNOSIS — E042 Nontoxic multinodular goiter: Secondary | ICD-10-CM

## 2018-10-29 DIAGNOSIS — D34 Benign neoplasm of thyroid gland: Secondary | ICD-10-CM | POA: Insufficient documentation

## 2018-10-29 DIAGNOSIS — E041 Nontoxic single thyroid nodule: Secondary | ICD-10-CM | POA: Diagnosis not present

## 2018-10-31 ENCOUNTER — Other Ambulatory Visit: Payer: Self-pay

## 2018-11-17 ENCOUNTER — Other Ambulatory Visit: Payer: Self-pay | Admitting: Family Medicine

## 2018-11-17 DIAGNOSIS — I10 Essential (primary) hypertension: Secondary | ICD-10-CM

## 2018-12-24 ENCOUNTER — Other Ambulatory Visit: Payer: Self-pay | Admitting: Family Medicine

## 2018-12-24 DIAGNOSIS — M810 Age-related osteoporosis without current pathological fracture: Secondary | ICD-10-CM

## 2019-01-07 ENCOUNTER — Encounter: Payer: Self-pay | Admitting: Family Medicine

## 2019-01-14 ENCOUNTER — Encounter: Payer: Self-pay | Admitting: Family Medicine

## 2019-01-14 ENCOUNTER — Other Ambulatory Visit: Payer: Self-pay

## 2019-01-14 ENCOUNTER — Ambulatory Visit (INDEPENDENT_AMBULATORY_CARE_PROVIDER_SITE_OTHER): Payer: PPO | Admitting: Family Medicine

## 2019-01-14 VITALS — BP 140/76 | HR 76 | Temp 97.8°F | Resp 12 | Ht 63.0 in | Wt 138.1 lb

## 2019-01-14 DIAGNOSIS — R7303 Prediabetes: Secondary | ICD-10-CM

## 2019-01-14 DIAGNOSIS — M816 Localized osteoporosis [Lequesne]: Secondary | ICD-10-CM | POA: Diagnosis not present

## 2019-01-14 DIAGNOSIS — I10 Essential (primary) hypertension: Secondary | ICD-10-CM

## 2019-01-14 DIAGNOSIS — Z0000 Encounter for general adult medical examination without abnormal findings: Secondary | ICD-10-CM

## 2019-01-14 DIAGNOSIS — E7800 Pure hypercholesterolemia, unspecified: Secondary | ICD-10-CM | POA: Diagnosis not present

## 2019-01-14 LAB — COMPREHENSIVE METABOLIC PANEL
ALT: 20 U/L (ref 0–35)
AST: 20 U/L (ref 0–37)
Albumin: 4.8 g/dL (ref 3.5–5.2)
Alkaline Phosphatase: 64 U/L (ref 39–117)
BUN: 24 mg/dL — ABNORMAL HIGH (ref 6–23)
CO2: 30 mEq/L (ref 19–32)
Calcium: 10 mg/dL (ref 8.4–10.5)
Chloride: 99 mEq/L (ref 96–112)
Creatinine, Ser: 0.83 mg/dL (ref 0.40–1.20)
GFR: 66.31 mL/min (ref >60.00)
Glucose, Bld: 107 mg/dL — ABNORMAL HIGH (ref 70–99)
Potassium: 3.9 mEq/L (ref 3.5–5.1)
Sodium: 138 mEq/L (ref 135–145)
Total Bilirubin: 0.6 mg/dL (ref 0.2–1.2)
Total Protein: 7.1 g/dL (ref 6.0–8.3)

## 2019-01-14 LAB — LIPID PANEL
Cholesterol: 165 mg/dL (ref 0–200)
HDL: 63.2 mg/dL (ref >39.00)
LDL Cholesterol: 80 mg/dL (ref 0–99)
NonHDL: 102.16
Total CHOL/HDL Ratio: 3
Triglycerides: 111 mg/dL (ref 0.0–149.0)
VLDL: 22.2 mg/dL (ref 0.0–40.0)

## 2019-01-14 LAB — HEMOGLOBIN A1C: Hgb A1c MFr Bld: 6.2 % (ref 4.6–6.5)

## 2019-01-14 NOTE — Progress Notes (Addendum)
HPI:   [name] is a 78 y.o. female, who is here today for chronic disease management,CPE,t and medicare preventive visit. Last AWV 03/21/2016. Last CPE:11/21/16.   She lives alone, her husband died last year. Independent ADL's and IADL's. No falls in the past year and denies depression symptoms.  She is walking 10,000 steps daily and following a healthful diet.  Mammogram:03/04/2018. Colonoscopy: 05/2013. DEXA: 02/27/2017. She is on Fosamax,started 1.5 years. She is tolerating medication well.  Immunization History  Administered Date(s) Administered  . Fluad Quad(high Dose 65+) 12/25/2018  . Influenza, High Dose Seasonal PF 01/02/2016, 01/29/2017, 01/05/2018  . Influenza-Unspecified 01/29/2017, 01/01/2018, 12/25/2018  . Pneumococcal Conjugate-13 03/30/2014  . Pneumococcal Polysaccharide-23 12/29/2014  . Tdap 09/27/2016    She enjoys photography and has a cooking club,meetings using [employer].  Functional Status Survey: Is the patient deaf or have difficulty hearing?: No Does the patient have difficulty seeing, even when wearing glasses/contacts?: No Does the patient have difficulty concentrating, remembering, or making decisions?: No Does the patient have difficulty walking or climbing stairs?: No Does the patient have difficulty dressing or bathing?: No Does the patient have difficulty doing errands alone such as visiting a doctor's office or shopping?: No  Fall Risk  01/14/2019 03/21/2016  Falls in the past year? 0 No  Number falls in past yr: 0 -  Injury with Fall? 0 -  Follow up Education provided -    Providers she sees regularly: Eye care provider: Dr Wallis Mart. Endocrinologist, Dr Loanne Drilling, 09/2018. Gyn at [facility], last seen in 09/2017.  Depression screen [facility] 2/9 01/14/2019  Decreased Interest 0  Down, Depressed, Hopeless 0  PHQ - 2 Score 0    Mini-Cog - 01/14/19 1044    Normal clock drawing test?  yes    How many  words correct?  3        Hearing Screening   125Hz  250Hz  500Hz  1000Hz  2000Hz  3000Hz  4000Hz  6000Hz  8000Hz   Right ear:           Left ear:             Visual Acuity Screening   Right eye Left eye Both eyes  Without correction: 20/40 20/50 20/30   With correction:       Prediabetes: Lab Results  Component Value Date   HGBA1C 6.2 06/18/2017   Denies abdominal pain, nausea,vomiting, polydipsia,polyuria, or polyphagia.  HTN: Denies severe/frequent headache, visual changes, chest pain, dyspnea, palpitation, claudication, focal weakness, or edema. She is on amlodipine 10 mg daily and  triamterene-HCTZ 37.5-25 mg daily,  She is not checking BP regularly.  Lab Results  Component Value Date   CREATININE 0.79 12/25/2017   BUN 26 (H) 12/25/2017   NA 137 12/25/2017   K 3.6 12/25/2017   CL 97 12/25/2017   CO2 31 12/25/2017   Hyperlipidemia: Currently she is on simvastatin 10 mg daily.  Lab Results  Component Value Date   CHOL 144 09/19/2016   HDL 50.20 09/19/2016   LDLCALC 73 09/19/2016   TRIG 105.0 09/19/2016   CHOLHDL 3 09/19/2016   Review of Systems  Constitutional: Negative for activity change, appetite change, fatigue and fever.  HENT: Negative for mouth sores, nosebleeds, sore throat and trouble swallowing.   Eyes: Negative for redness and visual disturbance.  Respiratory: Negative for cough and wheezing.   Cardiovascular:       Negative for orthopnea and PND.  Gastrointestinal: Negative for abdominal pain, nausea and vomiting.  Negative for changes in bowel habits.  Endocrine: Negative for cold intolerance and heat intolerance.  Genitourinary: Negative for decreased urine volume and hematuria.  Musculoskeletal: Negative for gait problem and myalgias.  Neurological: Negative for syncope and facial asymmetry.  Psychiatric/Behavioral: Negative for confusion. The patient is not nervous/anxious.   Rest see pertinent positives and negatives per HPI.   Current  Outpatient Medications on File Prior to Visit  Medication Sig Dispense Refill  . alendronate (FOSAMAX) 70 MG tablet TAKE 1 TABLET BY MOUTH ONE TIME PER WEEK 12 tablet 3  . amLODipine (NORVASC) 10 MG tablet TAKE 1 TABLET BY MOUTH EVERY DAY 90 tablet 1  . aspirin EC 81 MG tablet Take 81 mg by mouth daily.    . Ca Phosphate-Cholecalciferol (CALCIUM/VITAMIN D3 GUMMIES PO) Take 2 gummies by mouth daily.    [name] Kitchen docusate sodium (COLACE) 100 MG capsule Take 100 mg by mouth as needed for mild constipation.    [name] Kitchen MAGNESIUM OXIDE PO Take 250 mg by mouth daily.     . simvastatin (ZOCOR) 10 MG tablet TAKE 1 TABLET BY MOUTH EVERYDAY AT BEDTIME 90 tablet 2  . triamterene-hydrochlorothiazide (MAXZIDE-25) 37.5-25 MG tablet TAKE 1 TABLET BY MOUTH EVERY DAY 90 tablet 1  . Wheat Dextrin (BENEFIBER PO) Take by mouth.     No current facility-administered medications on file prior to visit.      Past Medical History:  Diagnosis Date  . High cholesterol   . Hypertension   . Osteoarthritis   . Raynaud disease    Allergies  Allergen Reactions  . Prednisone Other (See Comments)    Dehydration, amnesia   . Ibuprofen Nausea Only and Other (See Comments)    Nausea & headaches    Social History   Socioeconomic History  . Marital status: Widowed    Spouse name: Not on file  . Number of children: Not on file  . Years of education: Not on file  . Highest education level: Not on file  Occupational History  . Not on file  Social Needs  . Financial resource strain: Not on file  . Food insecurity    Worry: Not on file    Inability: Not on file  . Transportation needs    Medical: No    Non-medical: No  Tobacco Use  . Smoking status: Never Smoker  . Smokeless tobacco: Never Used  Substance and Sexual Activity  . Alcohol use: Yes  . Drug use: No  . Sexual activity: Not Currently  Lifestyle  . Physical activity    Days per week: Not on file    Minutes per session: Not on file  . Stress: Not on file   Relationships  . Social Herbalist on phone: Not on file    Gets together: Not on file    Attends religious service: Not on file    Active member of club or organization: Not on file    Attends meetings of clubs or organizations: Not on file    Relationship status: Not on file  Other Topics Concern  . Not on file  Social History Narrative  . Not on file    Vitals:   01/14/19 1004  BP: 140/76  Pulse: 76  Resp: 12  Temp: 97.8 F (36.6 C)  SpO2: 98%   Body mass index is 24.47 kg/m.   Physical Exam  Nursing note and vitals reviewed. Constitutional: She is oriented to person, place, and time. She appears well-developed. No distress.  HENT:  Head: Normocephalic and atraumatic.  Mouth/Throat: Oropharynx is clear and moist and mucous membranes are normal.  Eyes: Pupils are equal, round, and reactive to light. Conjunctivae are normal.  Cardiovascular: Normal rate and regular rhythm.  Murmur (soft SEM RUSB) heard. Pulses:      Dorsalis pedis pulses are 2+ on the right side and 2+ on the left side.  Respiratory: Effort normal and breath sounds normal. No respiratory distress.  GI: Soft. She exhibits no mass. There is no hepatomegaly. There is no abdominal tenderness.  Musculoskeletal:        General: No edema.  Lymphadenopathy:    She has no cervical adenopathy.  Neurological: She is alert and oriented to person, place, and time. She has normal strength. No cranial nerve deficit. Gait normal.  Skin: Skin is warm. No rash noted. No erythema.  Psychiatric: She has a normal mood and affect.  Well groomed, good eye contact.    ASSESSMENT AND PLAN:  Molly Wilcox was seen today for annual exam.  Diagnoses and all orders for this visit:  Lab Results  Component Value Date   HGBA1C 6.2 01/14/2019   Lab Results  Component Value Date   CREATININE 0.83 01/14/2019   BUN 24 (H) 01/14/2019   NA 138 01/14/2019   K 3.9 01/14/2019   CL 99 01/14/2019   CO2 30  01/14/2019   Lab Results  Component Value Date   CHOL 165 01/14/2019   HDL 63.20 01/14/2019   LDLCALC 80 01/14/2019   TRIG 111.0 01/14/2019   CHOLHDL 3 01/14/2019   Lab Results  Component Value Date   ALT 20 01/14/2019   AST 20 01/14/2019   ALKPHOS 64 01/14/2019   BILITOT 0.6 01/14/2019    Medicare annual wellness visit, subsequent We discussed the importance of staying active, physically and mentally, as well as the benefits of a healthy/balance diet. Low impact exercise that involve stretching and strengthing are ideal. Vaccines up to date. We discussed preventive screening for the next 5-10 years, summery of recommendations on AVS. DEXA in 2021. Mammogram 03/2019. Influenza vaccine annually. Fall prevention.  Advance directives and end of life discussed, she has living will but no POA, advance health directives package given.    Essential hypertension BP adequately controlled. No changes in current management. Continue low salt diet. Monitor BP regularly.  -     Comprehensive metabolic panel  Pure hypercholesterolemia Continue Zocor 10 mg daily and low salt diet. Zocor dose will be adjusted according to FLP results.  -     Comprehensive metabolic panel -     Lipid panel  Prediabetes Healthy life style for primary prevention recommended. Further recommendations according to HgA1C results.  -     Hemoglobin A1c  Localized osteoporosis without current pathological fracture Continue Fosamax 70 mg weekly to complete 5 years. Fall precautions. Regular physical activity, 2 times per week weightbearing exercise. Ca++ and vit D supplementation to continue.    Return in about 6 months (around 07/15/2019) for cpe/f/u.    -Molly Wilcox was advised to return sooner than planned today if new concerns arise.    Susannah Carbin G. Martinique, MD  [facility]. [place] office.

## 2019-01-14 NOTE — Patient Instructions (Addendum)
  Molly Wilcox , Thank you for taking time to come for your Medicare Wellness Visit. I appreciate your ongoing commitment to your health goals. Please review the following plan we discussed and let me know if I can assist you in the future.   These are the goals we discussed: Goals    . patient     Continue regular exercise.        This is a list of the screening recommended for you and due dates:  Health Maintenance  Topic Date Due  . Tetanus Vaccine  09/28/2026  . Flu Shot  Completed  . DEXA scan (bone density measurement)  Completed  . Pneumonia vaccines  Completed   A few things to remember from today's visit:  Essential hypertension - Plan: Comprehensive metabolic panel  Pure hypercholesterolemia - Plan: Comprehensive metabolic panel, Lipid panel  Medicare annual wellness visit, subsequent  Prediabetes - Plan: Hemoglobin A1c   Please be sure medication list is accurate. If a new problem present, please set up appointment sooner than planned today.

## 2019-01-16 ENCOUNTER — Encounter: Payer: Self-pay | Admitting: Family Medicine

## 2019-01-28 ENCOUNTER — Encounter: Payer: Self-pay | Admitting: Family Medicine

## 2019-02-16 ENCOUNTER — Other Ambulatory Visit: Payer: Self-pay | Admitting: Family Medicine

## 2019-02-16 DIAGNOSIS — E7800 Pure hypercholesterolemia, unspecified: Secondary | ICD-10-CM

## 2019-03-06 DIAGNOSIS — Z1231 Encounter for screening mammogram for malignant neoplasm of breast: Secondary | ICD-10-CM | POA: Diagnosis not present

## 2019-03-11 ENCOUNTER — Encounter: Payer: Self-pay | Admitting: Family Medicine

## 2019-03-11 DIAGNOSIS — R921 Mammographic calcification found on diagnostic imaging of breast: Secondary | ICD-10-CM | POA: Diagnosis not present

## 2019-03-24 ENCOUNTER — Other Ambulatory Visit: Payer: Self-pay | Admitting: Radiology

## 2019-03-24 DIAGNOSIS — R921 Mammographic calcification found on diagnostic imaging of breast: Secondary | ICD-10-CM | POA: Diagnosis not present

## 2019-03-24 DIAGNOSIS — N6012 Diffuse cystic mastopathy of left breast: Secondary | ICD-10-CM | POA: Diagnosis not present

## 2019-04-12 ENCOUNTER — Other Ambulatory Visit: Payer: Self-pay | Admitting: Family Medicine

## 2019-04-13 ENCOUNTER — Encounter: Payer: Self-pay | Admitting: Family Medicine

## 2019-05-14 ENCOUNTER — Other Ambulatory Visit: Payer: Self-pay | Admitting: Family Medicine

## 2019-05-14 DIAGNOSIS — I10 Essential (primary) hypertension: Secondary | ICD-10-CM

## 2019-06-20 ENCOUNTER — Telehealth: Payer: Self-pay | Admitting: Family Medicine

## 2019-06-20 NOTE — Progress Notes (Signed)
  Chronic Care Management   Outreach Note  06/20/2019 Name: Molly Wilcox MRN: [id number] DOB: 10-06-1940  Referred by: Martinique, Betty G, MD Reason for referral : No chief complaint on file.   An unsuccessful telephone outreach was attempted today. The patient was referred to the pharmacist for assistance with care management and care coordination.   Follow Up Plan:   [name] [employer] Scheduler

## 2019-06-24 ENCOUNTER — Telehealth: Payer: Self-pay | Admitting: Family Medicine

## 2019-06-24 DIAGNOSIS — N3941 Urge incontinence: Secondary | ICD-10-CM | POA: Diagnosis not present

## 2019-06-24 DIAGNOSIS — N8189 Other female genital prolapse: Secondary | ICD-10-CM | POA: Diagnosis not present

## 2019-06-24 NOTE — Progress Notes (Signed)
°  Chronic Care Management   Note  06/24/2019 Name: Molly Wilcox MRN: [id number] DOB: December 24, 1940  [name] is a 79 y.o. year old female who is a primary care patient of Martinique, Malka So, MD. I reached out to [name] by phone today in response to a referral sent by [name]'s PCP, Martinique, Betty G, MD.   Ms. Quick was given information about Chronic Care Management services today including:  1. CCM service includes personalized support from designated clinical staff supervised by her physician, including individualized plan of care and coordination with other care providers 2. 24/7 contact phone numbers for assistance for urgent and routine care needs. 3. Service will only be billed when office clinical staff spend 20 minutes or more in a month to coordinate care. 4. Only one practitioner may furnish and bill the service in a calendar month. 5. The patient may stop CCM services at any time (effective at the end of the month) by phone call to the office staff.   Patient did not agree to services and wishes to consider information provided before deciding about enrollment in care management services.   Follow up plan:   [name] [employer] Scheduler

## 2019-07-15 ENCOUNTER — Encounter: Payer: PPO | Admitting: Family Medicine

## 2019-08-04 ENCOUNTER — Other Ambulatory Visit: Payer: Self-pay

## 2019-08-05 ENCOUNTER — Encounter: Payer: Self-pay | Admitting: Family Medicine

## 2019-08-05 ENCOUNTER — Ambulatory Visit (INDEPENDENT_AMBULATORY_CARE_PROVIDER_SITE_OTHER): Payer: PPO | Admitting: Family Medicine

## 2019-08-05 VITALS — BP 130/70 | HR 78 | Temp 97.6°F | Resp 12 | Ht 63.0 in | Wt 127.0 lb

## 2019-08-05 DIAGNOSIS — R7303 Prediabetes: Secondary | ICD-10-CM

## 2019-08-05 DIAGNOSIS — Z0000 Encounter for general adult medical examination without abnormal findings: Secondary | ICD-10-CM

## 2019-08-05 DIAGNOSIS — M816 Localized osteoporosis [Lequesne]: Secondary | ICD-10-CM

## 2019-08-05 DIAGNOSIS — E559 Vitamin D deficiency, unspecified: Secondary | ICD-10-CM

## 2019-08-05 DIAGNOSIS — L989 Disorder of the skin and subcutaneous tissue, unspecified: Secondary | ICD-10-CM | POA: Diagnosis not present

## 2019-08-05 DIAGNOSIS — Z780 Asymptomatic menopausal state: Secondary | ICD-10-CM

## 2019-08-05 DIAGNOSIS — I10 Essential (primary) hypertension: Secondary | ICD-10-CM | POA: Diagnosis not present

## 2019-08-05 LAB — BASIC METABOLIC PANEL
BUN: 26 mg/dL — ABNORMAL HIGH (ref 6–23)
CO2: 31 mEq/L (ref 19–32)
Calcium: 9.9 mg/dL (ref 8.4–10.5)
Chloride: 98 mEq/L (ref 96–112)
Creatinine, Ser: 0.86 mg/dL (ref 0.40–1.20)
GFR: 63.55 mL/min (ref >60.00)
Glucose, Bld: 97 mg/dL (ref 70–99)
Potassium: 3.9 mEq/L (ref 3.5–5.1)
Sodium: 138 mEq/L (ref 135–145)

## 2019-08-05 LAB — HEMOGLOBIN A1C: Hgb A1c MFr Bld: 5.9 % (ref 4.6–6.5)

## 2019-08-05 LAB — VITAMIN D 25 HYDROXY (VIT D DEFICIENCY, FRACTURES): VITD: 46.58 ng/mL (ref 30.00–100.00)

## 2019-08-05 MED ORDER — AMLODIPINE BESYLATE 10 MG PO TABS: 10.0000 mg | ORAL_TABLET | Freq: Every day | ORAL | 3 refills | Status: DC

## 2019-08-05 NOTE — Assessment & Plan Note (Signed)
Continue Fosamax 70 mg weekly, calcium and vitamin D supplementation. Fall prevention. Continue regular physical activity. It seems like she has been taking Fosamax since 2016, we will decide about trying a different medication according to DEXA results.

## 2019-08-05 NOTE — Progress Notes (Signed)
Chief Complaint  Patient presents with  . Annual Exam  . skin lesions    HPI:  Molly Wilcox is a 79 y.o. female, who is here today for her routine physical.  Last CPE: 11/21/2016.  Regular exercise 3 or more time per week: Walking daily 10,000 steps for about year. Following a healthy diet: She has not been consistent. She has gained some wt sine her last visit. She lives alone.  Chronic medical problems: HTN,osteoporosis,multinodular goiter,HLD,vit D deficiency, and IFG among some.  Immunization History  Administered Date(s) Administered  . Fluad Quad(high Dose 65+) 12/25/2018  . Influenza, High Dose Seasonal PF 01/02/2016, 01/29/2017, 01/05/2018  . Influenza-Unspecified 01/29/2017, 01/01/2018, 12/25/2018  . PFIZER SARS-COV-2 Vaccination 05/27/2019, 06/17/2019  . Pneumococcal Conjugate-13 03/30/2014  . Pneumococcal Polysaccharide-23 12/29/2014  . Tdap 09/27/2016  . Zoster Recombinat (Shingrix) 01/28/2019, 04/12/2019   Mammogram: 03/11/19. Colonoscopy: 05/06/13.She would like to have one in a few years. DEXA: 02/27/17. She takes Fosamax 70 mg weekly but she forgets sometimes.  She has been on Fosamax about 4 to 5 years, no side effects.  + Vitamin D deficiency. Last 25 OH vitamin D was done about 3 years ago, 31.  She is on calcium and vitamin D supplementation.  Since her last OV she has seen gyn, started on Oxybutynin.  Medications helped some with urgency. It has not aggravated constipation. She was told surgery was not needed for cystocele.  Her sister was concerned about lesion on nose. She has had lesion for about 6 months and not sure if it is growing. Sister has Hx of skin cancer, she is not sure about type.  HTN: She is on Amlodipine 10 mg daily. She has not noted severe/frequent headache,CP,palpitation, or SOB.  Lab Results  Component Value Date   CREATININE 0.83 01/14/2019   BUN 24 (H) 01/14/2019   NA 138 01/14/2019   K 3.9 01/14/2019    CL 99 01/14/2019   CO2 30 01/14/2019   Negative for polydipsia,polyuria, or polyphagia.  Lab Results  Component Value Date   HGBA1C 6.2 01/14/2019    Review of Systems  Constitutional: Negative for activity change, appetite change, fatigue, fever and unexpected weight change.  HENT: Negative for mouth sores, nosebleeds and sore throat.   Eyes: Negative for redness and visual disturbance.  Respiratory: Negative for cough and wheezing.   Cardiovascular: Negative for leg swelling.  Gastrointestinal: Negative for abdominal pain, nausea and vomiting.       Negative for changes in bowel habits.  Endocrine: Negative for cold intolerance and heat intolerance.  Genitourinary: Negative for decreased urine volume, dysuria and hematuria.  Musculoskeletal: Positive for arthralgias. Negative for gait problem.  Skin: Negative for rash and wound.  Allergic/Immunologic: Negative for environmental allergies.  Neurological: Negative for seizures, syncope, weakness, numbness and headaches.  Psychiatric/Behavioral: Negative for confusion. The patient is not nervous/anxious.   All other systems reviewed and are negative.  Current Outpatient Medications on File Prior to Visit  Medication Sig Dispense Refill  . alendronate (FOSAMAX) 70 MG tablet TAKE 1 TABLET BY MOUTH ONE TIME PER WEEK 12 tablet 3  . aspirin EC 81 MG tablet Take 81 mg by mouth daily.    . Ca Phosphate-Cholecalciferol (CALCIUM/VITAMIN D3 GUMMIES PO) Take 2 gummies by mouth daily.    [name] Kitchen docusate sodium (COLACE) 100 MG capsule Take 100 mg by mouth as needed for mild constipation.    [name] Kitchen MAGNESIUM OXIDE PO Take 250 mg by mouth daily.     [name] Kitchen  oxybutynin (DITROPAN-XL) 5 MG 24 hr tablet Take 5 mg by mouth every morning.    . simvastatin (ZOCOR) 10 MG tablet TAKE 1 TABLET BY MOUTH EVERYDAY AT BEDTIME 90 tablet 2  . triamterene-hydrochlorothiazide (MAXZIDE-25) 37.5-25 MG tablet TAKE 1 TABLET BY MOUTH EVERY DAY 90 tablet 2  . Wheat Dextrin (BENEFIBER  PO) Take by mouth.     No current facility-administered medications on file prior to visit.   Past Medical History:  Diagnosis Date  . High cholesterol   . Hypertension   . Osteoarthritis   . Raynaud disease    Past Surgical History:  Procedure Laterality Date  . TUBAL LIGATION      Allergies  Allergen Reactions  . Prednisone Other (See Comments)    Dehydration, amnesia   . Ibuprofen Nausea Only and Other (See Comments)    Nausea & headaches    Family History  Problem Relation Age of Onset  . Heart disease Father   . Thyroid disease Neg Hx     Social History   Socioeconomic History  . Marital status: Widowed    Spouse name: Not on file  . Number of children: Not on file  . Years of education: Not on file  . Highest education level: Not on file  Occupational History  . Not on file  Tobacco Use  . Smoking status: Never Smoker  . Smokeless tobacco: Never Used  Substance and Sexual Activity  . Alcohol use: Yes  . Drug use: No  . Sexual activity: Not Currently  Other Topics Concern  . Not on file  Social History Narrative  . Not on file   Social Determinants of Health   Financial Resource Strain:   . Difficulty of Paying Living Expenses:   Food Insecurity:   . Worried About Charity fundraiser in the Last Year:   . Arboriculturist in the Last Year:   Transportation Needs:   . Film/video editor (Medical):   [name] Kitchen Lack of Transportation (Non-Medical):   Physical Activity:   . Days of Exercise per Week:   . Minutes of Exercise per Session:   Stress:   . Feeling of Stress :   Social Connections:   . Frequency of Communication with Friends and Family:   . Frequency of Social Gatherings with Friends and Family:   . Attends Religious Services:   . Active Member of Clubs or Organizations:   . Attends Archivist Meetings:   [name] Kitchen Marital Status:     Vitals:   08/05/19 0829  BP: 130/70  Pulse: 78  Resp: 12  Temp: 97.6 F (36.4 C)  SpO2: 98%    Body mass index is 22.5 kg/m.  Wt Readings from Last 3 Encounters:  08/05/19 127 lb (57.6 kg)  01/14/19 138 lb 2 oz (62.7 kg)  09/05/18 138 lb 12.8 oz (63 kg)    Physical Exam  Nursing note and vitals reviewed. Constitutional: She is oriented to person, place, and time. She appears well-developed and well-nourished. No distress.  HENT:  Head: Normocephalic and atraumatic.  Mouth/Throat: Oropharynx is clear and moist and mucous membranes are normal.  Eyes: Pupils are equal, round, and reactive to light. Conjunctivae are normal.  Cardiovascular: Normal rate and regular rhythm.  Murmur (? soft SEM RUSB) heard. Pulses:      Dorsalis pedis pulses are 2+ on the right side and 2+ on the left side.  Respiratory: Effort normal and breath sounds normal. No respiratory distress.  GI: Soft. She exhibits no mass. There is no hepatomegaly. There is no abdominal tenderness.  Genitourinary:    Genitourinary Comments: Breast: No masses,skin changes,or nipple discharge. Fibrocystic like changed outer upper quadrants,bilateral.   Musculoskeletal:        General: Edema (1+ pitting LE edema, bilateral) present.  Lymphadenopathy:    She has no cervical adenopathy.    She has no axillary adenopathy.  Neurological: She is alert and oriented to person, place, and time. She has normal strength. No cranial nerve deficit.  Stable gait , not assisted.  Skin: Skin is warm. No rash noted. No erythema.  No suspicious lesions. Cherry moles and SK on back and abdomen. On nose a 2 mm white crust. No erythema.  Psychiatric: She has a normal mood and affect.  Well groomed, good eye contact.    ASSESSMENT AND PLAN:  Molly Wilcox was here today annual physical examination.  Orders Placed This Encounter  Procedures  . DG Bone Density  . Basic metabolic panel  . Hemoglobin A1c  . VITAMIN D 25 Hydroxy (Vit-D Deficiency, Fractures)   Lab Results  Component Value Date   CREATININE 0.86  08/05/2019   BUN 26 (H) 08/05/2019   NA 138 08/05/2019   K 3.9 08/05/2019   CL 98 08/05/2019   CO2 31 08/05/2019   Lab Results  Component Value Date   HGBA1C 5.9 08/05/2019    Routine general medical examination at a health care facility  We discussed the importance of regular physical activity and healthy diet for prevention of chronic illness and/or complications. Preventive guidelines reviewed. Vaccination up to date.  Ca++ and vit D supplementation to continue. Next CPE in a year.  Asymptomatic postmenopausal estrogen deficiency -     DG Bone Density; Future  Essential hypertension BP adequately controlled. Continue amlodipine 10 mg daily and Maxide 37.5-25 mg daily. Continue low-salt diet.   Vitamin D deficiency Continue current dose of vitamin D supplementation. Further recommendation will be given according to lab results.  Osteoporosis Continue Fosamax 70 mg weekly, calcium and vitamin D supplementation. Fall prevention. Continue regular physical activity. It seems like she has been taking Fosamax since 2016, we will decide about trying a different medication according to DEXA results.  Prediabetes Continue healthy lifestyle for primary prevention. Further recommendation will be given according to A1c results.  Skin lesion of face Lesion doe snot seem suspicious for serious process. Recommend monitoring lesion on nose for changes.  Return in 6 months (on 02/05/2020) for AWV and f/u.    Mckinley Adelstein G. Martinique, MD  [facility]. [place] office.  A few things to remember from today's visit:  A few tips:  -As we age balance is not as good as it was, so there is a higher risks for falls. Please remove small rugs and furniture that is "in your way" and could increase the risk of falls. Stretching exercises may help with fall prevention: Yoga and Tai Chi are some examples. Low impact exercise is better, so you are not very achy the next day.  -Sun  screen and avoidance of direct sun light recommended. Caution with dehydration, if working outdoors be sure to drink enough fluids.  - Some medications are not safe as we age, increases the risk of side effects and can potentially interact with other medication you are also taken;  including some of over the counter medications. Be sure to let me know when you start a new medication even if it  is a dietary/vitamin supplement.   -Healthy diet low in red meet/animal fat and sugar + regular physical activity is recommended.     If you need refills please call your pharmacy. Do not use My Chart to request refills or for acute issues that need immediate attention.    Please be sure medication list is accurate. If a new problem present, please set up appointment sooner than planned today.

## 2019-08-05 NOTE — Patient Instructions (Addendum)
A few things to remember from today's visit:  A few tips:  -As we age balance is not as good as it was, so there is a higher risks for falls. Please remove small rugs and furniture that is "in your way" and could increase the risk of falls. Stretching exercises may help with fall prevention: Yoga and Tai Chi are some examples. Low impact exercise is better, so you are not very achy the next day.  -Sun screen and avoidance of direct sun light recommended. Caution with dehydration, if working outdoors be sure to drink enough fluids.  - Some medications are not safe as we age, increases the risk of side effects and can potentially interact with other medication you are also taken;  including some of over the counter medications. Be sure to let me know when you start a new medication even if it is a dietary/vitamin supplement.   -Healthy diet low in red meet/animal fat and sugar + regular physical activity is recommended.     If you need refills please call your pharmacy. Do not use My Chart to request refills or for acute issues that need immediate attention.    Please be sure medication list is accurate. If a new problem present, please set up appointment sooner than planned today.

## 2019-08-05 NOTE — Assessment & Plan Note (Signed)
BP adequately controlled. Continue amlodipine 10 mg daily and Maxide 37.5-25 mg daily. Continue low-salt diet.

## 2019-08-05 NOTE — Assessment & Plan Note (Signed)
Continue current dose of vitamin D supplementation. Further recommendation will be given according to lab results.

## 2019-08-05 NOTE — Assessment & Plan Note (Signed)
Continue healthy lifestyle for primary prevention. Further recommendation will be given according to A1c results.

## 2019-08-29 ENCOUNTER — Other Ambulatory Visit: Payer: Self-pay

## 2019-09-03 ENCOUNTER — Ambulatory Visit: Payer: PPO | Admitting: Endocrinology

## 2019-09-03 ENCOUNTER — Encounter: Payer: Self-pay | Admitting: Endocrinology

## 2019-09-03 ENCOUNTER — Other Ambulatory Visit: Payer: Self-pay

## 2019-09-03 ENCOUNTER — Telehealth: Payer: Self-pay | Admitting: Endocrinology

## 2019-09-03 VITALS — BP 142/74 | HR 88 | Ht 63.0 in | Wt 126.0 lb

## 2019-09-03 DIAGNOSIS — E042 Nontoxic multinodular goiter: Secondary | ICD-10-CM | POA: Diagnosis not present

## 2019-09-03 LAB — T4, FREE: Free T4: 0.88 ng/dL (ref 0.60–1.60)

## 2019-09-03 LAB — TSH: TSH: 1.88 u[IU]/mL (ref 0.35–4.50)

## 2019-09-03 NOTE — Telephone Encounter (Signed)
Patient called to let Dr. Loanne Drilling know that [facility] does not do walk-in appointments.

## 2019-09-03 NOTE — Patient Instructions (Signed)
Your blood pressure is high today.  Please see your primary care provider soon, to have it rechecked Blood tests are requested for you today.  We'll let you know about the results.  Let's recheck the ultrasound.  you will receive a phone call, about a day and time for an appointment. Please come back for a follow-up appointment in 1 year.

## 2019-09-03 NOTE — Progress Notes (Signed)
Subjective:    Patient ID: Molly Wilcox, female    DOB: 01/12/1941, 79 y.o.   MRN: [id number]  HPI Pt returns for f/u of multinodular goiter (dx'ed 2018; bx of LLP nodule in 2018 and 2020 both showed BENIGN FOLLICULAR NODULE (BETHESDA CATEGORY II); she has been euthyroid off rx; [place] in 2019 was unchanged).  pt states she feels well in general.  She does not notice the goiter.   Past Medical History:  Diagnosis Date  . High cholesterol   . Hypertension   . Osteoarthritis   . Raynaud disease     Past Surgical History:  Procedure Laterality Date  . TUBAL LIGATION      Social History   Socioeconomic History  . Marital status: Widowed    Spouse name: Not on file  . Number of children: Not on file  . Years of education: Not on file  . Highest education level: Not on file  Occupational History  . Not on file  Tobacco Use  . Smoking status: Never Smoker  . Smokeless tobacco: Never Used  Substance and Sexual Activity  . Alcohol use: Yes  . Drug use: No  . Sexual activity: Not Currently  Other Topics Concern  . Not on file  Social History Narrative  . Not on file   Social Determinants of Health   Financial Resource Strain:   . Difficulty of Paying Living Expenses:   Food Insecurity:   . Worried About Charity fundraiser in the Last Year:   . Arboriculturist in the Last Year:   Transportation Needs:   . Film/video editor (Medical):   [name] Kitchen Lack of Transportation (Non-Medical):   Physical Activity:   . Days of Exercise per Week:   . Minutes of Exercise per Session:   Stress:   . Feeling of Stress :   Social Connections:   . Frequency of Communication with Friends and Family:   . Frequency of Social Gatherings with Friends and Family:   . Attends Religious Services:   . Active Member of Clubs or Organizations:   . Attends Archivist Meetings:   [name] Kitchen Marital Status:   Intimate Partner Violence:   . Fear of Current or Ex-Partner:   . Emotionally  Abused:   [name] Kitchen Physically Abused:   . Sexually Abused:     Current Outpatient Medications on File Prior to Visit  Medication Sig Dispense Refill  . alendronate (FOSAMAX) 70 MG tablet TAKE 1 TABLET BY MOUTH ONE TIME PER WEEK 12 tablet 3  . amLODipine (NORVASC) 10 MG tablet Take 1 tablet (10 mg total) by mouth daily. 90 tablet 3  . aspirin EC 81 MG tablet Take 81 mg by mouth daily.    . Ca Phosphate-Cholecalciferol (CALCIUM/VITAMIN D3 GUMMIES PO) Take 2 gummies by mouth daily.    [name] Kitchen docusate sodium (COLACE) 100 MG capsule Take 100 mg by mouth as needed for mild constipation.    [name] Kitchen MAGNESIUM OXIDE PO Take 250 mg by mouth daily.     [name] Kitchen oxybutynin (DITROPAN-XL) 5 MG 24 hr tablet Take 5 mg by mouth every morning.    . simvastatin (ZOCOR) 10 MG tablet TAKE 1 TABLET BY MOUTH EVERYDAY AT BEDTIME 90 tablet 2  . triamterene-hydrochlorothiazide (MAXZIDE-25) 37.5-25 MG tablet TAKE 1 TABLET BY MOUTH EVERY DAY 90 tablet 2  . Wheat Dextrin (BENEFIBER PO) Take by mouth.     No current facility-administered medications on file prior to visit.  Allergies  Allergen Reactions  . Prednisone Other (See Comments)    Dehydration, amnesia   . Ibuprofen Nausea Only and Other (See Comments)    Nausea & headaches    Family History  Problem Relation Age of Onset  . Heart disease Father   . Thyroid disease Neg Hx     BP (!) 142/74   Pulse 88   Ht 5\' 3"  (1.6 m)   Wt 126 lb (57.2 kg)   SpO2 99%   BMI 22.32 kg/m    Review of Systems Denies sob.      Objective:   Physical Exam VITAL SIGNS:  See vs page GENERAL: no distress NECK: thyroid is slightly enlarged, with irreg surface, but no palpable nodule.   Lab Results  Component Value Date   CREATININE 0.86 08/05/2019   BUN 26 (H) 08/05/2019   NA 138 08/05/2019   K 3.9 08/05/2019   CL 98 08/05/2019   CO2 31 08/05/2019       Assessment & Plan:  HTN: is noted today.  MNG: due for recheck.  Patient Instructions  Your blood pressure is high  today.  Please see your primary care provider soon, to have it rechecked Blood tests are requested for you today.  We'll let you know about the results.  Let's recheck the ultrasound.  you will receive a phone call, about a day and time for an appointment. Please come back for a follow-up appointment in 1 year.

## 2019-09-08 ENCOUNTER — Ambulatory Visit: Payer: PPO | Admitting: Endocrinology

## 2019-09-23 ENCOUNTER — Ambulatory Visit
Admission: RE | Admit: 2019-09-23 | Discharge: 2019-09-23 | Disposition: A | Discharge status: Home / Self Care | Payer: PPO | Source: Ambulatory Visit | Attending: Endocrinology | Admitting: Endocrinology

## 2019-09-23 DIAGNOSIS — E041 Nontoxic single thyroid nodule: Secondary | ICD-10-CM | POA: Diagnosis not present

## 2019-09-23 DIAGNOSIS — E042 Nontoxic multinodular goiter: Secondary | ICD-10-CM

## 2019-10-28 ENCOUNTER — Other Ambulatory Visit: Payer: Self-pay

## 2019-10-31 ENCOUNTER — Ambulatory Visit: Payer: PPO | Admitting: Podiatry

## 2019-10-31 ENCOUNTER — Other Ambulatory Visit: Payer: Self-pay

## 2019-10-31 DIAGNOSIS — M79675 Pain in left toe(s): Secondary | ICD-10-CM | POA: Diagnosis not present

## 2019-10-31 DIAGNOSIS — M79674 Pain in right toe(s): Secondary | ICD-10-CM

## 2019-10-31 DIAGNOSIS — L603 Nail dystrophy: Secondary | ICD-10-CM | POA: Diagnosis not present

## 2019-10-31 NOTE — Patient Instructions (Signed)

## 2019-11-02 IMAGING — US US THYROID
1 series · 12 of 25 positions shown · non-contrast
Comparison: 09/29/2016

CLINICAL DATA: 76-year-old female with a history of multinodular
thyroid

EXAM:
THYROID ULTRASOUND
TECHNIQUE: Ultrasound examination of the thyroid gland and adjacent soft
tissues was performed.

[Series 1: us thyroid · 0.04mm/px · 12 of 47 slices shown]
[im 2/47]
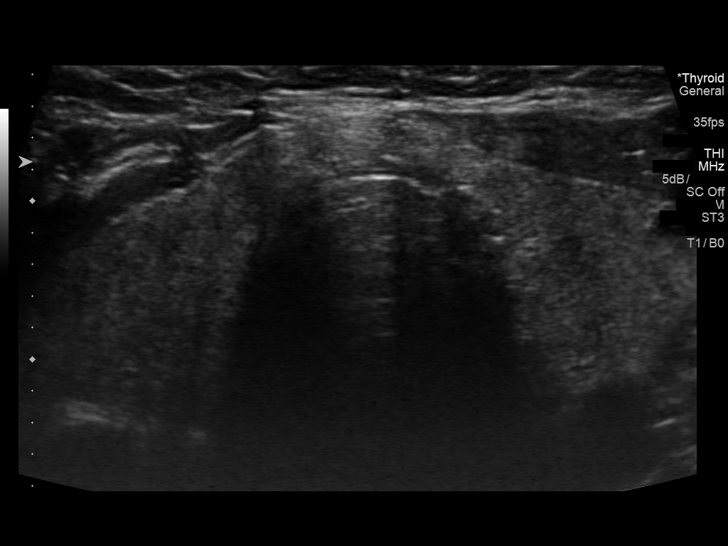
[im 6/47]
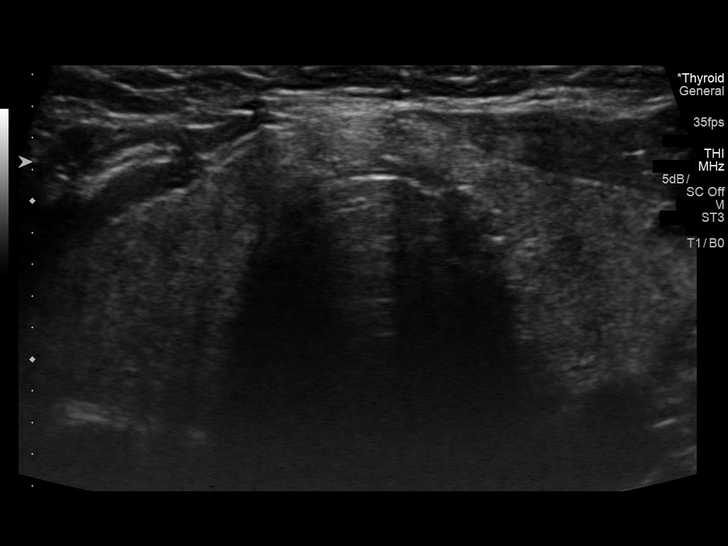
[im 10/47]
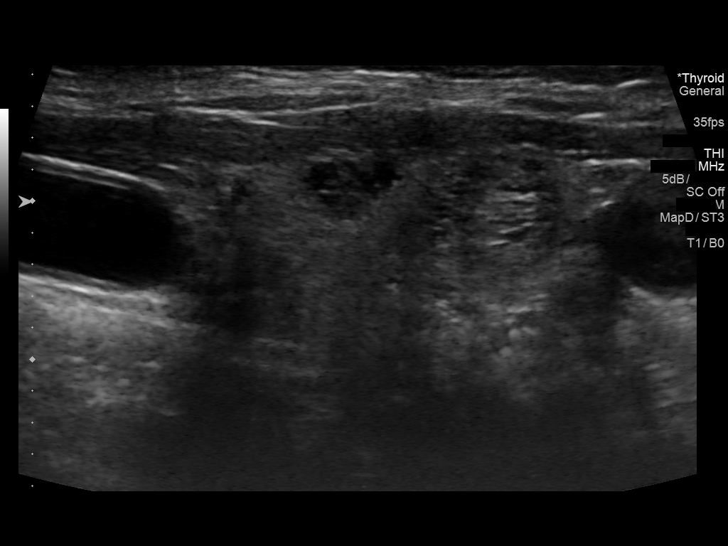
[im 14/47]
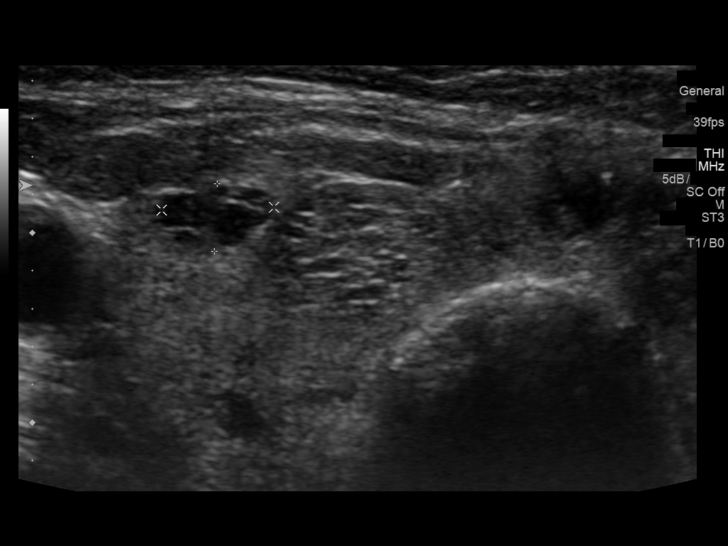
[im 18/47]
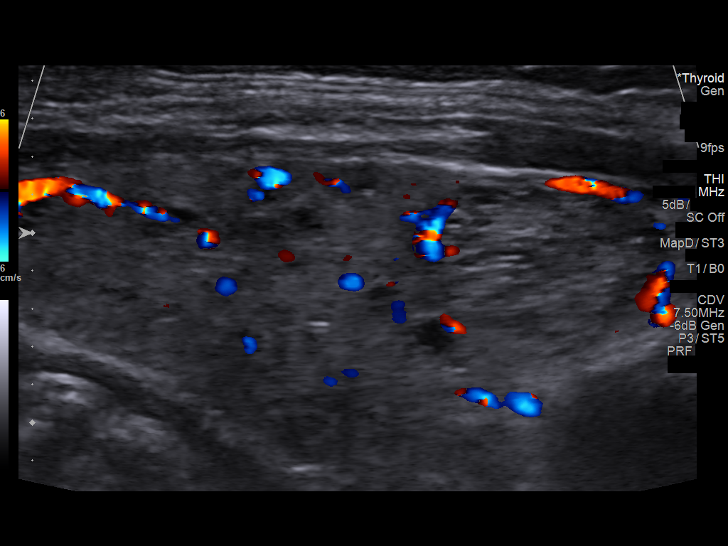
[im 22/47]
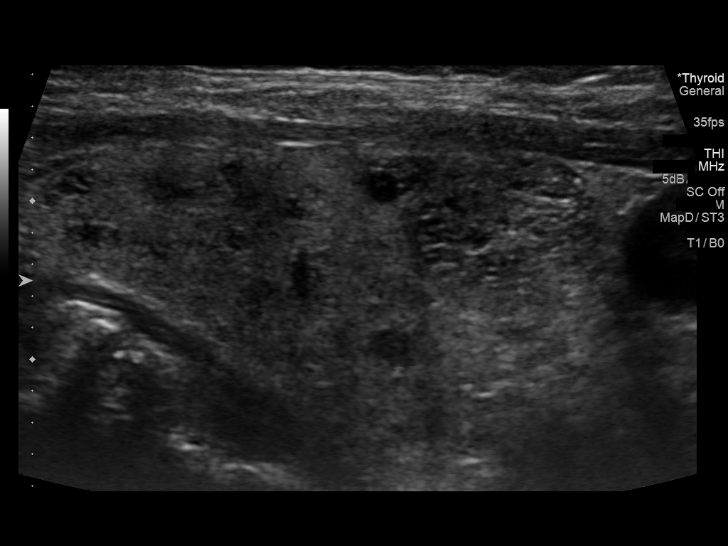
[im 25/47]
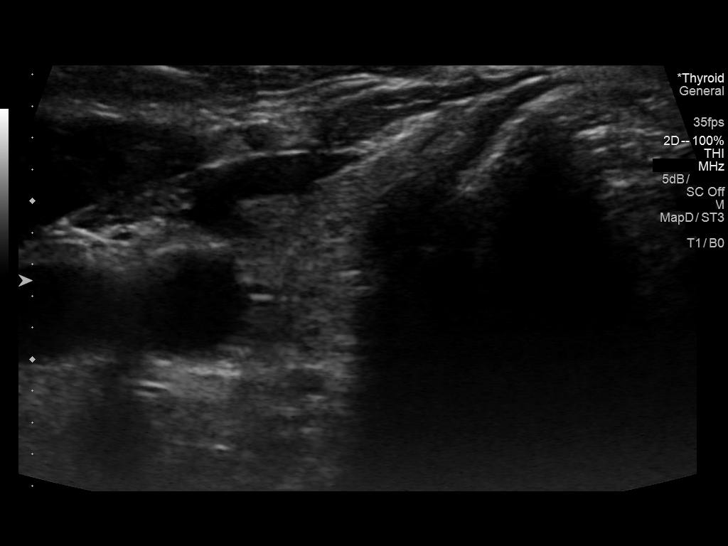
[im 29/47]
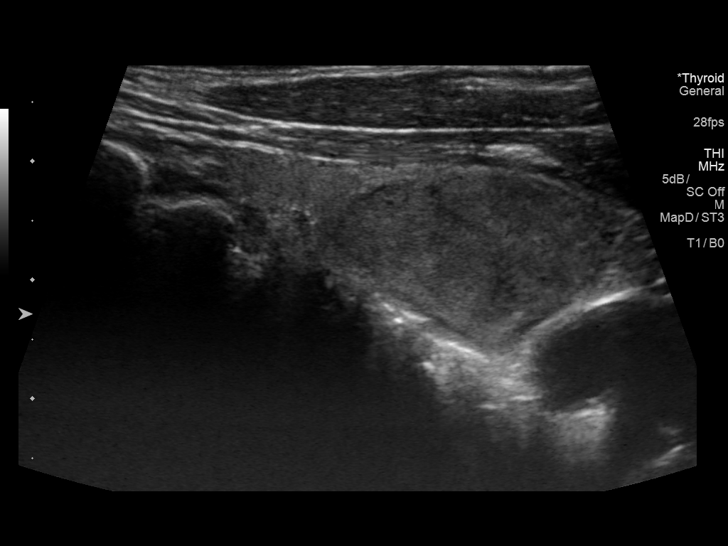
[im 33/47]
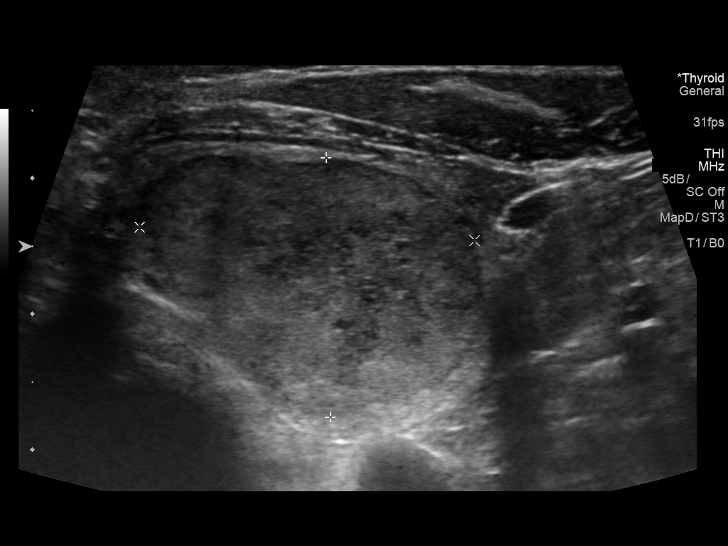
[im 37/47]
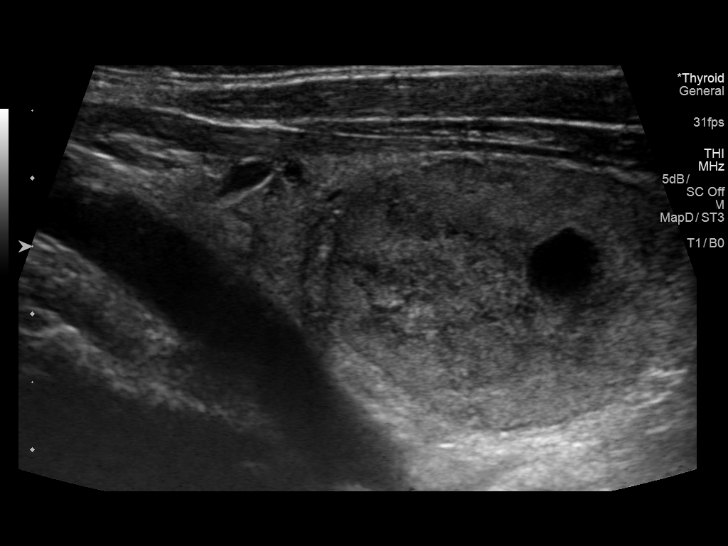
[im 41/47]
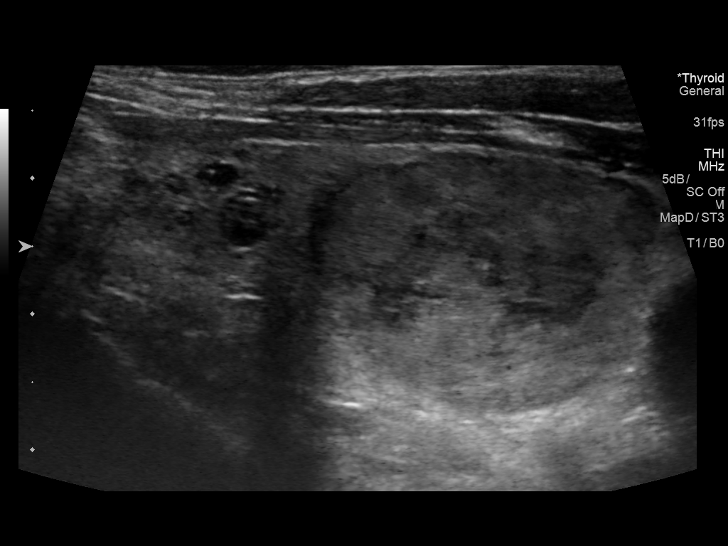
[im 45/47]
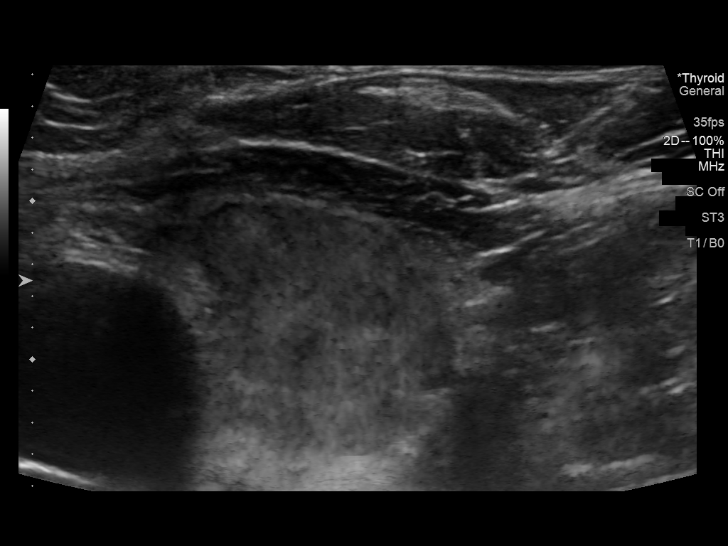

[12 of 25 positions shown; findings below may reference images not displayed]

FINDINGS: Parenchymal Echotexture: Mildly heterogenous

Isthmus: 0.4 cm

Right lobe: 3.8 cm x 1.7 cm x 1.7 cm

Left lobe: 4.9 cm x 1.8 cm x 1.8 cm

_________________________________________________________

Estimated total number of nodules >/= 1 cm: 3

Number of spongiform nodules >/=  2 cm not described below (TR1): 0

Number of mixed cystic and solid nodules >/= 1.5 cm not described
below (TR2): 0

_________________________________________________________

Nodule # 1:

Location: Isthmus; Mid

Maximum size: 1.2 cm; Other 2 dimensions: 0.6 cm x 1.1 cm

Composition: solid/almost completely solid (2)

Echogenicity: hypoechoic (2)

Shape: not taller-than-wide (0)

Margins: ill-defined (0)

Echogenic foci: none (0)

ACR TI-RADS total points: 4.

ACR TI-RADS risk category: TR5 (>/= 7 points).

ACR TI-RADS recommendations:

Nodule meets criteria for surveillance

_________________________________________________________

Nodule # 2:

Location: Right; Superior

Maximum size: 0.8 cm; Other 2 dimensions: 0.3 cm x 0.6 cm

Composition: cannot determine (2)

Echogenicity: hypoechoic (2)

Shape: not taller-than-wide (0)

Margins: ill-defined (0)

Echogenic foci: none (0)

ACR TI-RADS total points: 4.

ACR TI-RADS risk category: TR4 (4-6 points).

ACR TI-RADS recommendations:

Nodule does not meet criteria for surveillance or biopsy

_________________________________________________________

Nodule # 3:

Location: Right; Mid

Maximum size: 0.7 cm; Other 2 dimensions: 0.4 cm x 0.6 cm

Composition: spongiform (0)

ACR TI-RADS recommendations:

Spongiform nodule does not meet criteria for surveillance or biopsy

_________________________________________________________

Nodule # 4:

Location: Right; Inferior

Maximum size: 1.3 cm; Other 2 dimensions: 1.0 cm x 1.2 cm

Composition: spongiform (0)

ACR TI-RADS recommendations:

Spongiform nodule does not meet criteria for surveillance or biopsy

_________________________________________________________

Nodule # 5:

Location: Left; Inferior

Maximum size: 2.6 cm; Other 2 dimensions: 1.9 cm x 2.5 cm (nodule
has decreased from 3 cm)

Composition: solid/almost completely solid (2)

Echogenicity: hypoechoic (2)

Shape: not taller-than-wide (0)

Margins: ill-defined (0)

Echogenic foci: none (0)

ACR TI-RADS total points: 4.

ACR TI-RADS risk category: TR4 (4-6 points).

ACR TI-RADS recommendations:

Nodule meets criteria for biopsy

_________________________________________________________

No adenopathy
IMPRESSION: Left inferior thyroid nodule (labeled 5) has decreased size since
the comparison study. Surveillance is reasonable, as the decreasing
size suggest benignity.

Isthmic thyroid nodule (labeled 1) meets criteria for surveillance,
as designated by the newly established ACR TI-RADS criteria.
Surveillance ultrasound study recommended to be performed annually
up to 5 years.

No thyroid nodule meets criteria for biopsy or surveillance, as
designated by the newly established ACR TI-RADS criteria.

Recommendations follow those established by the new ACR TI-RADS
criteria ([HOSPITAL] 6878;[DATE]).

## 2019-11-04 NOTE — Progress Notes (Signed)
Subjective:   Patient ID: Molly Wilcox, female   DOB: 79 y.o.   MRN: [id number]   HPI 79 year old female presents the office today for concerns of very thick, elongated and discolored toenails to her big toes which are causing discomfort with pressure.  Denies any redness or drainage.  She states they are very thick and she not able to trim them.  They are occurring shortly hit the second toe.  She has no other concerns today.   Review of Systems  All other systems reviewed and are negative.  Past Medical History:  Diagnosis Date  . High cholesterol   . Hypertension   . Osteoarthritis   . Raynaud disease     Past Surgical History:  Procedure Laterality Date  . TUBAL LIGATION       Current Outpatient Medications:  .  alendronate (FOSAMAX) 70 MG tablet, TAKE 1 TABLET BY MOUTH ONE TIME PER WEEK, Disp: 12 tablet, Rfl: 3 .  amLODipine (NORVASC) 10 MG tablet, Take 1 tablet (10 mg total) by mouth daily., Disp: 90 tablet, Rfl: 3 .  aspirin EC 81 MG tablet, Take 81 mg by mouth daily., Disp: , Rfl:  .  Ca Phosphate-Cholecalciferol (CALCIUM/VITAMIN D3 GUMMIES PO), Take 2 gummies by mouth daily., Disp: , Rfl:  .  docusate sodium (COLACE) 100 MG capsule, Take 100 mg by mouth as needed for mild constipation., Disp: , Rfl:  .  MAGNESIUM OXIDE PO, Take 250 mg by mouth daily. , Disp: , Rfl:  .  oxybutynin (DITROPAN-XL) 5 MG 24 hr tablet, Take 5 mg by mouth every morning., Disp: , Rfl:  .  simvastatin (ZOCOR) 10 MG tablet, TAKE 1 TABLET BY MOUTH EVERYDAY AT BEDTIME, Disp: 90 tablet, Rfl: 2 .  triamterene-hydrochlorothiazide (MAXZIDE-25) 37.5-25 MG tablet, TAKE 1 TABLET BY MOUTH EVERY DAY, Disp: 90 tablet, Rfl: 2 .  Wheat Dextrin (BENEFIBER PO), Take by mouth., Disp: , Rfl:   Allergies  Allergen Reactions  . Prednisone Other (See Comments)    Dehydration, amnesia   . Ibuprofen Nausea Only and Other (See Comments)    Nausea & headaches        Objective:  Physical Exam  General:  AAO x3, NAD  Dermatological: Bilateral hallux nails are significantly hypertrophic, dystrophic with brown discoloration and occurring putting pressure on the second toes.  There are no open lesions today.  Vascular: Dorsalis Pedis artery and Posterior Tibial artery pedal pulses are 2/4 bilateral with immedate capillary fill time. There is no pain with calf compression, swelling, warmth, erythema.   Neruologic: Grossly intact via light touch bilateral.   Musculoskeletal: Tenderness of the hallux toenails no other areas of discomfort identified.  Muscular strength 5/5 in all groups tested bilateral.     Assessment:   79 year old female bilateral symptomatic severe onychodystrophy     Plan:  -Treatment options discussed including all alternatives, risks, and complications -Etiology of symptoms were discussed -At this time, after discussion the patient is requesting total nail removal with chemical matricectomy. Risks and complications were discussed with the patient for which they understand and written consent was obtained. Under sterile conditions a total of 3 mL of a mixture of 2% lidocaine plain and 0.5% Marcaine plain was infiltrated in a hallux block fashion. Once anesthetized, the skin was prepped in sterile fashion. A tourniquet was then applied. Next the left and right hallux toenails were then sharply excised making sure to remove the entire offending nail borders. Once the nails were ensured to be  removed area was debrided and the underlying skin was intact. There is no purulence identified in the procedure. Next phenol was then applied under standard conditions and copiously irrigated. Silvadene was applied. A dry sterile dressing was applied. After application of the dressing the tourniquet was removed and there is found to be an immediate capillary refill time to the digit. The patient tolerated the procedure well any complications. Post procedure instructions were discussed the patient  for which he verbally understood. Follow-up in one week for nail check or sooner if any problems are to arise. Discussed signs/symptoms of infection and directed to call the office immediately should any occur or go directly to the emergency room. In the meantime, encouraged to call the office with any questions, concerns, changes symptoms.     Trula Slade DPM

## 2019-11-17 ENCOUNTER — Other Ambulatory Visit: Payer: Self-pay | Admitting: Family Medicine

## 2019-11-17 DIAGNOSIS — E7800 Pure hypercholesterolemia, unspecified: Secondary | ICD-10-CM

## 2019-11-18 ENCOUNTER — Other Ambulatory Visit: Payer: Self-pay

## 2019-11-18 ENCOUNTER — Ambulatory Visit (INDEPENDENT_AMBULATORY_CARE_PROVIDER_SITE_OTHER): Payer: PPO | Admitting: Podiatry

## 2019-11-18 ENCOUNTER — Encounter: Payer: Self-pay | Admitting: Podiatry

## 2019-11-18 DIAGNOSIS — M79674 Pain in right toe(s): Secondary | ICD-10-CM

## 2019-11-18 DIAGNOSIS — L603 Nail dystrophy: Secondary | ICD-10-CM

## 2019-11-18 DIAGNOSIS — M79675 Pain in left toe(s): Secondary | ICD-10-CM

## 2019-11-18 NOTE — Patient Instructions (Signed)

## 2019-11-25 NOTE — Progress Notes (Signed)
Subjective: Martrice Alsha Meland is a 79 y.o.  female returns to office today for follow up evaluation after having bilateral hallux total nail avulsion performed. Patient has been soaking using epsom salts and applying topical antibiotic covered with bandaid daily.  She is having she does not do soaking more and is doing better.  She states that she is doing great.  Patient denies fevers, chills, nausea, vomiting. Denies any calf pain, chest pain, SOB.   Objective:  General: Well developed, nourished, in no acute distress, alert and oriented x3   Dermatology: Skin is warm, dry and supple bilateral. Bilateral hallux nail border appears to be clean, dry, with mild granular tissue and surrounding scab. There is no surrounding erythema, edema, drainage/purulence. The remaining nails appear unremarkable at this time. There are no other lesions or other signs of infection present.  Neurovascular status: Intact. No lower extremity swelling; No pain with calf compression bilateral.  Musculoskeletal: No tenderness to palpation of the hallux nail beds. Muscular strength within normal limits bilateral.   Assesement and Plan: S/p partial nail avulsion, doing well.   -Continue soaking in epsom salts twice a day followed by antibiotic ointment and a band-aid. Can leave uncovered at night. Continue this until completely healed.  -If the area has not healed in 2 weeks, call the office for follow-up appointment, or sooner if any problems arise.  -Monitor for any signs/symptoms of infection. Call the office immediately if any occur or go directly to the emergency room. Call with any questions/concerns.  Celesta Gentile, DPM

## 2019-12-01 ENCOUNTER — Other Ambulatory Visit: Payer: Self-pay | Admitting: Family Medicine

## 2019-12-01 DIAGNOSIS — I10 Essential (primary) hypertension: Secondary | ICD-10-CM

## 2020-01-05 ENCOUNTER — Encounter: Payer: Self-pay | Admitting: Family Medicine

## 2020-01-08 ENCOUNTER — Encounter: Payer: Self-pay | Admitting: Adult Health

## 2020-01-08 ENCOUNTER — Telehealth (INDEPENDENT_AMBULATORY_CARE_PROVIDER_SITE_OTHER): Payer: PPO | Admitting: Adult Health

## 2020-01-08 DIAGNOSIS — L03113 Cellulitis of right upper limb: Secondary | ICD-10-CM | POA: Diagnosis not present

## 2020-01-08 MED ORDER — DOXYCYCLINE HYCLATE 100 MG PO CAPS: 100.0000 mg | ORAL_CAPSULE | Freq: Two times a day (BID) | ORAL | 0 refills | Status: DC

## 2020-01-08 NOTE — Progress Notes (Signed)
Virtual Visit via Video Note  I connected with Molly Wilcox on 01/08/20 at  3:00 PM EDT by a video enabled telemedicine application and verified that I am speaking with the correct person using two identifiers.  Location patient: home Location provider:work or home office Persons participating in the virtual visit: patient, provider  I discussed the limitations of evaluation and management by telemedicine and the availability of in person appointments. The patient expressed understanding and agreed to proceed.   HPI: 79 year old female who is being evaluated today for concern of cellulitis to the right upper arm. She reports that she first noticed this yesterday and believes that she may have been bit by an insect. Since that time she has had a red, warm, edematous area on her right upper arm that seems to be spreading over the last 24 hours. He denies fevers, chills, or streaking. She has been using Benadryl at home without symptom relief.   ROS: See pertinent positives and negatives per HPI.  Past Medical History:  Diagnosis Date   High cholesterol    Hypertension    Osteoarthritis    Raynaud disease     Past Surgical History:  Procedure Laterality Date   TUBAL LIGATION      Family History  Problem Relation Age of Onset   Heart disease Father    Thyroid disease Neg Hx        Current Outpatient Medications:    alendronate (FOSAMAX) 70 MG tablet, TAKE 1 TABLET BY MOUTH ONE TIME PER WEEK, Disp: 12 tablet, Rfl: 3   amLODipine (NORVASC) 10 MG tablet, TAKE 1 TABLET BY MOUTH EVERY DAY, Disp: 90 tablet, Rfl: 2   aspirin EC 81 MG tablet, Take 81 mg by mouth daily., Disp: , Rfl:    Ca Phosphate-Cholecalciferol (CALCIUM/VITAMIN D3 GUMMIES PO), Take 2 gummies by mouth daily., Disp: , Rfl:    docusate sodium (COLACE) 100 MG capsule, Take 100 mg by mouth as needed for mild constipation., Disp: , Rfl:    MAGNESIUM OXIDE PO, Take 250 mg by mouth daily. , Disp: , Rfl:     oxybutynin (DITROPAN-XL) 5 MG 24 hr tablet, Take 5 mg by mouth every morning., Disp: , Rfl:    simvastatin (ZOCOR) 10 MG tablet, TAKE 1 TABLET BY MOUTH EVERYDAY AT BEDTIME, Disp: 90 tablet, Rfl: 2   triamterene-hydrochlorothiazide (MAXZIDE-25) 37.5-25 MG tablet, TAKE 1 TABLET BY MOUTH EVERY DAY, Disp: 90 tablet, Rfl: 2   Wheat Dextrin (BENEFIBER PO), Take by mouth., Disp: , Rfl:   EXAM:  VITALS per patient if applicable:  GENERAL: alert, oriented, appears well and in no acute distress  HEENT: atraumatic, conjunttiva clear, no obvious abnormalities on inspection of external nose and ears  NECK: normal movements of the head and neck  LUNGS: on inspection no signs of respiratory distress, breathing rate appears normal, no obvious gross SOB, gasping or wheezing  CV: no obvious cyanosis  MS: moves all visible extremities without noticeable abnormality  PSYCH/NEURO: pleasant and cooperative, no obvious depression or anxiety, speech and thought processing grossly intact  SKIN: Red area noted on right upper arm. No streaking seen  ASSESSMENT AND PLAN:  Discussed the following assessment and plan:  1. Cellulitis of right upper arm -This may possibly be a localized reaction to an insect bite, but not 100% sure and there is concern for cellulitis. Will treat with doxycycline twice daily x10 days. She can apply topical cortisone cream as well. Follow-up if not resolving over the next 2 to  3 days - doxycycline (VIBRAMYCIN) 100 MG capsule; Take 1 capsule (100 mg total) by mouth 2 (two) times daily.  Dispense: 20 capsule; Refill: 0     I discussed the assessment and treatment plan with the patient. The patient was provided an opportunity to ask questions and all were answered. The patient agreed with the plan and demonstrated an understanding of the instructions.   The patient was advised to call back or seek an in-person evaluation if the symptoms worsen or if the condition fails to  improve as anticipated.   Dorothyann Peng, NP

## 2020-01-16 ENCOUNTER — Telehealth: Payer: Self-pay | Admitting: Family Medicine

## 2020-01-16 NOTE — Telephone Encounter (Signed)
Left message for patient to call back and schedule Medicare Annual Wellness Visit (AWV) either virtually or in office.  Last AWV 01/14/19; please schedule at anytime with [facility] Nurse Health Advisor 2.  This should be a 45 minute visit.

## 2020-01-28 ENCOUNTER — Telehealth: Payer: Self-pay | Admitting: *Deleted

## 2020-01-28 ENCOUNTER — Encounter: Payer: Self-pay | Admitting: Family Medicine

## 2020-01-28 NOTE — Telephone Encounter (Signed)
Noted, we will take care of it during her visit in December.

## 2020-01-28 NOTE — Telephone Encounter (Signed)
Patient called stating she received a call over a week ago to schedule a annual wellness visit. Patient states she has an upcoming appointment with Dr Martinique and she wants to just see her. 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

## 2020-01-28 NOTE — Telephone Encounter (Signed)
It is okay to do both in December. Thanks, BJ

## 2020-01-28 NOTE — Telephone Encounter (Signed)
Do you want to do it during her visit in December?

## 2020-02-10 ENCOUNTER — Ambulatory Visit: Payer: PPO | Admitting: Family Medicine

## 2020-02-12 ENCOUNTER — Encounter: Payer: Self-pay | Admitting: Family Medicine

## 2020-02-23 ENCOUNTER — Other Ambulatory Visit: Payer: Self-pay | Admitting: Family Medicine

## 2020-02-23 DIAGNOSIS — I10 Essential (primary) hypertension: Secondary | ICD-10-CM

## 2020-02-24 ENCOUNTER — Encounter: Payer: Self-pay | Admitting: Family Medicine

## 2020-02-24 DIAGNOSIS — M85851 Other specified disorders of bone density and structure, right thigh: Secondary | ICD-10-CM | POA: Diagnosis not present

## 2020-02-24 DIAGNOSIS — M85852 Other specified disorders of bone density and structure, left thigh: Secondary | ICD-10-CM | POA: Diagnosis not present

## 2020-02-24 DIAGNOSIS — M810 Age-related osteoporosis without current pathological fracture: Secondary | ICD-10-CM | POA: Diagnosis not present

## 2020-03-10 ENCOUNTER — Other Ambulatory Visit: Payer: Self-pay

## 2020-03-10 ENCOUNTER — Ambulatory Visit (INDEPENDENT_AMBULATORY_CARE_PROVIDER_SITE_OTHER): Payer: PPO | Admitting: Family Medicine

## 2020-03-10 ENCOUNTER — Encounter: Payer: Self-pay | Admitting: Family Medicine

## 2020-03-10 ENCOUNTER — Telehealth: Payer: Self-pay

## 2020-03-10 VITALS — BP 138/72 | HR 64 | Temp 97.8°F | Resp 16 | Ht 63.0 in | Wt 122.0 lb

## 2020-03-10 DIAGNOSIS — R7303 Prediabetes: Secondary | ICD-10-CM

## 2020-03-10 DIAGNOSIS — Z0000 Encounter for general adult medical examination without abnormal findings: Secondary | ICD-10-CM

## 2020-03-10 DIAGNOSIS — R6889 Other general symptoms and signs: Secondary | ICD-10-CM

## 2020-03-10 DIAGNOSIS — E7800 Pure hypercholesterolemia, unspecified: Secondary | ICD-10-CM | POA: Diagnosis not present

## 2020-03-10 DIAGNOSIS — M816 Localized osteoporosis [Lequesne]: Secondary | ICD-10-CM | POA: Diagnosis not present

## 2020-03-10 DIAGNOSIS — I10 Essential (primary) hypertension: Secondary | ICD-10-CM

## 2020-03-10 MED ORDER — DENOSUMAB 60 MG/ML ~~LOC~~ SOSY: 60.0000 mg | PREFILLED_SYRINGE | Freq: Once | SUBCUTANEOUS | 0 refills | Status: AC

## 2020-03-10 NOTE — Telephone Encounter (Signed)
Insurance verification started for Prolia 

## 2020-03-10 NOTE — Telephone Encounter (Signed)
Pt would like prolia.

## 2020-03-10 NOTE — Patient Instructions (Addendum)
  Molly Wilcox , Thank you for taking time to come for your Medicare Wellness Visit. I appreciate your ongoing commitment to your health goals. Please review the following plan we discussed and let me know if I can assist you in the future.   These are the goals we discussed: Goals    . Exercise 3x per week (30 min per time)     Weightbearing exercises 2 times per week.    . patient     Keep going to Molly Wilcox's  Will keep going there as much as you can       This is a list of the screening recommended for you and due dates:  Health Maintenance  Topic Date Due  . Tetanus Vaccine  09/28/2026  . Flu Shot  Completed  . DEXA scan (bone density measurement)  Completed  . COVID-19 Vaccine  Completed  . Pneumonia vaccines  Completed   A few things to remember from today's visit:   Localized osteoporosis without current pathological fracture  Medicare annual wellness visit, subsequent  Forgetfulness - Plan: Vitamin B12  Essential hypertension - Plan: COMPLETE METABOLIC PANEL WITH GFR  Pure hypercholesterolemia - Plan: Lipid panel  Prediabetes - Plan: Hemoglobin A1c  If you need refills please call your pharmacy. Do not use My Chart to request refills or for acute issues that need immediate attention.    Please be sure medication list is accurate. If a new problem present, please set up appointment sooner than planned today.  A few tips:  -As we age balance is not as good as it was, so there is a higher risks for falls. Please remove small rugs and furniture that is "in your way" and could increase the risk of falls. Stretching exercises may help with fall prevention: Yoga and Tai Chi are some examples. Low impact exercise is better, so you are not very achy the next day.  -Sun screen and avoidance of direct sun light recommended. Caution with dehydration, if working outdoors be sure to drink enough fluids.  - Some medications are not safe as we age, increases the risk of  side effects and can potentially interact with other medication you are also taken;  including some of over the counter medications. Be sure to let me know when you start a new medication even if it is a dietary/vitamin supplement.   -Healthy diet low in red meet/animal fat and sugar + regular physical activity is recommended.

## 2020-03-10 NOTE — Progress Notes (Signed)
HPI: Ms.Opha Avi Guida is a pleasant 79 y.o. female, who is here today for AWV and 6 months follow up.   She was last seen on 08/05/19. No new problems since her last visit.  She lives alone. Independent ADL's and IADL's. No falls in the past year and denies depression symptoms.  Functional Status Survey: Is the patient deaf or have difficulty hearing?: No Does the patient have difficulty seeing, even when wearing glasses/contacts?: No Does the patient have difficulty concentrating, remembering, or making decisions?: No Does the patient have difficulty walking or climbing stairs?: No Does the patient have difficulty dressing or bathing?: No Does the patient have difficulty doing errands alone such as visiting a doctor's office or shopping?: No  Fall Risk  03/10/2020 10/28/2019 01/14/2019 10/31/2018 03/21/2016  Falls in the past year? 0 0 0 (No Data) No  Comment - Emmi Telephone Survey: data to providers prior to load - Emmi Telephone Survey: data to providers prior to load -  Number falls in past yr: 0 - 0 (No Data) -  Comment - - - Emmi Telephone Survey Actual Response =  -  Injury with Fall? 0 - 0 - -  Follow up Education provided - Education provided - -   Providers she sees regularly: Eye care provider: Dr Wynetta Emery  Endocrinologist: Multinodular goiter. Podiatrists: Dr Jacqualyn Posey. 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  Depression screen [facility] 2/9 03/10/2020  Decreased Interest 0  Down, Depressed, Hopeless 0  PHQ - 2 Score 0    Mini-Cog - 03/10/20 1018    Normal clock drawing test? yes    How many words correct? 3           Hearing Screening   '125Hz'  '250Hz'  '500Hz'  '1000Hz'  '2000Hz'  '3000Hz'  '4000Hz'  '6000Hz'  '8000Hz'   Right ear:           Left ear:             Visual Acuity Screening   Right eye Left eye Both eyes  Without correction:     With correction: '20/40 20/40 20/40 '   She is enjoying photo. Going out with friends. She follows a healthful  diet. Walks about 5 times per week, 10,000 steps.  Osteoporosis: She is on Fasamax 70 mg weekly. She has been on medication for at least 5 years. She takes Ca++ with vit D supplementation. Tolerating medication well.  Prediabetes: Negative for polydipsia,polyuria, or polyphagia. HgA1C in 08/2019 was 5.9.  DEXA on 02/24/20 showed osteoporosis L2-L4, -2.9 (-3.3).  HTN:She is on Amlodipine 10 mg daily and Triamterene-HCTZ 37.5-25 mg daily. Negative for severe/frequent headache, visual changes, chest pain, dyspnea, palpitation, focal weakness, or worsening edema.  Component     Latest Ref Rng & Units 08/05/2019  Sodium     135 - 146 mmol/L 138  Potassium     3.5 - 5.3 mmol/L 3.9  Chloride     98 - 110 mmol/L 98  CO2     20 - 32 mmol/L 31  Glucose     65 - 99 mg/dL 97  BUN     7 - 25 mg/dL 26 (H)  Creatinine     0.60 - 0.88 mg/dL 0.86  Calcium     8.6 - 10.4 mg/dL 9.9  GFR     >60.00 mL/min 63.55   She is concerned about memory problems. Having trouble with word finding, it takes her a couple min to remember words. Problem is not affecting social interactions.  She is still paying bills on time.  HLD: She is on Simvastatin 10 mg daily.  Component     Latest Ref Rng & Units 01/14/2019  Cholesterol     <200 mg/dL 165  Triglycerides     <150 mg/dL 111.0  HDL Cholesterol     > OR = 50 mg/dL 63.20  VLDL     0.0 - 40.0 mg/dL 22.2  LDL (calc)     0 - 99 mg/dL 80  Total CHOL/HDL Ratio     <5.0 (calc) 3  NonHDL      102.16   Review of Systems  Constitutional: Negative for activity change, appetite change, fatigue and fever.  HENT: Negative for mouth sores, nosebleeds and sore throat.   Respiratory: Negative for cough and wheezing.   Gastrointestinal: Negative for abdominal pain, nausea and vomiting.       Negative for changes in bowel habits.  Endocrine: Negative for cold intolerance and heat intolerance.  Genitourinary: Negative for decreased urine volume, dysuria  and hematuria.  Musculoskeletal: Negative for gait problem and myalgias.  Neurological: Negative for syncope and facial asymmetry.  Psychiatric/Behavioral: Negative for confusion and hallucinations.  Rest of ROS, see pertinent positives sand negatives in HPI  Current Outpatient Medications on File Prior to Visit  Medication Sig Dispense Refill  . amLODipine (NORVASC) 10 MG tablet TAKE 1 TABLET BY MOUTH EVERY DAY 90 tablet 2  . aspirin EC 81 MG tablet Take 81 mg by mouth daily.    . Ca Phosphate-Cholecalciferol (CALCIUM/VITAMIN D3 GUMMIES PO) Take 2 gummies by mouth daily.    [name] Kitchen docusate sodium (COLACE) 100 MG capsule Take 100 mg by mouth as needed for mild constipation.    [name] Kitchen MAGNESIUM OXIDE PO Take 250 mg by mouth daily.     [name] Kitchen oxybutynin (DITROPAN-XL) 5 MG 24 hr tablet Take 5 mg by mouth every morning.    . simvastatin (ZOCOR) 10 MG tablet TAKE 1 TABLET BY MOUTH EVERYDAY AT BEDTIME 90 tablet 2  . triamterene-hydrochlorothiazide (MAXZIDE-25) 37.5-25 MG tablet TAKE 1 TABLET BY MOUTH EVERY DAY 90 tablet 2  . Wheat Dextrin (BENEFIBER PO) Take by mouth.     No current facility-administered medications on file prior to visit.   Past Medical History:  Diagnosis Date  . High cholesterol   . Hypertension   . Osteoarthritis   . Raynaud disease    Allergies  Allergen Reactions  . Prednisone Other (See Comments)    Dehydration, amnesia   . Ibuprofen Nausea Only and Other (See Comments)    Nausea & headaches    Social History   Socioeconomic History  . Marital status: Widowed    Spouse name: Not on file  . Number of children: Not on file  . Years of education: Not on file  . Highest education level: Not on file  Occupational History  . Not on file  Tobacco Use  . Smoking status: Never Smoker  . Smokeless tobacco: Never Used  Substance and Sexual Activity  . Alcohol use: Yes  . Drug use: No  . Sexual activity: Not Currently  Other Topics Concern  . Not on file  Social History  Narrative  . Not on file   Social Determinants of Health   Financial Resource Strain: Not on file  Food Insecurity: Not on file  Transportation Needs: Not on file  Physical Activity: Not on file  Stress: Not on file  Social Connections: Not on file    Vitals:   03/10/20  0935  BP: 138/72  Pulse: 64  Resp: 16  Temp: 97.8 F (36.6 C)  SpO2: 93%   Wt Readings from Last 3 Encounters:  03/10/20 122 lb (55.3 kg)  09/03/19 126 lb (57.2 kg)  08/05/19 127 lb (57.6 kg)   Body mass index is 21.61 kg/m.  Physical Exam Vitals and nursing note reviewed.  Constitutional:      General: She is not in acute distress.    Appearance: She is well-developed.  HENT:     Head: Normocephalic and atraumatic.     Mouth/Throat:     Mouth: Mucous membranes are moist.     Pharynx: Oropharynx is clear.  Eyes:     Conjunctiva/sclera: Conjunctivae normal.     Pupils: Pupils are equal, round, and reactive to light.  Cardiovascular:     Rate and Rhythm: Normal rate and regular rhythm.     Pulses:          Dorsalis pedis pulses are 2+ on the right side and 2+ on the left side.     Heart sounds: No murmur heard.   Pulmonary:     Effort: Pulmonary effort is normal. No respiratory distress.     Breath sounds: Normal breath sounds.  Abdominal:     Palpations: Abdomen is soft. There is no hepatomegaly or mass.     Tenderness: There is no abdominal tenderness.  Musculoskeletal:     Right lower leg: 1+ Pitting Edema present.     Left lower leg: 1+ Pitting Edema present.  Lymphadenopathy:     Cervical: No cervical adenopathy.  Skin:    General: Skin is warm.     Findings: No erythema or rash.  Neurological:     Mental Status: She is alert and oriented to person, place, and time.     Cranial Nerves: No cranial nerve deficit.     Comments: Stable gait,not assisted.  Psychiatric:        Mood and Affect: Mood is not anxious or depressed.     Comments: Well groomed, good eye contact.    ASSESSMENT AND PLAN:  Ms. Helaine Yackel was seen today for AWV and 6 months follow-up.  Orders Placed This Encounter  Procedures  . Lipid panel  . COMPLETE METABOLIC PANEL WITH GFR  . Hemoglobin A1c  . Vitamin B12   Lab Results  Component Value Date   VITAMINB12 316 03/10/2020   Lab Results  Component Value Date   HGBA1C 5.6 03/10/2020   Lab Results  Component Value Date   CREATININE 0.79 03/10/2020   BUN 23 03/10/2020   NA 139 03/10/2020   K 4.7 03/10/2020   CL 99 03/10/2020   CO2 31 03/10/2020    Lab Results  Component Value Date   CHOL 152 03/10/2020   HDL 61 03/10/2020   LDLCALC 75 03/10/2020   TRIG 80 03/10/2020   CHOLHDL 2.5 03/10/2020   Lab Results  Component Value Date   ALT 21 03/10/2020   AST 24 03/10/2020   ALKPHOS 64 01/14/2019   BILITOT 0.7 03/10/2020    Medicare annual wellness visit, subsequent We discussed the importance of staying active, physically and mentally, as well as the benefits of a healthy/balnace diet. Low impact exercise that involve stretching and strengthing are ideal. Vaccines up to date. We discussed preventive screening for the next 5-10 years, summery of recommendations given in AVS. Fall prevention.  Advance directives and end of life discussed, she has POA (needs to be updated) and  living will.   Localized osteoporosis without current pathological fracture DEXA showed some improvement but not resolution of problem. Stop Fosamax. She agrees with trying Prolia q 6 months.  -     denosumab (PROLIA) 60 MG/ML SOSY injection; Inject 60 mg into the skin once for 1 dose.  Forgetfulness Hx and examination today do  Not suggest a serious process. Cognitive challenging exercises. Further recommendations according to lab results.  Essential hypertension BP adequately controlled  Continue Amlodipine 10 mg daily and triamterene-HCTZ 37.5-25 mg daily. Low salt diet.  Pure hypercholesterolemia Continue Simvastatin  10 mg daily. We will adjust treatment, if needed,according to FLP.  Prediabetes Healthy life style for primary prevention of diabetes. Further recommendations according to HgA1C result.   Return in about 6 months (around 09/08/2020) for HTN.   Skylah Delauter G. Martinique, MD  [facility]. [place] office.   Ms. Holness , Thank you for taking time to come for your Medicare Wellness Visit. I appreciate your ongoing commitment to your health goals. Please review the following plan we discussed and let me know if I can assist you in the future.   These are the goals we discussed: Goals    . Exercise 3x per week (30 min per time)     Weightbearing exercises 2 times per week.    . patient     Keep going to [name]  Will keep going there as much as you can       This is a list of the screening recommended for you and due dates:  Health Maintenance  Topic Date Due  . Tetanus Vaccine  09/28/2026  . Flu Shot  Completed  . DEXA scan (bone density measurement)  Completed  . COVID-19 Vaccine  Completed  . Pneumonia vaccines  Completed   A few things to remember from today's visit:   Localized osteoporosis without current pathological fracture  Medicare annual wellness visit, subsequent  Forgetfulness - Plan: Vitamin B12  Essential hypertension - Plan: COMPLETE METABOLIC PANEL WITH GFR  Pure hypercholesterolemia - Plan: Lipid panel  Prediabetes - Plan: Hemoglobin A1c  If you need refills please call your pharmacy. Do not use My Chart to request refills or for acute issues that need immediate attention.    Please be sure medication list is accurate. If a new problem present, please set up appointment sooner than planned today.  A few tips:  -As we age balance is not as good as it was, so there is a higher risks for falls. Please remove small rugs and furniture that is "in your way" and could increase the risk of falls. Stretching exercises may help with fall  prevention: Yoga and Tai Chi are some examples. Low impact exercise is better, so you are not very achy the next day.  -Sun screen and avoidance of direct sun light recommended. Caution with dehydration, if working outdoors be sure to drink enough fluids.  - Some medications are not safe as we age, increases the risk of side effects and can potentially interact with other medication you are also taken;  including some of over the counter medications. Be sure to let me know when you start a new medication even if it is a dietary/vitamin supplement.   -Healthy diet low in red meet/animal fat and sugar + regular physical activity is recommended.

## 2020-03-11 ENCOUNTER — Encounter: Payer: Self-pay | Admitting: Family Medicine

## 2020-03-11 LAB — COMPLETE METABOLIC PANEL WITH GFR
AG Ratio: 1.9 (calc) (ref 1.0–2.5)
ALT: 21 U/L (ref 6–29)
AST: 24 U/L (ref 10–35)
Albumin: 4.9 g/dL (ref 3.6–5.1)
Alkaline phosphatase (APISO): 59 U/L (ref 37–153)
BUN: 23 mg/dL (ref 7–25)
CO2: 31 mmol/L (ref 20–32)
Calcium: 10.5 mg/dL — ABNORMAL HIGH (ref 8.6–10.4)
Chloride: 99 mmol/L (ref 98–110)
Creat: 0.79 mg/dL (ref 0.60–0.88)
GFR, Est African American: 82 mL/min/{1.73_m2} (ref >=60)
GFR, Est Non African American: 71 mL/min/{1.73_m2} (ref >=60)
Globulin: 2.6 g/dL (calc) (ref 1.9–3.7)
Glucose, Bld: 95 mg/dL (ref 65–99)
Potassium: 4.7 mmol/L (ref 3.5–5.3)
Sodium: 139 mmol/L (ref 135–146)
Total Bilirubin: 0.7 mg/dL (ref 0.2–1.2)
Total Protein: 7.5 g/dL (ref 6.1–8.1)

## 2020-03-11 LAB — HEMOGLOBIN A1C
Hgb A1c MFr Bld: 5.6 % of total Hgb (ref <5.7)
Mean Plasma Glucose: 114 mg/dL
eAG (mmol/L): 6.3 mmol/L

## 2020-03-11 LAB — LIPID PANEL
Cholesterol: 152 mg/dL (ref <200)
HDL: 61 mg/dL (ref >=50)
LDL Cholesterol (Calc): 75 mg/dL (calc)
Non-HDL Cholesterol (Calc): 91 mg/dL (calc) (ref <130)
Total CHOL/HDL Ratio: 2.5 (calc) (ref <5.0)
Triglycerides: 80 mg/dL (ref <150)

## 2020-03-11 LAB — VITAMIN B12: Vitamin B-12: 316 pg/mL (ref 200–1100)

## 2020-03-16 ENCOUNTER — Encounter: Payer: Self-pay | Admitting: Family Medicine

## 2020-03-16 DIAGNOSIS — N6082 Other benign mammary dysplasias of left breast: Secondary | ICD-10-CM | POA: Diagnosis not present

## 2020-03-22 ENCOUNTER — Encounter: Payer: Self-pay | Admitting: Family Medicine

## 2020-03-23 NOTE — Telephone Encounter (Addendum)
Pt is calling in checking the status of the Prolia and would like to know if it is going to be covered by her insurance ([employer]) b/c she will have the same insurance next year.  Pt stated that she called the insurance and they told her that her portion will be only $200.00 for the prolia.  Pt stated that she is waiting on a call to let her know where she need to go from here and when she can start the injections.  Pt would like to have a call back.

## 2020-03-23 NOTE — Telephone Encounter (Signed)
See mychart encounter.   Okay for pt to set up Prolia injection?

## 2020-03-23 NOTE — Telephone Encounter (Signed)
Pt is calling in stating that she stopped taking the gummies that has her Vit-D in it and purchased OTC D-3 25 mcg 1000 iu and she would like to know if this is okay for her to take.  Pt would like to have a call back.

## 2020-03-30 NOTE — Telephone Encounter (Signed)
Patient is aware the cost of the Prolia injection is an estimated $250. Information given about the [facility] [employer] [facility]. Prolia scheduled for 04/07/20.

## 2020-04-01 NOTE — Telephone Encounter (Signed)
Rescheduled for 04/12/20 per patient request.

## 2020-04-01 NOTE — Telephone Encounter (Signed)
Pt is calling in stating that she may need to change her appointment but need to speak with Fleet Contras first about it.

## 2020-04-07 ENCOUNTER — Ambulatory Visit: Payer: Self-pay

## 2020-04-12 ENCOUNTER — Other Ambulatory Visit: Payer: Self-pay

## 2020-04-12 ENCOUNTER — Ambulatory Visit (INDEPENDENT_AMBULATORY_CARE_PROVIDER_SITE_OTHER): Payer: PPO

## 2020-04-12 DIAGNOSIS — M816 Localized osteoporosis [Lequesne]: Secondary | ICD-10-CM | POA: Diagnosis not present

## 2020-04-12 MED ORDER — DENOSUMAB 60 MG/ML ~~LOC~~ SOSY
60.0000 mg | PREFILLED_SYRINGE | Freq: Once | SUBCUTANEOUS | Status: AC
  Administered 2020-04-12: 60 mg via SUBCUTANEOUS

## 2020-04-12 NOTE — Progress Notes (Signed)
Patient came into the office for her Prolia injection. Injection given subq in the left arm. Pt tolerated injection well - given list of side effects (pt was aware of them prior to injection.)

## 2020-08-14 ENCOUNTER — Other Ambulatory Visit: Payer: Self-pay | Admitting: Family Medicine

## 2020-08-14 DIAGNOSIS — I10 Essential (primary) hypertension: Secondary | ICD-10-CM

## 2020-08-14 DIAGNOSIS — E7800 Pure hypercholesterolemia, unspecified: Secondary | ICD-10-CM

## 2020-09-03 ENCOUNTER — Telehealth: Payer: Self-pay

## 2020-09-03 NOTE — Telephone Encounter (Signed)
Patient is due for her next Prolia injection on 10/11/2020. Verification process started today.

## 2020-09-07 ENCOUNTER — Ambulatory Visit: Payer: PPO | Admitting: Endocrinology

## 2020-09-08 ENCOUNTER — Ambulatory Visit: Payer: PPO | Attending: Internal Medicine

## 2020-09-08 DIAGNOSIS — Z23 Encounter for immunization: Secondary | ICD-10-CM

## 2020-09-08 NOTE — Progress Notes (Signed)
   Covid-19 Vaccination Clinic  Name:  Molly Wilcox    MRN: [id number] DOB: Nov 29, 1940  09/08/2020  Ms. Pack was observed post Covid-19 immunization for 15 minutes without incident. She was provided with Vaccine Information Sheet and instruction to access the V-Safe system.   [name] was instructed to call 911 with any severe reactions post vaccine: [name] Kitchen Difficulty breathing  . Swelling of face and throat  . A fast heartbeat  . A bad rash all over body  . Dizziness and weakness   Immunizations Administered    Name Date Dose VIS Date Route   PFIZER Comrnaty(Gray TOP) Covid-19 Vaccine 09/08/2020 12:27 PM 0.3 mL 03/11/2020 Intramuscular   Manufacturer: [place]   Lot: [id number]   [place]: [phone]

## 2020-09-16 ENCOUNTER — Other Ambulatory Visit (HOSPITAL_COMMUNITY): Payer: Self-pay

## 2020-09-16 MED ORDER — COVID-19 MRNA VAC-TRIS(PFIZER) 30 MCG/0.3ML IM SUSP
INTRAMUSCULAR | 0 refills | Status: DC
Start: 2020-09-08 — End: 2020-10-27
  Filled 2020-09-16: qty 0.3, 17d supply, fill #0

## 2020-09-17 ENCOUNTER — Other Ambulatory Visit (HOSPITAL_COMMUNITY): Payer: Self-pay

## 2020-09-22 ENCOUNTER — Other Ambulatory Visit: Payer: Self-pay

## 2020-09-22 ENCOUNTER — Ambulatory Visit (INDEPENDENT_AMBULATORY_CARE_PROVIDER_SITE_OTHER): Payer: PPO | Admitting: Endocrinology

## 2020-09-22 ENCOUNTER — Encounter: Payer: Self-pay | Admitting: Endocrinology

## 2020-09-22 VITALS — BP 134/64 | HR 86 | Ht 63.0 in | Wt 122.2 lb

## 2020-09-22 DIAGNOSIS — E042 Nontoxic multinodular goiter: Secondary | ICD-10-CM | POA: Diagnosis not present

## 2020-09-22 LAB — T4, FREE: Free T4: 0.79 ng/dL (ref 0.60–1.60)

## 2020-09-22 LAB — TSH: TSH: 1.74 u[IU]/mL (ref 0.35–4.50)

## 2020-09-22 NOTE — Patient Instructions (Addendum)
Blood tests are requested for you today.  We'll let you know about the results.  I would be happy to see you back here as needed.

## 2020-09-22 NOTE — Progress Notes (Signed)
Subjective:    Patient ID: Molly Wilcox, female    DOB: Dec 20, 1940, 80 y.o.   MRN: [id number]  HPI Pt returns for f/u of multinodular goiter (dx'ed 2018; bx of LLP nodule in 2018 and 2020 both showed BENIGN FOLLICULAR NODULE (cat 2) she has been euthyroid off rx; [place] in 2021 was unchanged, and no f/u is advised).  pt states she feels well in general.  She does not notice the goiter.   Past Medical History:  Diagnosis Date   High cholesterol    Hypertension    Osteoarthritis    Raynaud disease     Past Surgical History:  Procedure Laterality Date   TUBAL LIGATION      Social History   Socioeconomic History   Marital status: Widowed    Spouse name: Not on file   Number of children: Not on file   Years of education: Not on file   Highest education level: Not on file  Occupational History   Not on file  Tobacco Use   Smoking status: Never   Smokeless tobacco: Never  Substance and Sexual Activity   Alcohol use: Yes   Drug use: No   Sexual activity: Not Currently  Other Topics Concern   Not on file  Social History Narrative   Not on file   Social Determinants of Health   Financial Resource Strain: Not on file  Food Insecurity: Not on file  Transportation Needs: Not on file  Physical Activity: Not on file  Stress: Not on file  Social Connections: Not on file  Intimate Partner Violence: Not on file    Current Outpatient Medications on File Prior to Visit  Medication Sig Dispense Refill   amLODipine (NORVASC) 10 MG tablet TAKE 1 TABLET BY MOUTH EVERY DAY 90 tablet 2   aspirin EC 81 MG tablet Take 81 mg by mouth daily.     COVID-19 mRNA Vac-TriS, Pfizer, SUSP injection Inject into the muscle. 0.3 mL 0   docusate sodium (COLACE) 100 MG capsule Take 100 mg by mouth as needed for mild constipation.     MAGNESIUM OXIDE PO Take 250 mg by mouth daily.      oxybutynin (DITROPAN-XL) 5 MG 24 hr tablet Take 5 mg by mouth every morning.     simvastatin (ZOCOR) 10 MG  tablet TAKE 1 TABLET BY MOUTH EVERYDAY AT BEDTIME 90 tablet 2   triamterene-hydrochlorothiazide (MAXZIDE-25) 37.5-25 MG tablet TAKE 1 TABLET BY MOUTH EVERY DAY 90 tablet 2   No current facility-administered medications on file prior to visit.    Allergies  Allergen Reactions   Prednisone Other (See Comments)    Dehydration, amnesia    Ibuprofen Nausea Only and Other (See Comments)    Nausea & headaches    Family History  Problem Relation Age of Onset   Heart disease Father    Thyroid disease Neg Hx     BP 134/64 (BP Location: Left Arm, Patient Position: Sitting, Cuff Size: Normal)   Pulse 86   Ht 5\' 3"  (1.6 m)   Wt 122 lb 3.2 oz (55.4 kg)   BMI 21.65 kg/m   Review of Systems     Objective:   Physical Exam NECK: thyroid is slightly enlarged, with irreg surface, but no palpable nodule.    Lab Results  Component Value Date   TSH 1.74 09/22/2020      Assessment & Plan:  MNG: due for recheck.  I requested [place].   Patient Instructions  Blood  tests are requested for you today.  We'll let you know about the results.  I would be happy to see you back here as needed.

## 2020-09-23 ENCOUNTER — Other Ambulatory Visit (HOSPITAL_COMMUNITY): Payer: Self-pay

## 2020-10-06 ENCOUNTER — Encounter: Payer: Self-pay | Admitting: Family Medicine

## 2020-10-08 ENCOUNTER — Other Ambulatory Visit: Payer: Self-pay | Admitting: Family Medicine

## 2020-10-08 DIAGNOSIS — Z298 Encounter for other specified prophylactic measures: Secondary | ICD-10-CM

## 2020-10-08 MED ORDER — ACETAZOLAMIDE 125 MG PO TABS: 125.0000 mg | ORAL_TABLET | Freq: Two times a day (BID) | ORAL | 0 refills | Status: DC

## 2020-10-08 NOTE — Telephone Encounter (Signed)
I usually recommend Acetazolamide 125 mg bid  to start 24 hours before trip until 2-3 days after arrival or decent. I am sending Rx to her pharmacy. Thanks, BJ

## 2020-10-08 NOTE — Telephone Encounter (Signed)
Pt is calling in to see if Dr. Martinique would be willing to prescribe the medication that she is requesting in her below msg before the Tuesday due to her flying out that morning.  Pt is aware that the provider was not in the office on Thursday to address the msg.

## 2020-10-11 ENCOUNTER — Ambulatory Visit: Payer: PPO | Attending: Critical Care Medicine

## 2020-10-11 DIAGNOSIS — Z20822 Contact with and (suspected) exposure to covid-19: Secondary | ICD-10-CM | POA: Diagnosis not present

## 2020-10-12 LAB — SARS-COV-2, NAA 2 DAY TAT

## 2020-10-12 LAB — NOVEL CORONAVIRUS, NAA: SARS-CoV-2, NAA: NOT DETECTED

## 2020-10-27 ENCOUNTER — Other Ambulatory Visit: Payer: Self-pay

## 2020-10-27 ENCOUNTER — Encounter: Payer: Self-pay | Admitting: Family Medicine

## 2020-10-27 ENCOUNTER — Ambulatory Visit: Payer: PPO

## 2020-10-27 ENCOUNTER — Ambulatory Visit (INDEPENDENT_AMBULATORY_CARE_PROVIDER_SITE_OTHER): Payer: PPO | Admitting: Family Medicine

## 2020-10-27 VITALS — BP 110/70 | HR 69 | Resp 16 | Ht 63.0 in | Wt 121.0 lb

## 2020-10-27 DIAGNOSIS — R58 Hemorrhage, not elsewhere classified: Secondary | ICD-10-CM

## 2020-10-27 DIAGNOSIS — I10 Essential (primary) hypertension: Secondary | ICD-10-CM

## 2020-10-27 DIAGNOSIS — H6123 Impacted cerumen, bilateral: Secondary | ICD-10-CM | POA: Diagnosis not present

## 2020-10-27 DIAGNOSIS — M816 Localized osteoporosis [Lequesne]: Secondary | ICD-10-CM

## 2020-10-27 DIAGNOSIS — W19XXXA Unspecified fall, initial encounter: Secondary | ICD-10-CM | POA: Diagnosis not present

## 2020-10-27 DIAGNOSIS — E559 Vitamin D deficiency, unspecified: Secondary | ICD-10-CM | POA: Diagnosis not present

## 2020-10-27 LAB — BASIC METABOLIC PANEL
BUN: 21 mg/dL (ref 6–23)
CO2: 29 mEq/L (ref 19–32)
Calcium: 9.5 mg/dL (ref 8.4–10.5)
Chloride: 97 mEq/L (ref 96–112)
Creatinine, Ser: 0.78 mg/dL (ref 0.40–1.20)
GFR: 71.53 mL/min (ref >60.00)
Glucose, Bld: 98 mg/dL (ref 70–99)
Potassium: 3.9 mEq/L (ref 3.5–5.1)
Sodium: 137 mEq/L (ref 135–145)

## 2020-10-27 LAB — CBC
HCT: 41.8 % (ref 36.0–46.0)
Hemoglobin: 14.3 g/dL (ref 12.0–15.0)
MCHC: 34.1 g/dL (ref 30.0–36.0)
MCV: 92.9 fl (ref 78.0–100.0)
Platelets: 281 10*3/uL (ref 150.0–400.0)
RBC: 4.5 Mil/uL (ref 3.87–5.11)
RDW: 12.3 % (ref 11.5–15.5)
WBC: 5.6 10*3/uL (ref 4.0–10.5)

## 2020-10-27 LAB — VITAMIN D 25 HYDROXY (VIT D DEFICIENCY, FRACTURES): VITD: 42.57 ng/mL (ref 30.00–100.00)

## 2020-10-27 MED ORDER — DENOSUMAB 60 MG/ML ~~LOC~~ SOSY
60.0000 mg | PREFILLED_SYRINGE | Freq: Once | SUBCUTANEOUS | Status: AC
  Administered 2020-10-27: 60 mg via SUBCUTANEOUS

## 2020-10-27 NOTE — Progress Notes (Signed)
Chief Complaint  Patient presents with   Follow-up   HPI: Molly Wilcox is a 80 y.o. female, who is here today to follow on recent [place] visit. She was last seen on 03/10/20.  Hypertension:  Medications:Triamterene-HCTZ 37.5-25 mg daily and Amlodipine 10 mg daily. BP readings at home:No for the last week, before 130/70's. Side effects:None.  Negative for unusual or severe headache, visual changes, exertional chest pain, dyspnea,  focal weakness, or edema.  Lab Results  Component Value Date   CREATININE 0.79 03/10/2020   BUN 23 03/10/2020   NA 139 03/10/2020   K 4.7 03/10/2020   CL 99 03/10/2020   CO2 31 03/10/2020   She recently came back from [facility].  She took Acetazolamide while she was hiking and did not have any problem while doing so. She did have a fall ,walking a curve,tripped and landed on her knees. No serious injury, did not see medical attention. She applied neosporin on superficial knee excoriations.  Noted ears "clogged up", intermittent. Hx of cerumen impaction. Negative for earache,hearing loss,nasal congestion,or dizziness.  She is concerned about coloration changes left toes. She wonders if she is having circulation problems. She noted problem 3rd day in [place], she wonders if her hiking boots were too tight. No limitation of movement or pain. She has not noted easy bruising,nose bleed,gross hematuria,blood in stool,or melena.  She takes daily Aspirin 81 mg.  Osteoporosis and vit D deficiency: She is on Prolia q 6 months. DEXA 02/24/20. She has tolerated medication well. She is on Ca++ and vit D supplementations. 25 OH vit D on 08/05/19 was 46.  Review of Systems  Constitutional:  Negative for activity change, appetite change, fatigue and fever.  HENT:  Negative for ear discharge and mouth sores.   Respiratory:  Negative for cough and wheezing.   Gastrointestinal:  Negative for abdominal pain, nausea and vomiting.        Negative for changes in bowel habits.  Genitourinary:  Negative for decreased urine volume and dysuria.  Musculoskeletal:  Negative for gait problem and myalgias.  Skin:  Negative for pallor and rash.  Neurological:  Negative for syncope, facial asymmetry and weakness.  Psychiatric/Behavioral:  Negative for confusion. The patient is not nervous/anxious.   Rest see pertinent positives and negatives per HPI.  Current Outpatient Medications on File Prior to Visit  Medication Sig Dispense Refill   amLODipine (NORVASC) 10 MG tablet TAKE 1 TABLET BY MOUTH EVERY DAY 90 tablet 2   aspirin EC 81 MG tablet Take 81 mg by mouth daily.     docusate sodium (COLACE) 100 MG capsule Take 100 mg by mouth as needed for mild constipation.     MAGNESIUM OXIDE PO Take 250 mg by mouth daily.      oxybutynin (DITROPAN-XL) 5 MG 24 hr tablet Take 5 mg by mouth every morning.     simvastatin (ZOCOR) 10 MG tablet TAKE 1 TABLET BY MOUTH EVERYDAY AT BEDTIME 90 tablet 2   triamterene-hydrochlorothiazide (MAXZIDE-25) 37.5-25 MG tablet TAKE 1 TABLET BY MOUTH EVERY DAY 90 tablet 2   No current facility-administered medications on file prior to visit.   Past Medical History:  Diagnosis Date   High cholesterol    Hypertension    Osteoarthritis    Raynaud disease    Allergies  Allergen Reactions   Prednisone Other (See Comments)    Dehydration, amnesia    Ibuprofen Nausea Only and Other (See Comments)    Nausea &  headaches   Social History   Socioeconomic History   Marital status: Widowed    Spouse name: Not on file   Number of children: Not on file   Years of education: Not on file   Highest education level: Not on file  Occupational History   Not on file  Tobacco Use   Smoking status: Never   Smokeless tobacco: Never  Substance and Sexual Activity   Alcohol use: Yes   Drug use: No   Sexual activity: Not Currently  Other Topics Concern   Not on file  Social History Narrative   Not on file    Social Determinants of Health   Financial Resource Strain: Not on file  Food Insecurity: Not on file  Transportation Needs: Not on file  Physical Activity: Not on file  Stress: Not on file  Social Connections: Not on file   Vitals:   10/27/20 0957  BP: 110/70  Pulse: 69  Resp: 16  SpO2: 98%   Body mass index is 21.43 kg/m.  Physical Exam Vitals and nursing note reviewed.  Constitutional:      General: She is not in acute distress.    Appearance: She is well-developed and normal weight.  HENT:     Head: Normocephalic and atraumatic.     Right Ear: External ear normal.     Left Ear: External ear normal.     Ears:     Comments: Gross hearing intact. Cerumen excess bilateral, not able to see TM's.     Mouth/Throat:     Mouth: Mucous membranes are moist.     Pharynx: Oropharynx is clear.  Eyes:     Conjunctiva/sclera: Conjunctivae normal.  Neck:     Thyroid: Thyromegaly present.  Cardiovascular:     Rate and Rhythm: Normal rate and regular rhythm.     Pulses:          Dorsalis pedis pulses are 2+ on the right side and 2+ on the left side.     Heart sounds: No murmur heard.    Comments: Varicose veins LE, bilateral. Pulmonary:     Effort: Pulmonary effort is normal. No respiratory distress.     Breath sounds: Normal breath sounds.  Abdominal:     Palpations: Abdomen is soft. There is no hepatomegaly or mass.     Tenderness: There is no abdominal tenderness.  Lymphadenopathy:     Cervical: No cervical adenopathy.  Skin:    General: Skin is warm.     Findings: Ecchymosis (dorsal aspect of left toes, 1st to 4th.Also left knee) present. No erythema or rash.     Comments: Hyperpigmentation skin changes distal LE's,pretibial.   Neurological:     General: No focal deficit present.     Mental Status: She is alert and oriented to person, place, and time.     Cranial Nerves: No cranial nerve deficit.     Gait: Gait normal.  Psychiatric:     Comments: Well groomed,  good eye contact.   ASSESSMENT AND PLAN:  Molly Wilcox was seen today for follow-up.  Diagnoses and all orders for this visit: Orders Placed This Encounter  Procedures   CBC   Basic metabolic panel   VITAMIN D 25 Hydroxy (Vit-D Deficiency, Fractures)   Lab Results  Component Value Date   WBC 5.6 10/27/2020   HGB 14.3 10/27/2020   HCT 41.8 10/27/2020   MCV 92.9 10/27/2020   PLT 281.0 10/27/2020   Lab Results  Component Value Date  CREATININE 0.78 10/27/2020   BUN 21 10/27/2020   NA 137 10/27/2020   K 3.9 10/27/2020   CL 97 10/27/2020   CO2 29 10/27/2020   Fall, initial encounter No serious injury. Fall precautions discussed.  Localized osteoporosis without current pathological fracture Continue Prolia q 6 months. Ca++ and vot D supplementation. Fall prevention and regular physical activity + wt bearing exercises x 2/week.  -     denosumab (PROLIA) injection 60 mg  Essential hypertension BP adequately controlled. Continue current management: Triamterene-HCTZ and Amlodipine same dose. DASH/low salt diet to continue. Monitor BP at home. Eye exam recommended annually.  Ecchymosis Left-sided toes and left knee. Left knee after fall. She could have also injured toes during fall or could be caused by shoe wear. We discussed some side effects of Aspirin. Instructed about warning signs.  Vitamin D deficiency Continue vit D supplementation same dose. Further recommendations according to 25 OH vit D result.  Excessive cerumen in ear canal, bilateral It is not impacted, so we can hold on ear lavage. Recommend OTC debrox in both ears and follow as needed. Instructed about warning signs.  Return in about 6 months (around 04/29/2021) for cpe.   Molly Brandi G. Martinique, MD  [facility]. [place] office.

## 2020-10-27 NOTE — Patient Instructions (Signed)
A few things to remember from today's visit:   Essential hypertension - Plan: Basic metabolic panel  Localized osteoporosis without current pathological fracture - Plan: denosumab (PROLIA) injection 60 mg  Ecchymosis - Plan: CBC  Vitamin D deficiency - Plan: VITAMIN D 25 Hydroxy (Vit-D Deficiency, Fractures)  Continue monitoring, bruise could have been caused by tight boot. Fall precautions.  If you need refills please call your pharmacy. Do not use My Chart to request refills or for acute issues that need immediate attention.    Please be sure medication list is accurate. If a new problem present, please set up appointment sooner than planned today.

## 2020-10-30 ENCOUNTER — Encounter: Payer: Self-pay | Admitting: Family Medicine

## 2020-10-30 MED ORDER — TRIAMTERENE-HCTZ 37.5-25 MG PO TABS: 1.0000 | ORAL_TABLET | Freq: Every day | ORAL | 2 refills | Status: DC

## 2020-12-21 ENCOUNTER — Encounter: Payer: Self-pay | Admitting: Family Medicine

## 2021-03-04 ENCOUNTER — Other Ambulatory Visit: Payer: Self-pay

## 2021-03-07 ENCOUNTER — Ambulatory Visit (INDEPENDENT_AMBULATORY_CARE_PROVIDER_SITE_OTHER): Payer: PPO

## 2021-03-07 VITALS — Ht 63.0 in | Wt 121.0 lb

## 2021-03-07 DIAGNOSIS — Z0000 Encounter for general adult medical examination without abnormal findings: Secondary | ICD-10-CM | POA: Diagnosis not present

## 2021-03-07 NOTE — Progress Notes (Signed)
I connected with Arbutus Ped today by telephone and verified that I am speaking with the correct person using two identifiers. Location patient: home Location provider: work Persons participating in the virtual visit: Shawntae, Lowy LPN.   I discussed the limitations, risks, security and privacy concerns of performing an evaluation and management service by telephone and the availability of in person appointments. I also discussed with the patient that there may be a patient responsible charge related to this service. The patient expressed understanding and verbally consented to this telephonic visit.    Interactive audio and video telecommunications were attempted between this provider and patient, however failed, due to patient having technical difficulties OR patient did not have access to video capability.  We continued and completed visit with audio only.     Vital signs may be patient reported or missing.  Subjective:   Molly Wilcox is a 80 y.o. female who presents for Medicare Annual (Subsequent) preventive examination.  Review of Systems     Cardiac Risk Factors include: advanced age (>36men, >71 women);hypertension     Objective:    Today's Vitals   03/07/21 1426  Weight: 121 lb (54.9 kg)  Height: 5\' 3"  (1.6 m)   Body mass index is 21.43 kg/m.  Advanced Directives 03/07/2021 03/21/2016  Does Patient Have a Medical Advance Directive? Yes Yes  Type of Advance Directive Living will -    Current Medications (verified) Outpatient Encounter Medications as of 03/07/2021  Medication Sig   amLODipine (NORVASC) 10 MG tablet TAKE 1 TABLET BY MOUTH EVERY DAY   aspirin EC 81 MG tablet Take 81 mg by mouth daily.   docusate sodium (COLACE) 100 MG capsule Take 100 mg by mouth as needed for mild constipation.   MAGNESIUM OXIDE PO Take 250 mg by mouth daily.    oxybutynin (DITROPAN-XL) 5 MG 24 hr tablet Take 5 mg by mouth every morning.   simvastatin  (ZOCOR) 10 MG tablet TAKE 1 TABLET BY MOUTH EVERYDAY AT BEDTIME   triamterene-hydrochlorothiazide (MAXZIDE-25) 37.5-25 MG tablet Take 1 tablet by mouth daily.   No facility-administered encounter medications on file as of 03/07/2021.    Allergies (verified) Prednisone and Ibuprofen   History: Past Medical History:  Diagnosis Date   High cholesterol    Hypertension    Osteoarthritis    Raynaud disease    Past Surgical History:  Procedure Laterality Date   TUBAL LIGATION     Family History  Problem Relation Age of Onset   Heart disease Father    Thyroid disease Neg Hx    Social History   Socioeconomic History   Marital status: Widowed    Spouse name: Not on file   Number of children: Not on file   Years of education: Not on file   Highest education level: Not on file  Occupational History   Not on file  Tobacco Use   Smoking status: Never   Smokeless tobacco: Never  Vaping Use   Vaping Use: Never used  Substance and Sexual Activity   Alcohol use: Yes    Comment: social   Drug use: No   Sexual activity: Not Currently  Other Topics Concern   Not on file  Social History Narrative   Not on file   Social Determinants of Health   Financial Resource Strain: Low Risk    Difficulty of Paying Living Expenses: Not hard at all  Food Insecurity: No Food Insecurity   Worried About Estate manager/land agent of Food  in the Last Year: Never true   [place] in the Last Year: Never true  Transportation Needs: No Transportation Needs   Lack of Transportation (Medical): No   Lack of Transportation (Non-Medical): No  Physical Activity: Sufficiently Active   Days of Exercise per Week: 7 days   Minutes of Exercise per Session: 90 min  Stress: No Stress Concern Present   Feeling of Stress : Not at all  Social Connections: Not on file    Tobacco Counseling Counseling given: Not Answered   Clinical Intake:  Pre-visit preparation completed: Yes  Pain : No/denies pain      Nutritional Status: BMI of 19-24  Normal Nutritional Risks: None Diabetes: No  How often do you need to have someone help you when you read instructions, pamphlets, or other written materials from your doctor or pharmacy?: 1 - Never What is the last grade level you completed in school?: Masters degree  Diabetic? no  Interpreter Needed?: No  Information entered by :: NAllen LPN   Activities of Daily Living In your present state of health, do you have any difficulty performing the following activities: 03/07/2021 03/06/2021  Hearing? N N  Vision? N N  Difficulty concentrating or making decisions? N N  Walking or climbing stairs? N N  Dressing or bathing? N N  Doing errands, shopping? N N  Preparing Food and eating ? N N  Using the Toilet? N N  In the past six months, have you accidently leaked urine? Y Y  Do you have problems with loss of bowel control? N N  Managing your Medications? N N  Managing your Finances? N N  Housekeeping or managing your Housekeeping? N N  Some recent data might be hidden    Patient Care Team: Martinique, Betty G, MD as PCP - General (Family Medicine)  Indicate any recent Medical Services you may have received from other than [facility] providers in the past year (date may be approximate).     Assessment:   This is a routine wellness examination for Molly Wilcox.  Hearing/Vision screen Vision Screening - Comments:: No regular eye exams, [facility]  Dietary issues and exercise activities discussed: Current Exercise Habits: Home exercise routine, Type of exercise: walking, Time (Minutes): > 60, Frequency (Times/Week): 7, Weekly Exercise (Minutes/Week): 0   Goals Addressed             This Visit's Progress    Patient Stated       03/07/2021, continue walking       Depression Screen PHQ 2/9 Scores 03/07/2021 03/11/2020 01/14/2019 05/28/2017 03/21/2016  PHQ - 2 Score 0 0 0 0 0    Fall Risk Fall Risk  03/07/2021 03/06/2021 03/10/2020 10/28/2019  01/14/2019  Falls in the past year? 0 0 0 0 0  Comment - - - Emmi Telephone Survey: data to providers prior to load -  Number falls in past yr: - 0 0 - 0  Comment - - - - -  Injury with Fall? - 0 0 - 0  Risk for fall due to : Medication side effect - - - -  Follow up Falls evaluation completed;Education provided;Falls prevention discussed - Education provided - Education provided    FALL RISK PREVENTION PERTAINING TO THE HOME:  Any stairs in or around the home? No  If so, are there any without handrails? N/a Home free of loose throw rugs in walkways, pet beds, electrical cords, etc? Yes  Adequate lighting in your home to  reduce risk of falls? Yes   ASSISTIVE DEVICES UTILIZED TO PREVENT FALLS:  Life alert? No  Use of a cane, walker or w/c? No  Grab bars in the bathroom? Yes  Shower chair or bench in shower? No  Elevated toilet seat or a handicapped toilet? No   TIMED UP AND GO:  Was the test performed? No .      Cognitive Function: MMSE - Mini Mental State Exam 03/21/2016  Not completed: (No Data)        Immunizations Immunization History  Administered Date(s) Administered   Fluad Quad(high Dose 65+) 12/25/2018, 12/21/2020   Influenza, High Dose Seasonal PF 01/02/2016, 01/29/2017, 01/05/2018, 01/05/2020   Influenza-Unspecified 01/29/2017, 01/01/2018, 12/25/2018   PFIZER Comirnaty(Gray Top)Covid-19 Tri-Sucrose Vaccine 09/08/2020   PFIZER(Purple Top)SARS-COV-2 Vaccination 05/27/2019, 06/17/2019, 01/29/2020   Pneumococcal Conjugate-13 03/30/2014   Pneumococcal Polysaccharide-23 12/29/2014   Tdap 09/27/2016   Zoster Recombinat (Shingrix) 01/28/2019, 04/12/2019    TDAP status: Up to date  Flu Vaccine status: Up to date  Pneumococcal vaccine status: Up to date  Covid-19 vaccine status: Completed vaccines  Qualifies for Shingles Vaccine? Yes   Zostavax completed No   Shingrix Completed?: Yes  Screening Tests Health Maintenance  Topic Date Due   COVID-19  Vaccine (5 - Booster for Pfizer series) 11/03/2020   TETANUS/TDAP  09/28/2026   Pneumonia Vaccine 85+ Years old  Completed   INFLUENZA VACCINE  Completed   DEXA SCAN  Completed   Zoster Vaccines- Shingrix  Completed   HPV VACCINES  Aged Out    Health Maintenance  Health Maintenance Due  Topic Date Due   COVID-19 Vaccine (5 - Booster for [place] series) 11/03/2020    Colorectal cancer screening: Type of screening: Colonoscopy. Completed 05/06/2013. Repeat every 10 years  Mammogram status: Completed 03/16/2020. Repeat every year  Bone Density status: Completed 02/24/2020. Results reflect: Bone density results: OSTEOPOROSIS. Repeat every 2 years.  Lung Cancer Screening: (Low Dose CT Chest recommended if Age 23-80 years, 30 pack-year currently smoking OR have quit w/in 15years.) does not qualify.   Lung Cancer Screening Referral: no  Additional Screening:  Hepatitis C Screening: does not qualify;   Vision Screening: Recommended annual ophthalmology exams for early detection of glaucoma and other disorders of the eye. Is the patient up to date with their annual eye exam?  Yes  Who is the provider or what is the name of the office in which the patient attends annual eye exams? [facility] If pt is not established with a provider, would they like to be referred to a provider to establish care? No .   Dental Screening: Recommended annual dental exams for proper oral hygiene  Community Resource Referral / Chronic Care Management: CRR required this visit?  No   CCM required this visit?  No      Plan:     I have personally reviewed and noted the following in the patient's chart:   Medical and social history Use of alcohol, tobacco or illicit drugs  Current medications and supplements including opioid prescriptions.  Functional ability and status Nutritional status Physical activity Advanced directives List of other physicians Hospitalizations, surgeries, and ER visits in  previous 12 months Vitals Screenings to include cognitive, depression, and falls Referrals and appointments  In addition, I have reviewed and discussed with patient certain preventive protocols, quality metrics, and best practice recommendations. A written personalized care plan for preventive services as well as general preventive health recommendations were provided to patient.  Kellie Simmering, LPN   [id number]/[id number]   Nurse Notes: 6 CIT not administered. Patient appeared cognitive per conversation.

## 2021-03-07 NOTE — Patient Instructions (Signed)
[name] , Thank you for taking time to come for your Medicare Wellness Visit. I appreciate your ongoing commitment to your health goals. Please review the following plan we discussed and let me know if I can assist you in the future.   Screening recommendations/referrals: Colonoscopy: completed 05/06/2013 Mammogram: completed 03/16/2020 Bone Density: completed 02/24/2020 Recommended yearly ophthalmology/optometry visit for glaucoma screening and checkup Recommended yearly dental visit for hygiene and checkup  Vaccinations: Influenza vaccine: completed 12/21/2020 Pneumococcal vaccine: completed 12/29/2014 Tdap vaccine: completed 09/27/2016, due 09/28/2026 Shingles vaccine: completed   Covid-19: 09/08/2020, 01/29/2020, 06/17/2019, 05/27/2019  Advanced directives: Please bring a copy of your POA (Power of Attorney) and/or Living Will to your next appointment.   Conditions/risks identified: none  Next appointment: Follow up in one year for your annual wellness visit    Preventive Care 65 Years and Older, Female Preventive care refers to lifestyle choices and visits with your health care provider that can promote health and wellness. What does preventive care include? A yearly physical exam. This is also called an annual well check. Dental exams once or twice a year. Routine eye exams. Ask your health care provider how often you should have your eyes checked. Personal lifestyle choices, including: Daily care of your teeth and gums. Regular physical activity. Eating a healthy diet. Avoiding tobacco and drug use. Limiting alcohol use. Practicing safe sex. Taking low-dose aspirin every day. Taking vitamin and mineral supplements as recommended by your health care provider. What happens during an annual well check? The services and screenings done by your health care provider during your annual well check will depend on 80, overall health, lifestyle risk factors, and family history of  disease. Counseling  Your health care provider may ask you questions about your: Alcohol use. Tobacco use. Drug use. Emotional well-being. Home and relationship well-being. Sexual activity. Eating habits. History of falls. Memory and ability to understand (cognition). Work and work Statistician. Reproductive health. Screening  You may have the following tests or measurements: Height, weight, and BMI. Blood pressure. Lipid and cholesterol levels. These may be checked every 5 years, or more frequently if you are over 62 years old. Skin check. Lung cancer screening. You may have this screening every year starting at age 81 if you have a 30-pack-year history of smoking and currently smoke or have quit within the past 15 years. Fecal occult blood test (FOBT) of the stool. You may have this test every year starting at age 81. Flexible sigmoidoscopy or colonoscopy. You may have a sigmoidoscopy every 5 years or a colonoscopy every 10 years starting at age 81. Hepatitis C blood test. Hepatitis B blood test. Sexually transmitted disease (STD) testing. Diabetes screening. This is done by checking your blood sugar (glucose) after you have not eaten for a while (fasting). You may have this done every 1-3 years. Bone density scan. This is done to screen for osteoporosis. You may have this done starting at age 81. Mammogram. This may be done every 1-2 years. Talk to your health care provider about how often you should have regular mammograms. Talk with your health care provider about your test results, treatment options, and if necessary, the need for more tests. Vaccines  Your health care provider may recommend certain vaccines, such as: Influenza vaccine. This is recommended every year. Tetanus, diphtheria, and acellular pertussis (Tdap, Td) vaccine. You may need a Td booster every 10 years. Zoster vaccine. You may need this after age 81. Pneumococcal 13-valent conjugate (PCV13) vaccine. One  dose is recommended after age 81. Pneumococcal polysaccharide (PPSV23) vaccine. One dose is recommended after age 81. Talk to your health care provider about which screenings and vaccines you need and how often you need them. This information is not intended to replace advice given to you by your health care provider. Make sure you discuss any questions you have with your health care provider. Document Released: 04/16/2015 Document Revised: 12/08/2015 Document Reviewed: 01/19/2015 Elsevier Interactive Patient Education  2017 [place] Prevention in the Home Falls can cause injuries. They can happen to people of all ages. There are many things you can do to make your home safe and to help prevent falls. What can I do on the outside of my home? Regularly fix the edges of walkways and driveways and fix any cracks. Remove anything that might make you trip as you walk through a door, such as a raised step or threshold. Trim any bushes or trees on the path to your home. Use bright outdoor lighting. Clear any walking paths of anything that might make someone trip, such as rocks or tools. Regularly check to see if handrails are loose or broken. Make sure that both sides of any steps have handrails. Any raised decks and porches should have guardrails on the edges. Have any leaves, snow, or ice cleared regularly. Use sand or salt on walking paths during winter. Clean up any spills in your garage right away. This includes oil or grease spills. What can I do in the bathroom? Use night lights. Install grab bars by the toilet and in the tub and shower. Do not use towel bars as grab bars. Use non-skid mats or decals in the tub or shower. If you need to sit down in the shower, use a plastic, non-slip stool. Keep the floor dry. Clean up any water that spills on the floor as soon as it happens. Remove soap buildup in the tub or shower regularly. Attach bath mats securely with double-sided  non-slip rug tape. Do not have throw rugs and other things on the floor that can make you trip. What can I do in the bedroom? Use night lights. Make sure that you have a light by your bed that is easy to reach. Do not use any sheets or blankets that are too big for your bed. They should not hang down onto the floor. Have a firm chair that has side arms. You can use this for support while you get dressed. Do not have throw rugs and other things on the floor that can make you trip. What can I do in the kitchen? Clean up any spills right away. Avoid walking on wet floors. Keep items that you use a lot in easy-to-reach places. If you need to reach something above you, use a strong step stool that has a grab bar. Keep electrical cords out of the way. Do not use floor polish or wax that makes floors slippery. If you must use wax, use non-skid floor wax. Do not have throw rugs and other things on the floor that can make you trip. What can I do with my stairs? Do not leave any items on the stairs. Make sure that there are handrails on both sides of the stairs and use them. Fix handrails that are broken or loose. Make sure that handrails are as long as the stairways. Check any carpeting to make sure that it is firmly attached to the stairs. Fix any carpet that is loose or worn. Avoid  having throw rugs at the top or bottom of the stairs. If you do have throw rugs, attach them to the floor with carpet tape. Make sure that you have a light switch at the top of the stairs and the bottom of the stairs. If you do not have them, ask someone to add them for you. What else can I do to help prevent falls? Wear shoes that: Do not have high heels. Have rubber bottoms. Are comfortable and fit you well. Are closed at the toe. Do not wear sandals. If you use a stepladder: Make sure that it is fully opened. Do not climb a closed stepladder. Make sure that both sides of the stepladder are locked into place. Ask  someone to hold it for you, if possible. Clearly mark and make sure that you can see: Any grab bars or handrails. First and last steps. Where the edge of each step is. Use tools that help you move around (mobility aids) if they are needed. These include: Canes. Walkers. Scooters. Crutches. Turn on the lights when you go into a dark area. Replace any light bulbs as soon as they burn out. Set up your furniture so you have a clear path. Avoid moving your furniture around. If any of your floors are uneven, fix them. If there are any pets around you, be aware of where they are. Review your medicines with your doctor. Some medicines can make you feel dizzy. This can increase your chance of falling. Ask your doctor what other things that you can do to help prevent falls. This information is not intended to replace advice given to you by your health care provider. Make sure you discuss any questions you have with your health care provider. Document Released: 01/14/2009 Document Revised: 08/26/2015 Document Reviewed: 04/24/2014 Elsevier Interactive Patient Education  2017 [employer].

## 2021-03-08 ENCOUNTER — Ambulatory Visit: Payer: PPO

## 2021-03-08 ENCOUNTER — Other Ambulatory Visit: Payer: Self-pay

## 2021-03-08 MED ORDER — PFIZER COVID-19 VAC BIVALENT 30 MCG/0.3ML IM SUSP
INTRAMUSCULAR | 0 refills | Status: DC
  Filled 2021-03-08: qty 0.3, 1d supply, fill #0

## 2021-03-17 DIAGNOSIS — Z1231 Encounter for screening mammogram for malignant neoplasm of breast: Secondary | ICD-10-CM | POA: Diagnosis not present

## 2021-03-17 LAB — HM MAMMOGRAPHY

## 2021-03-22 ENCOUNTER — Encounter: Payer: Self-pay | Admitting: Family Medicine

## 2021-03-22 ENCOUNTER — Other Ambulatory Visit: Payer: Self-pay

## 2021-03-22 ENCOUNTER — Ambulatory Visit: Payer: PPO | Attending: Internal Medicine

## 2021-03-22 DIAGNOSIS — Z23 Encounter for immunization: Secondary | ICD-10-CM

## 2021-03-22 MED ORDER — PFIZER COVID-19 VAC BIVALENT 30 MCG/0.3ML IM SUSP
INTRAMUSCULAR | 0 refills | Status: DC
  Filled 2021-03-22: qty 0.3, 1d supply, fill #0

## 2021-03-22 NOTE — Progress Notes (Signed)
° °  Covid-19 Vaccination Clinic  Name:  Mittie Knittel    MRN: [id number] DOB: 06-02-1940  03/22/2021  Ms. Stamos was observed post Covid-19 immunization for 15 minutes without incident. She was provided with Vaccine Information Sheet and instruction to access the V-Safe system.   Ms. Gossett was instructed to call 911 with any severe reactions post vaccine: Difficulty breathing  Swelling of face and throat  A fast heartbeat  A bad rash all over body  Dizziness and weakness   Immunizations Administered     Name Date Dose VIS Date Route   Pfizer Covid-19 Vaccine Bivalent Booster 03/22/2021 11:13 AM 0.3 mL 12/01/2020 Intramuscular   Manufacturer: [place]   Lot: [id number]   [place]: [address]-6        Covid-19 Vaccination Clinic  Name:  Salle Brandle    MRN: [id number] DOB: 21-Jan-1941  03/22/2021  Ms. Winslett was observed post Covid-19 immunization for 15 minutes without incident. She was provided with Vaccine Information Sheet and instruction to access the V-Safe system.   Ms. Doolen was instructed to call 911 with any severe reactions post vaccine: Difficulty breathing  Swelling of face and throat  A fast heartbeat  A bad rash all over body  Dizziness and weakness   Immunizations Administered     Name Date Dose VIS Date Route   Pfizer Covid-19 Vaccine Bivalent Booster 03/22/2021 11:13 AM 0.3 mL 12/01/2020 Intramuscular   Manufacturer: [place]   Lot: [id number]   [place]: [place], PharmD, MBA Clinical Acute Care Pharmacist

## 2021-04-29 ENCOUNTER — Encounter: Payer: PPO | Admitting: Family Medicine

## 2021-05-03 NOTE — Progress Notes (Signed)
HPI: Molly Wilcox is a 81 y.o. female, who is here today for her routine physical.  Last CPE: 03/10/20  Regular exercise 3 or more time per week: She walks her dog almost daily. Following a healthy diet: In general she tries to cook healthy meals at home. Ice cream once per week, she ate more sweets during holidays. Chronic medical problems: osteoporosis, HLD,HTN,prediabetes,and vit D def among some.  Immunization History  Administered Date(s) Administered   Fluad Quad(high Dose 65+) 12/25/2018, 12/21/2020   Influenza, High Dose Seasonal PF 01/02/2016, 01/29/2017, 01/05/2018, 01/05/2020   Influenza-Unspecified 01/29/2017, 01/01/2018, 12/25/2018   PFIZER Comirnaty(Gray Top)Covid-19 Tri-Sucrose Vaccine 09/08/2020   PFIZER(Purple Top)SARS-COV-2 Vaccination 05/27/2019, 06/17/2019, 01/29/2020   Pfizer Covid-19 Vaccine Bivalent Booster 13yrs & up 03/22/2021   Pneumococcal Conjugate-13 03/30/2014   Pneumococcal Polysaccharide-23 12/29/2014   Tdap 09/27/2016   Zoster Recombinat (Shingrix) 01/28/2019, 04/12/2019   Health Maintenance  Topic Date Due   TETANUS/TDAP  09/28/2026   Pneumonia Vaccine 51+ Years old  Completed   INFLUENZA VACCINE  Completed   DEXA SCAN  Completed   COVID-19 Vaccine  Completed   Zoster Vaccines- Shingrix  Completed   HPV VACCINES  Aged Out   She has no new concerns today.  HTN: She is on Amlodipine 10 mg daily and triamterene-HCTZ 37.5-25 mg daily. BP's at home sometimes 110's-130's/70's.  Lab Results  Component Value Date   CREATININE 0.78 10/27/2020   BUN 21 10/27/2020   NA 137 10/27/2020   K 3.9 10/27/2020   CL 97 10/27/2020   CO2 29 10/27/2020  HLD on simvastatin 10 mg daily. She has tolerated medication well.  Lab Results  Component Value Date   CHOL 152 03/10/2020   HDL 61 03/10/2020   LDLCALC 75 03/10/2020   TRIG 80 03/10/2020   CHOLHDL 2.5 03/10/2020   Prediabetes: Negative for polydipsia,polyuria, or polyphagia. HgA1C was  6.2 in 01/2019 and 5.9 in 08/2019.  Lab Results  Component Value Date   HGBA1C 5.6 03/10/2020   She is still taking benefiber, which has helped with bowel habits regularity.  Osteoporosis on Prolia q 6 months. Vit D def, she is on Vit D3 ? U 3 times per week.  She has appt with derma for nose skin lesion.  Review of Systems  Constitutional:  Negative for activity change, appetite change, fatigue and fever.  HENT:  Negative for mouth sores, nosebleeds, sore throat and trouble swallowing.   Eyes:  Negative for redness and visual disturbance.  Respiratory:  Negative for cough, shortness of breath and wheezing.   Cardiovascular:  Negative for chest pain, palpitations and leg swelling.  Gastrointestinal:  Negative for abdominal pain, blood in stool, nausea and vomiting.       Negative for changes in bowel habits.  Endocrine: Negative for cold intolerance and heat intolerance.  Genitourinary:  Negative for decreased urine volume, dysuria and hematuria.  Musculoskeletal:  Positive for arthralgias. Negative for gait problem and myalgias.  Skin:  Negative for pallor and wound.  Allergic/Immunologic: Negative for environmental allergies.  Neurological:  Negative for syncope, weakness and headaches.  Psychiatric/Behavioral:  Negative for behavioral problems and confusion.   All other systems reviewed and are negative.  Current Outpatient Medications on File Prior to Visit  Medication Sig Dispense Refill   aspirin EC 81 MG tablet Take 81 mg by mouth daily.     docusate sodium (COLACE) 100 MG capsule Take 100 mg by mouth as needed for mild constipation.     MAGNESIUM  OXIDE PO Take 250 mg by mouth daily.      oxybutynin (DITROPAN-XL) 5 MG 24 hr tablet Take 5 mg by mouth every morning.     triamterene-hydrochlorothiazide (MAXZIDE-25) 37.5-25 MG tablet Take 1 tablet by mouth daily. 90 tablet 2   No current facility-administered medications on file prior to visit.   Past Medical History:   Diagnosis Date   High cholesterol    Hypertension    Osteoarthritis    Raynaud disease    Past Surgical History:  Procedure Laterality Date   TUBAL LIGATION     Allergies  Allergen Reactions   Prednisone Other (See Comments)    Dehydration, amnesia    Ibuprofen Nausea Only and Other (See Comments)    Nausea & headaches   Family History  Problem Relation Age of Onset   Heart disease Father    Thyroid disease Neg Hx    Social History   Socioeconomic History   Marital status: Widowed    Spouse name: Not on file   Number of children: Not on file   Years of education: Not on file   Highest education level: Not on file  Occupational History   Not on file  Tobacco Use   Smoking status: Never   Smokeless tobacco: Never  Vaping Use   Vaping Use: Never used  Substance and Sexual Activity   Alcohol use: Yes    Comment: social   Drug use: No   Sexual activity: Not Currently  Other Topics Concern   Not on file  Social History Narrative   Not on file   Social Determinants of Health   Financial Resource Strain: Low Risk    Difficulty of Paying Living Expenses: Not hard at all  Food Insecurity: No Food Insecurity   Worried About Charity fundraiser in the Last Year: Never true   [place] in the Last Year: Never true  Transportation Needs: No Transportation Needs   Lack of Transportation (Medical): No   Lack of Transportation (Non-Medical): No  Physical Activity: Sufficiently Active   Days of Exercise per Week: 7 days   Minutes of Exercise per Session: 90 min  Stress: No Stress Concern Present   Feeling of Stress : Not at all  Social Connections: Not on file   Vitals:   05/04/21 1047  BP: 102/70  Pulse: 88  Resp: 16  SpO2: 97%   Body mass index is 21.08 kg/m.  Wt Readings from Last 3 Encounters:  05/04/21 119 lb (54 kg)  03/07/21 121 lb (54.9 kg)  10/27/20 121 lb (54.9 kg)   Physical Exam Vitals and nursing note reviewed.  Constitutional:       General: She is not in acute distress.    Appearance: She is well-developed and normal weight.  HENT:     Head: Normocephalic and atraumatic.     Right Ear: External ear normal. Tympanic membrane is not erythematous.     Left Ear: External ear normal. Tympanic membrane is not erythematous.     Ears:     Comments: Cerumen excess in ear canals, TM seen partially.    Mouth/Throat:     Mouth: Mucous membranes are moist.     Pharynx: Oropharynx is clear. Uvula midline.  Eyes:     Conjunctiva/sclera: Conjunctivae normal.     Pupils: Pupils are equal, round, and reactive to light.  Neck:     Trachea: No tracheal deviation.  Cardiovascular:     Rate and Rhythm:  Normal rate and regular rhythm.     Pulses:          Dorsalis pedis pulses are 2+ on the right side and 2+ on the left side.     Heart sounds: No murmur heard. Pulmonary:     Effort: Pulmonary effort is normal. No respiratory distress.     Breath sounds: Normal breath sounds.  Abdominal:     Palpations: Abdomen is soft. There is no hepatomegaly or mass.     Tenderness: There is no abdominal tenderness.  Genitourinary:    Comments: Deferred to gyn. Musculoskeletal:     Comments: No major deformity or signs of synovitis appreciated.  Lymphadenopathy:     Cervical: No cervical adenopathy.  Skin:    General: Skin is warm.     Findings: Lesion (bridge of nose,right-sided, papular irregular lesion, 1-2 mm.) present. No erythema or rash.  Neurological:     General: No focal deficit present.     Mental Status: She is alert and oriented to person, place, and time.     Cranial Nerves: No cranial nerve deficit.     Coordination: Coordination normal.     Gait: Gait normal.     Deep Tendon Reflexes:     Reflex Scores:      Bicep reflexes are 2+ on the right side and 2+ on the left side.      Patellar reflexes are 2+ on the right side and 2+ on the left side. Psychiatric:        Mood and Affect: Mood and affect normal.     Comments:  Well groomed, good eye contact.   ASSESSMENT AND PLAN:  Molly Wilcox was here today annual physical examination.  Orders Placed This Encounter  Procedures   Comprehensive metabolic panel   Hemoglobin A1c   Lipid panel   VITAMIN D 25 Hydroxy (Vit-D Deficiency, Fractures)   Lab Results  Component Value Date   CREATININE 0.96 05/04/2021   BUN 20 05/04/2021   NA 137 05/04/2021   K 3.7 05/04/2021   CL 99 05/04/2021   CO2 31 05/04/2021   Lab Results  Component Value Date   ALT 18 05/04/2021   AST 22 05/04/2021   ALKPHOS 43 05/04/2021   BILITOT 0.7 05/04/2021   Lab Results  Component Value Date   CHOL 152 05/04/2021   HDL 61.50 05/04/2021   LDLCALC 75 05/04/2021   TRIG 81.0 05/04/2021   CHOLHDL 2 05/04/2021   Lab Results  Component Value Date   HGBA1C 5.9 05/04/2021   Routine general medical examination at a health care facility We discussed the importance of regular physical activity and healthy diet for prevention of chronic illness and/or complications. Preventive guidelines reviewed. Vaccination up to dated. Ca++ and vit D supplementation recommended. Next CPE in a year.  Essential hypertension BP today mildly low, recommend monitoring BP at home and let me know the readings in about 2 to 3 weeks. For now continue amlodipine 10 mg daily and triamterene-HCTZ 37.5-25 mg daily. Continue low-salt diet.  Osteoporosis Continue Prolia every 6 months. We discussed the importance of adequate calcium and vitamin D supplementation. We will plan on repeating DEXA with her next mammogram, 03/2022.  Vitamin D deficiency She is not sure about how many units is she taking. Continue same dose of vitamin D supplementation. Further recommendation will be given according to 25 [place] vitamin D result.  Prediabetes Encouraged to continue a healthy lifestyle for diabetes prevention. Further recommendation will  be given according to hemoglobin A1c result.  Pure  hypercholesterolemia Continue simvastatin 10 mg daily and low-fat diet. Further recommendation will be given according to lipid panel result.  Return in 6 months (on 11/01/2021) for HTN.  Caralina Nop G. [place], MD  [facility]. [place] office.

## 2021-05-04 ENCOUNTER — Other Ambulatory Visit: Payer: Self-pay | Admitting: Family Medicine

## 2021-05-04 ENCOUNTER — Encounter: Payer: Self-pay | Admitting: Family Medicine

## 2021-05-04 ENCOUNTER — Ambulatory Visit (INDEPENDENT_AMBULATORY_CARE_PROVIDER_SITE_OTHER): Payer: PPO | Admitting: Family Medicine

## 2021-05-04 VITALS — BP 102/70 | HR 88 | Resp 16 | Ht 63.0 in | Wt 119.0 lb

## 2021-05-04 DIAGNOSIS — R7303 Prediabetes: Secondary | ICD-10-CM | POA: Diagnosis not present

## 2021-05-04 DIAGNOSIS — I10 Essential (primary) hypertension: Secondary | ICD-10-CM | POA: Diagnosis not present

## 2021-05-04 DIAGNOSIS — Z0000 Encounter for general adult medical examination without abnormal findings: Secondary | ICD-10-CM

## 2021-05-04 DIAGNOSIS — M816 Localized osteoporosis [Lequesne]: Secondary | ICD-10-CM | POA: Diagnosis not present

## 2021-05-04 DIAGNOSIS — E559 Vitamin D deficiency, unspecified: Secondary | ICD-10-CM

## 2021-05-04 DIAGNOSIS — E7800 Pure hypercholesterolemia, unspecified: Secondary | ICD-10-CM | POA: Diagnosis not present

## 2021-05-04 LAB — LIPID PANEL
Cholesterol: 152 mg/dL (ref 0–200)
HDL: 61.5 mg/dL (ref >39.00)
LDL Cholesterol: 75 mg/dL (ref 0–99)
NonHDL: 90.82
Total CHOL/HDL Ratio: 2
Triglycerides: 81 mg/dL (ref 0.0–149.0)
VLDL: 16.2 mg/dL (ref 0.0–40.0)

## 2021-05-04 LAB — COMPREHENSIVE METABOLIC PANEL
ALT: 18 U/L (ref 0–35)
AST: 22 U/L (ref 0–37)
Albumin: 4.8 g/dL (ref 3.5–5.2)
Alkaline Phosphatase: 43 U/L (ref 39–117)
BUN: 20 mg/dL (ref 6–23)
CO2: 31 mEq/L (ref 19–32)
Calcium: 9.8 mg/dL (ref 8.4–10.5)
Chloride: 99 mEq/L (ref 96–112)
Creatinine, Ser: 0.96 mg/dL (ref 0.40–1.20)
GFR: 55.55 mL/min — ABNORMAL LOW (ref >60.00)
Glucose, Bld: 110 mg/dL — ABNORMAL HIGH (ref 70–99)
Potassium: 3.7 mEq/L (ref 3.5–5.1)
Sodium: 137 mEq/L (ref 135–145)
Total Bilirubin: 0.7 mg/dL (ref 0.2–1.2)
Total Protein: 7.6 g/dL (ref 6.0–8.3)

## 2021-05-04 LAB — VITAMIN D 25 HYDROXY (VIT D DEFICIENCY, FRACTURES): VITD: 53.06 ng/mL (ref 30.00–100.00)

## 2021-05-04 LAB — HEMOGLOBIN A1C: Hgb A1c MFr Bld: 5.9 % (ref 4.6–6.5)

## 2021-05-04 MED ORDER — DENOSUMAB 60 MG/ML ~~LOC~~ SOSY
60.0000 mg | PREFILLED_SYRINGE | Freq: Once | SUBCUTANEOUS | Status: AC
  Administered 2021-05-04: 60 mg via SUBCUTANEOUS

## 2021-05-04 NOTE — Patient Instructions (Addendum)
A few things to remember from today's visit:  Routine general medical examination at a health care facility  Essential hypertension - Plan: Comprehensive metabolic panel  Localized osteoporosis without current pathological fracture - Plan: denosumab (PROLIA) injection 60 mg  Vitamin D deficiency - Plan: VITAMIN D 25 Hydroxy (Vit-D Deficiency, Fractures)  Prediabetes - Plan: Hemoglobin A1c  Pure hypercholesterolemia - Plan: Lipid panel  If you need refills please call your pharmacy. Do not use My Chart to request refills or for acute issues that need immediate attention.   Calcium 1200 mg daily, 600 mg 2 times daily. Vit D [id number] U daily. Monitor blood pressure at home and let me know about readings in 2-3 weeks.  Please be sure medication list is accurate. If a new problem present, please set up appointment sooner than planned today.  Preventive Care 71 Years and Older, Female Preventive care refers to lifestyle choices and visits with your health care provider that can promote health and wellness. Preventive care visits are also called wellness exams. What can I expect for my preventive care visit? Counseling Your health care provider may ask you questions about your: Medical history, including: Past medical problems. Family medical history. Pregnancy and menstrual history. History of falls. Current health, including: Memory and ability to understand (cognition). Emotional well-being. Home life and relationship well-being. Sexual activity and sexual health. Lifestyle, including: Alcohol, nicotine or tobacco, and drug use. Access to firearms. Diet, exercise, and sleep habits. Work and work Statistician. Sunscreen use. Safety issues such as seatbelt and bike helmet use. Physical exam Your health care provider will check your: Height and weight. These may be used to calculate your BMI (body mass index). BMI is a measurement that tells if you are at a healthy  weight. Waist circumference. This measures the distance around your waistline. This measurement also tells if you are at a healthy weight and may help predict your risk of certain diseases, such as type 2 diabetes and high blood pressure. Heart rate and blood pressure. Body temperature. Skin for abnormal spots. What immunizations do I need? Vaccines are usually given at various ages, according to a schedule. Your health care provider will recommend vaccines for you based on your age, medical history, and lifestyle or other factors, such as travel or where you work. What tests do I need? Screening Your health care provider may recommend screening tests for certain conditions. This may include: Lipid and cholesterol levels. Hepatitis C test. Hepatitis B test. HIV (human immunodeficiency virus) test. STI (sexually transmitted infection) testing, if you are at risk. Lung cancer screening. Colorectal cancer screening. Diabetes screening. This is done by checking your blood sugar (glucose) after you have not eaten for a while (fasting). Mammogram. Talk with your health care provider about how often you should have regular mammograms. BRCA-related cancer screening. This may be done if you have a family history of breast, ovarian, tubal, or peritoneal cancers. Bone density scan. This is done to screen for osteoporosis. Talk with your health care provider about your test results, treatment options, and if necessary, the need for more tests. Follow these instructions at home: Eating and drinking  Eat a diet that includes fresh fruits and vegetables, whole grains, lean protein, and low-fat dairy products. Limit your intake of foods with high amounts of sugar, saturated fats, and salt. Take vitamin and mineral supplements as recommended by your health care provider. Do not drink alcohol if your health care provider tells you not to drink. If you drink  alcohol: Limit how much you have to 0-1 drink a  day. Know how much alcohol is in your drink. In the [place] one drink equals one 12 oz bottle of beer (355 mL), one 5 oz glass of wine (148 mL), or one 1 oz glass of hard liquor (44 mL). Lifestyle Brush your teeth every morning and night with fluoride toothpaste. Floss one time each day. Exercise for at least 30 minutes 5 or more days each week. Do not use any products that contain nicotine or tobacco. These products include cigarettes, chewing tobacco, and vaping devices, such as e-cigarettes. If you need help quitting, ask your health care provider. Do not use drugs. If you are sexually active, practice safe sex. Use a condom or other form of protection in order to prevent STIs. Take aspirin only as told by your health care provider. Make sure that you understand how much to take and what form to take. Work with your health care provider to find out whether it is safe and beneficial for you to take aspirin daily. Ask your health care provider if you need to take a cholesterol-lowering medicine (statin). Find healthy ways to manage stress, such as: Meditation, yoga, or listening to music. Journaling. Talking to a trusted person. Spending time with friends and family. Minimize exposure to UV radiation to reduce your risk of skin cancer. Safety Always wear your seat belt while driving or riding in a vehicle. Do not drive: If you have been drinking alcohol. Do not ride with someone who has been drinking. When you are tired or distracted. While texting. If you have been using any mind-altering substances or drugs. Wear a helmet and other protective equipment during sports activities. If you have firearms in your house, make sure you follow all gun safety procedures. What's next? Visit your health care provider once a year for an annual wellness visit. Ask your health care provider how often you should have your eyes and teeth checked. Stay up to date on all vaccines. This information is not  intended to replace advice given to you by your health care provider. Make sure you discuss any questions you have with your health care provider. Document Revised: 09/15/2020 Document Reviewed: 09/15/2020 Elsevier Patient Education  [place].

## 2021-05-04 NOTE — Assessment & Plan Note (Signed)
She is not sure about how many units is she taking. Continue same dose of vitamin D supplementation. Further recommendation will be given according to 25 OH vitamin D result.

## 2021-05-04 NOTE — Assessment & Plan Note (Signed)
Continue simvastatin 10 mg daily and low-fat diet. Further recommendation will be given according to lipid panel result. 

## 2021-05-04 NOTE — Assessment & Plan Note (Signed)
Encouraged to continue a healthy lifestyle for diabetes prevention. Further recommendation will be given according to hemoglobin A1c result.

## 2021-05-04 NOTE — Assessment & Plan Note (Signed)
Continue Prolia every 6 months. We discussed the importance of adequate calcium and vitamin D supplementation. We will plan on repeating DEXA with her next mammogram, 03/2022.

## 2021-05-04 NOTE — Assessment & Plan Note (Signed)
BP today mildly low, recommend monitoring BP at home and let me know the readings in about 2 to 3 weeks. For now continue amlodipine 10 mg daily and triamterene-HCTZ 37.5-25 mg daily. Continue low-salt diet.

## 2021-05-05 ENCOUNTER — Encounter: Payer: Self-pay | Admitting: Family Medicine

## 2021-05-05 ENCOUNTER — Other Ambulatory Visit: Payer: Self-pay | Admitting: Family Medicine

## 2021-05-05 DIAGNOSIS — E7800 Pure hypercholesterolemia, unspecified: Secondary | ICD-10-CM

## 2021-05-05 IMAGING — US US THYROID
1 series · 13 of 25 positions shown · non-contrast
Comparison: 10/11/2017

CLINICAL DATA: Multinodular goiter

EXAM:
THYROID ULTRASOUND
TECHNIQUE: Ultrasound examination of the thyroid gland and adjacent soft
tissues was performed.

[Series 1: us thyroid · 0.03mm/px · 13 of 69 slices shown]
[im 1/69]
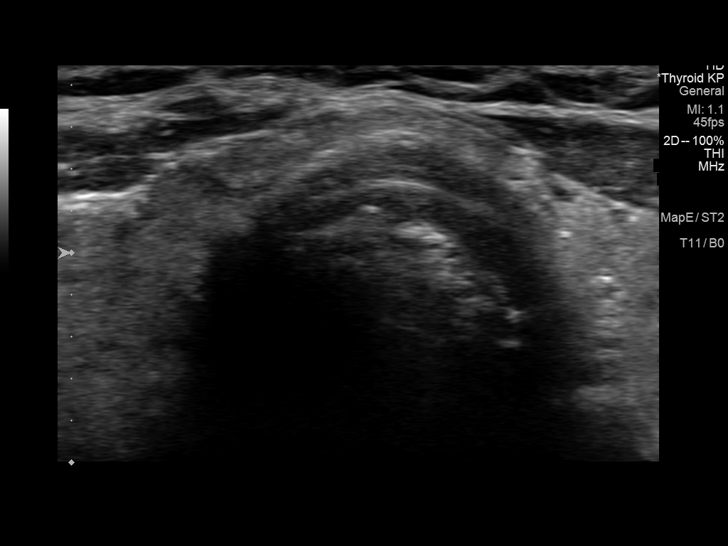
[im 6/69]
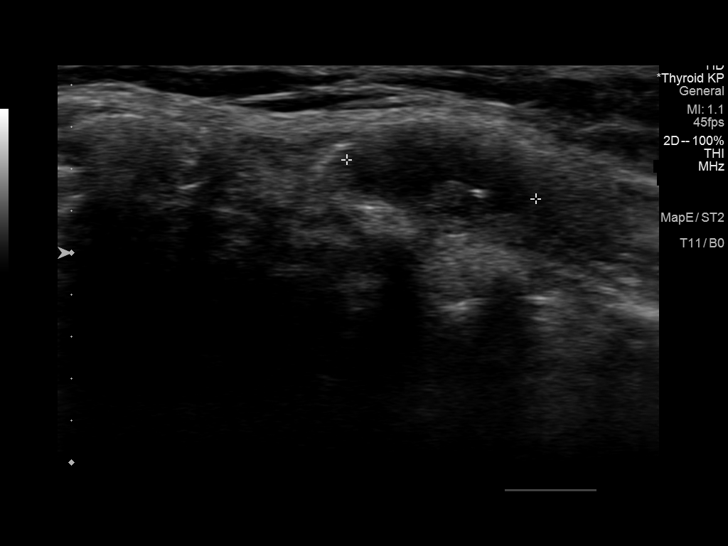
[im 12/69]
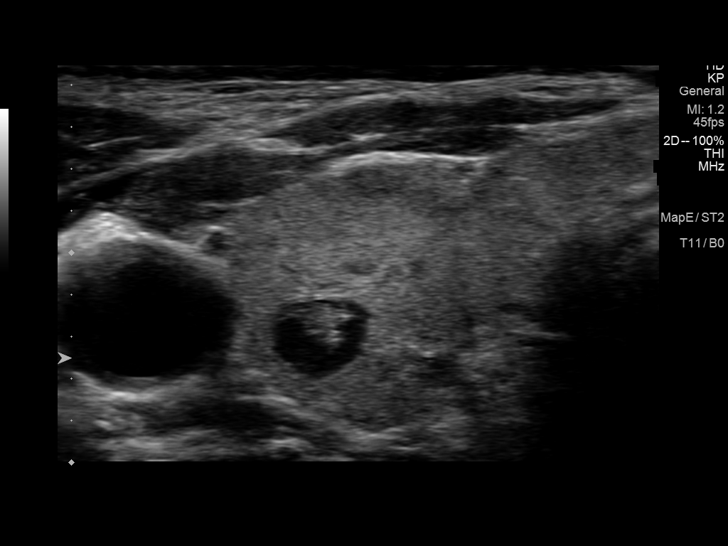
[im 18/69]
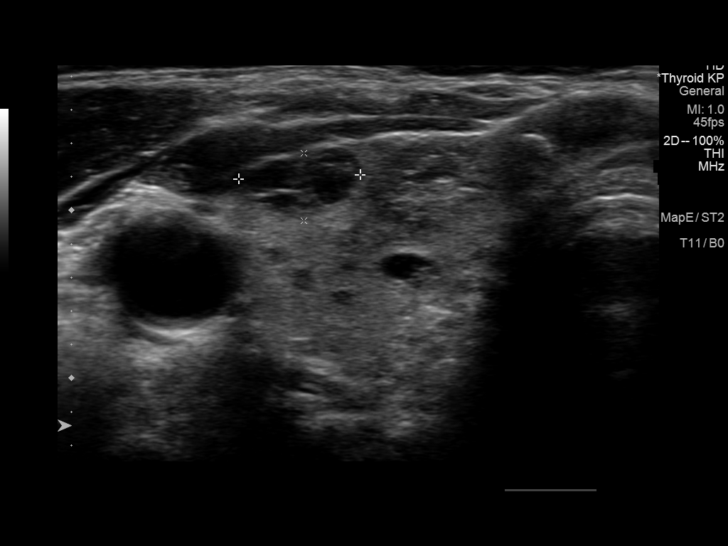
[im 23/69]
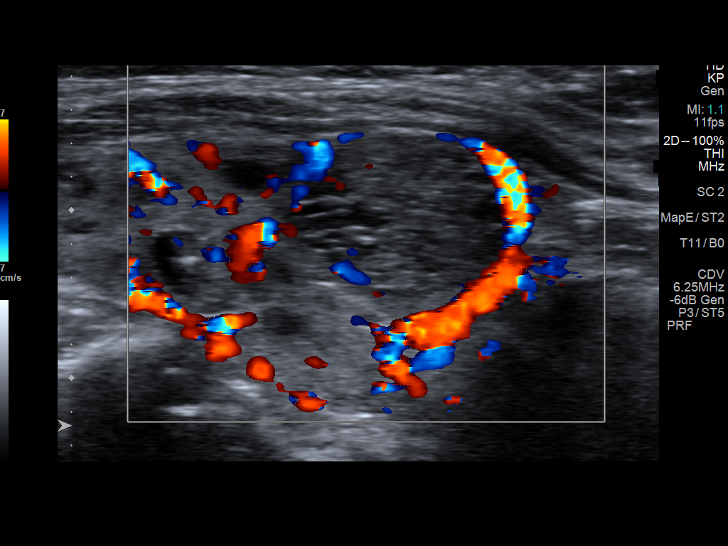
[im 29/69]
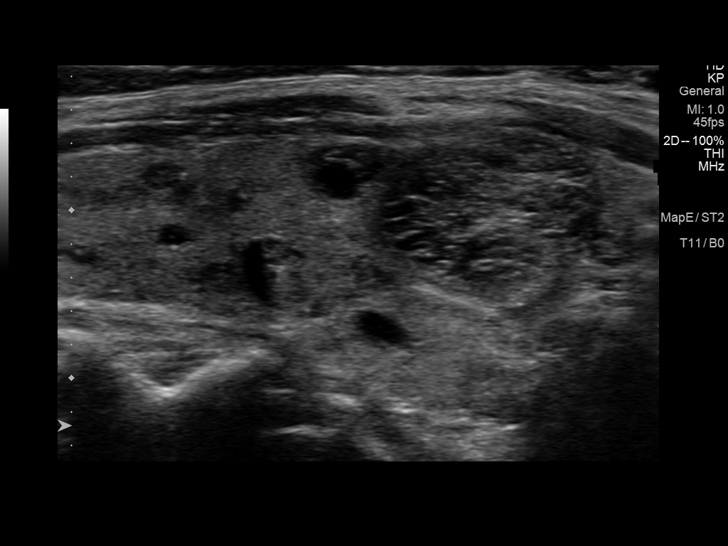
[im 35/69]
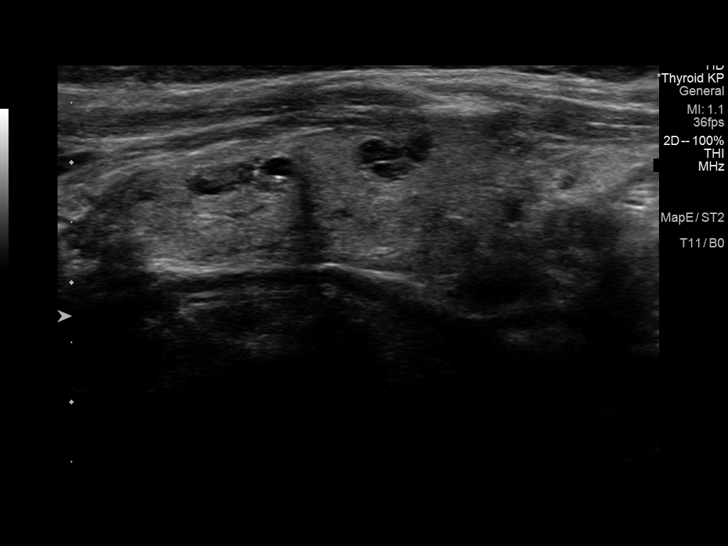
[im 40/69]
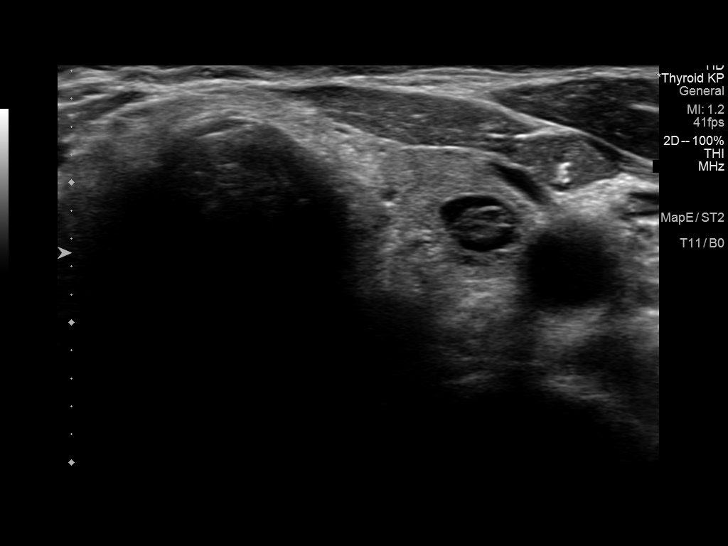
[im 46/69]
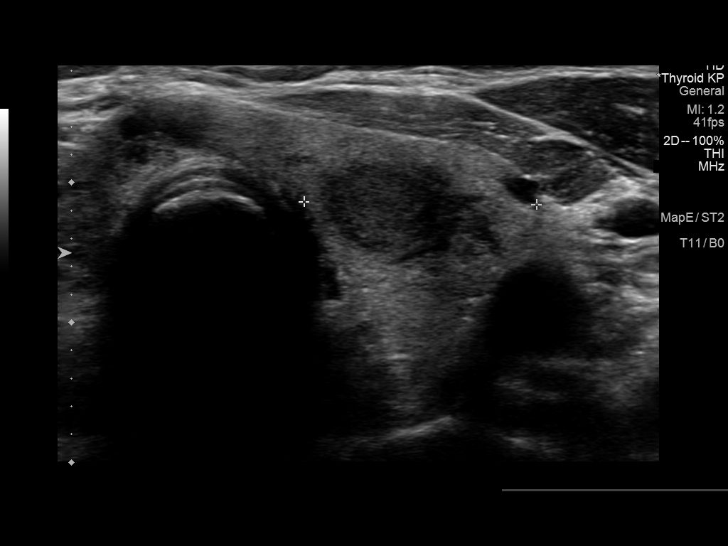
[im 52/69]
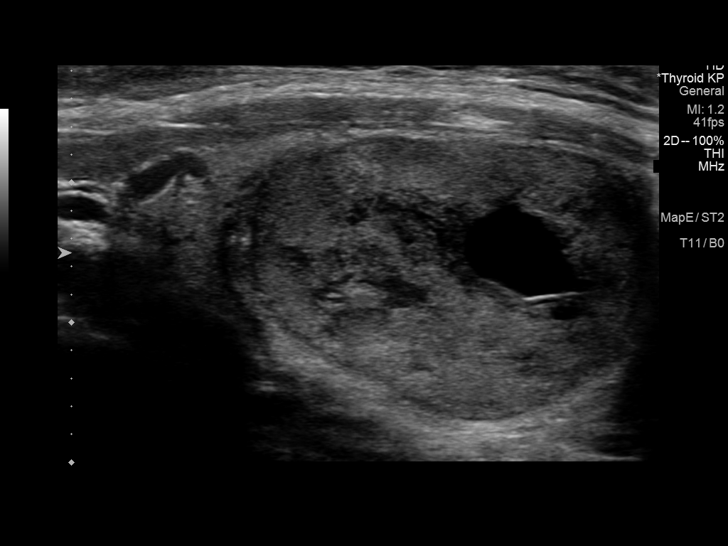
[im 57/69]
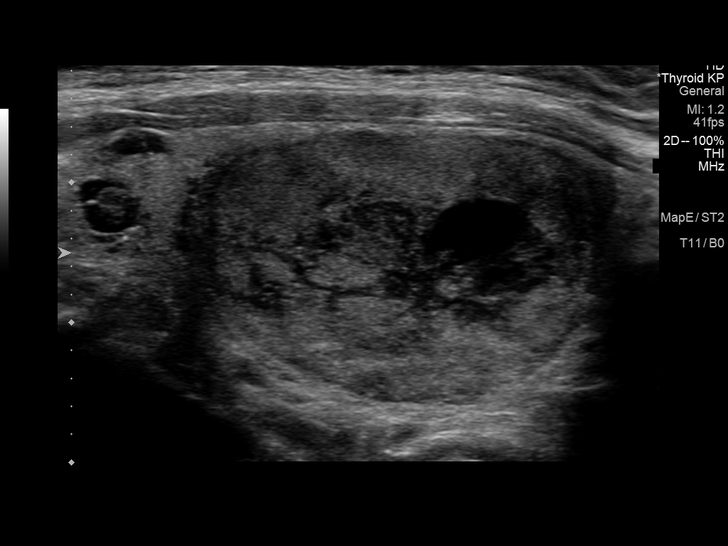
[im 63/69]
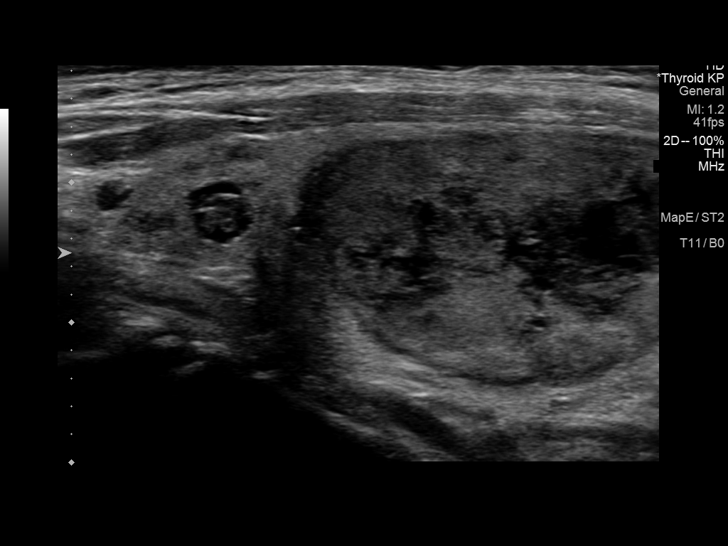
[im 69/69]
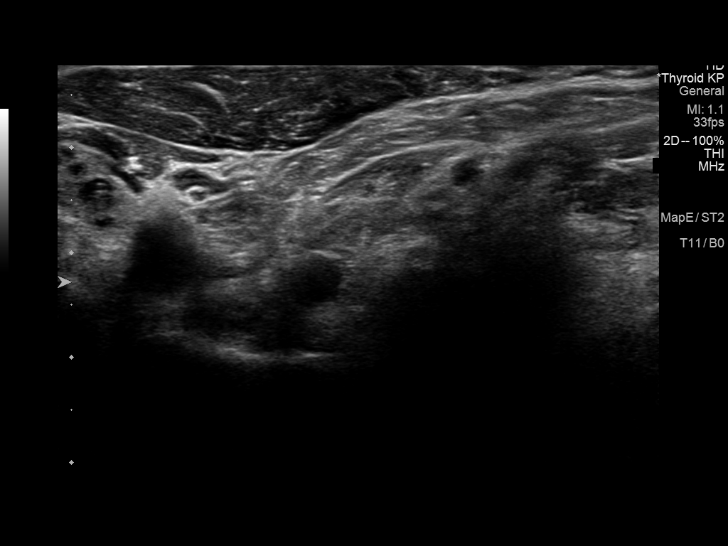

[13 of 25 positions shown; findings below may reference images not displayed]

FINDINGS: Parenchymal Echotexture: Moderately heterogenous

Isthmus: 0.5 cm thickness, previously

Right lobe: 4.3 x 1.6 x 1.5 cm, previously 3.8 x 1.7 x

Left lobe: 4.8 x 2.1 x 1.7 cm, previously 4.9 x 1.8 x

_________________________________________________________

Estimated total number of nodules >/= 1 cm: 2

Number of spongiform nodules >/=  2 cm not described below (TR1): 0

Number of mixed cystic and solid nodules >/= 1.5 cm not described
below (TR2): 0

_________________________________________________________

0.9 cm complex cyst, isthmus, previously 1.2 cm; This nodule does
NOT meet TI-RADS criteria for biopsy or dedicated follow-up.

0.7 cm complex cyst, mid right, stable; This nodule does NOT meet
TI-RADS criteria for biopsy or dedicated follow-up.

1.4 cm spongiform inferior right nodule previously 1.3; This nodule
does NOT meet TI-RADS criteria for biopsy or dedicated follow-up

0.6 cm complex cyst, superior left; This nodule does NOT meet
TI-RADS criteria for biopsy or dedicated follow-up.

Nodule # 5:

Prior biopsy: No

Location: Left; Inferior

Maximum size: 2.9 cm; Other 2 dimensions: 2.4 x 1.9 cm, previously,
2.6 x 2.5 x 1.9 cm

Composition: mixed cystic and solid (1)

Echogenicity: hypoechoic (2)

Shape: not taller-than-wide (0)

Margins: smooth (0)

Echogenic foci: none (0)

ACR TI-RADS total points: 3.

ACR TI-RADS risk category:  TR3 (3 points).

Significant change in size (>/= 20% in two dimensions and minimal
increase of 2 mm): No

Change in features: Yes

Change in ACR TI-RADS risk category: Yes

ACR TI-RADS recommendations:

**Given size (>/= 2.5 cm) and appearance, fine needle aspiration of
this mildly suspicious nodule should be considered based on TI-RADS
criteria.

_________________________________________________________
IMPRESSION: 1. Normal-sized thyroid with bilateral nodules.
2. Recommend FNA biopsy of 2.9 cm TR 3 inferior left nodule.

The above is in keeping with the ACR TI-RADS recommendations - [HOSPITAL] 1731;[DATE].

## 2021-05-09 ENCOUNTER — Encounter: Payer: Self-pay | Admitting: Family Medicine

## 2021-05-09 ENCOUNTER — Ambulatory Visit (INDEPENDENT_AMBULATORY_CARE_PROVIDER_SITE_OTHER): Payer: PPO | Admitting: Family Medicine

## 2021-05-09 VITALS — BP 154/60 | HR 71 | Resp 16 | Ht 63.0 in

## 2021-05-09 DIAGNOSIS — I10 Essential (primary) hypertension: Secondary | ICD-10-CM | POA: Diagnosis not present

## 2021-05-09 MED ORDER — LISINOPRIL-HYDROCHLOROTHIAZIDE 20-25 MG PO TABS: 1.0000 | ORAL_TABLET | Freq: Every day | ORAL | 1 refills | Status: DC

## 2021-05-09 NOTE — Progress Notes (Signed)
ACUTE VISIT Chief Complaint  Patient presents with   Hypertension   HPI: Ms.Reneisha Salem Caster Peckham is a 81 y.o. female, who is here today complaining of elevated BP. She was last seen on 05/04/2021 for her CPE, noted BP on mildly low, 102/70;so she was recommended to monitor BP at home. BP's 140/70,153/89,and 172/92 . Currently she is on amlodipine 10 mg daily and triamterene-HCTZ 37.5-25 mg daily, she takes both medications in the morning.  Hypertension This is a chronic problem. The current episode started in the past 7 days. The problem is uncontrolled. Pertinent negatives include no anxiety, blurred vision, chest pain, headaches, malaise/fatigue, neck pain, orthopnea, palpitations, peripheral edema, PND, shortness of breath or sweats. There are no associated agents to hypertension. Risk factors for coronary artery disease include dyslipidemia. Past treatments include beta blockers, diuretics and calcium channel blockers. There are no compliance problems.  There is no history of chronic renal disease, renovascular disease or sleep apnea.  Lab Results  Component Value Date   CREATININE 0.96 05/04/2021   BUN 20 05/04/2021   NA 137 05/04/2021   K 3.7 05/04/2021   CL 99 05/04/2021   CO2 31 05/04/2021   Negative for dietary changes or stressful events. No new dietary/herb supplements. She has not taken NSAID's. In general she follows a low-salt diet, occasional frozen foods.  Review of Systems  Constitutional:  Negative for malaise/fatigue.  Eyes:  Negative for blurred vision.  Respiratory:  Negative for shortness of breath.   Cardiovascular:  Negative for chest pain, palpitations, orthopnea and PND.  Gastrointestinal:  Negative for abdominal pain, nausea and vomiting.  Endocrine: Negative for cold intolerance and heat intolerance.  Musculoskeletal:  Negative for gait problem and neck pain.  Neurological:  Negative for syncope, facial asymmetry, weakness and headaches.   Psychiatric/Behavioral:  Negative for confusion.   Rest see pertinent positives and negatives per HPI.  Current Outpatient Medications on File Prior to Visit  Medication Sig Dispense Refill   amLODipine (NORVASC) 10 MG tablet TAKE 1 TABLET BY MOUTH EVERY DAY 90 tablet 3   aspirin EC 81 MG tablet Take 81 mg by mouth daily.     docusate sodium (COLACE) 100 MG capsule Take 100 mg by mouth as needed for mild constipation.     MAGNESIUM OXIDE PO Take 250 mg by mouth daily.      oxybutynin (DITROPAN-XL) 5 MG 24 hr tablet Take 5 mg by mouth every morning.     simvastatin (ZOCOR) 10 MG tablet TAKE 1 TABLET BY MOUTH EVERYDAY AT BEDTIME 90 tablet 3   No current facility-administered medications on file prior to visit.     Past Medical History:  Diagnosis Date   High cholesterol    Hypertension    Osteoarthritis    Raynaud disease    Allergies  Allergen Reactions   Prednisone Other (See Comments)    Dehydration, amnesia    Ibuprofen Nausea Only and Other (See Comments)    Nausea & headaches    Social History   Socioeconomic History   Marital status: Widowed    Spouse name: Not on file   Number of children: Not on file   Years of education: Not on file   Highest education level: Not on file  Occupational History   Not on file  Tobacco Use   Smoking status: Never   Smokeless tobacco: Never  Vaping Use   Vaping Use: Never used  Substance and Sexual Activity   Alcohol use: Yes  Comment: social   Drug use: No   Sexual activity: Not Currently  Other Topics Concern   Not on file  Social History Narrative   Not on file   Social Determinants of Health   Financial Resource Strain: Low Risk    Difficulty of Paying Living Expenses: Not hard at all  Food Insecurity: No Food Insecurity   Worried About Charity fundraiser in the Last Year: Never true   [place] in the Last Year: Never true  Transportation Needs: No Transportation Needs   Lack of Transportation  (Medical): No   Lack of Transportation (Non-Medical): No  Physical Activity: Sufficiently Active   Days of Exercise per Week: 7 days   Minutes of Exercise per Session: 90 min  Stress: No Stress Concern Present   Feeling of Stress : Not at all  Social Connections: Not on file   Vitals:   05/09/21 1150  BP: (!) 154/60  Pulse: 71  Resp: 16  SpO2: 98%   Body mass index is 21.08 kg/m.  Physical Exam Vitals and nursing note reviewed.  Constitutional:      General: She is not in acute distress.    Appearance: She is well-developed and normal weight.  HENT:     Head: Normocephalic and atraumatic.  Eyes:     Conjunctiva/sclera: Conjunctivae normal.  Cardiovascular:     Rate and Rhythm: Normal rate and regular rhythm.     Pulses:          Dorsalis pedis pulses are 2+ on the right side and 2+ on the left side.     Heart sounds: No murmur heard. Pulmonary:     Effort: Pulmonary effort is normal. No respiratory distress.     Breath sounds: Normal breath sounds.  Abdominal:     Palpations: Abdomen is soft. There is no hepatomegaly or mass.     Tenderness: There is no abdominal tenderness.  Lymphadenopathy:     Cervical: No cervical adenopathy.  Skin:    General: Skin is warm.     Findings: No erythema or rash.  Neurological:     General: No focal deficit present.     Mental Status: She is alert and oriented to person, place, and time.     Cranial Nerves: No cranial nerve deficit.     Gait: Gait normal.  Psychiatric:     Comments: Well groomed, good eye contact.   ASSESSMENT AND PLAN:  Ms.Yani was seen today for hypertension.  Diagnoses and all orders for this visit: -     lisinopril-hydrochlorothiazide (ZESTORETIC) 20-25 MG tablet; Take 1 tablet by mouth daily. -     Basic metabolic panel; Future  Essential hypertension BP is not well controlled. BP with her BP monitor was 146/75. We discussed a few options. She was on Atenolol in the past , discontinued because  it caused bradycardia. She agrees with stopping Triamterene and start Lisinopril 20 mg. No changes in HCTZ dose. Instructed to take Amlodipine 10 mg  at bedtime. Continue low salt diet. Monitor bp bid. We discussed possible complications of elevated BP. Clearly instructed about warning signs. BMP in 7-10 days and f/u in 6 weeks, before if needed.  Return in about 6 weeks (around 06/20/2021) for Lab in 7 days.  Corinne Goucher G. Martinique, MD  [facility] [place] office.

## 2021-05-09 NOTE — Patient Instructions (Signed)
A few things to remember from today's visit:   Essential hypertension - Plan: lisinopril-hydrochlorothiazide (ZESTORETIC) 20-25 MG tablet  If you need refills please call your pharmacy. Do not use My Chart to request refills or for acute issues that need immediate attention.   Take Amlodipine at bedtime. Today we changed triamterene for Lisinopril, no changes in hydrochlorothiazide. Monitor blood pressure morning and night. Labs in 7-10 days.  Please be sure medication list is accurate. If a new problem present, please set up appointment sooner than planned today.

## 2021-05-09 NOTE — Assessment & Plan Note (Addendum)
BP is not well controlled. BP with her BP monitor was 146/75. We discussed a few options. She was on Atenolol in the past , discontinued because it caused bradycardia. She agrees with stopping Triamterene and start Lisinopril 20 mg. No changes in HCTZ dose. Instructed to take Amlodipine 10 mg  at bedtime. Continue low salt diet. Monitor bp bid. We discussed possible complications of elevated BP. Clearly instructed about warning signs. BMP in 7-10 days and f/u in 6 weeks, before if needed.

## 2021-05-19 ENCOUNTER — Other Ambulatory Visit: Payer: PPO

## 2021-05-19 ENCOUNTER — Encounter: Payer: Self-pay | Admitting: Family Medicine

## 2021-05-20 ENCOUNTER — Other Ambulatory Visit (INDEPENDENT_AMBULATORY_CARE_PROVIDER_SITE_OTHER): Payer: PPO

## 2021-05-20 ENCOUNTER — Other Ambulatory Visit: Payer: Self-pay

## 2021-05-20 DIAGNOSIS — I10 Essential (primary) hypertension: Secondary | ICD-10-CM | POA: Diagnosis not present

## 2021-05-20 LAB — BASIC METABOLIC PANEL
BUN: 22 mg/dL (ref 6–23)
CO2: 34 mEq/L — ABNORMAL HIGH (ref 19–32)
Calcium: 9.8 mg/dL (ref 8.4–10.5)
Chloride: 100 mEq/L (ref 96–112)
Creatinine, Ser: 0.95 mg/dL (ref 0.40–1.20)
GFR: 56.24 mL/min — ABNORMAL LOW (ref >60.00)
Glucose, Bld: 101 mg/dL — ABNORMAL HIGH (ref 70–99)
Potassium: 3.5 mEq/L (ref 3.5–5.1)
Sodium: 139 mEq/L (ref 135–145)

## 2021-05-23 ENCOUNTER — Encounter: Payer: Self-pay | Admitting: Family Medicine

## 2021-05-25 DIAGNOSIS — L820 Inflamed seborrheic keratosis: Secondary | ICD-10-CM | POA: Diagnosis not present

## 2021-05-25 DIAGNOSIS — Z789 Other specified health status: Secondary | ICD-10-CM | POA: Diagnosis not present

## 2021-05-25 DIAGNOSIS — C44321 Squamous cell carcinoma of skin of nose: Secondary | ICD-10-CM | POA: Diagnosis not present

## 2021-05-25 DIAGNOSIS — R208 Other disturbances of skin sensation: Secondary | ICD-10-CM | POA: Diagnosis not present

## 2021-05-25 DIAGNOSIS — L538 Other specified erythematous conditions: Secondary | ICD-10-CM | POA: Diagnosis not present

## 2021-05-25 DIAGNOSIS — D485 Neoplasm of uncertain behavior of skin: Secondary | ICD-10-CM | POA: Diagnosis not present

## 2021-05-31 ENCOUNTER — Other Ambulatory Visit: Payer: Self-pay | Admitting: Family Medicine

## 2021-05-31 DIAGNOSIS — I10 Essential (primary) hypertension: Secondary | ICD-10-CM

## 2021-06-15 NOTE — Progress Notes (Signed)
? ?HPI: ?[name] is a 81 y.o. female, who is here today for follow up. ?She was last seen on 05/09/21. ?Last visit triamterene was discontinued and Lisinopril 20 mg started because BP was running high. ?She is also on HCTZ 25 mg and Amlodipine 10 mg daily. ?She has tolerated medication well. ?Mildly abnormal e GFR, stable when re-checked after starting Lisinopril. ? ?BP readings at home:Most BP's 110's-120's/60-70's. Some SBP's low 100's and 130's-140's. ?Negative for unusual or severe headache, visual changes, exertional chest pain, dyspnea,  focal weakness, or edema. ?Occasional dizziness when bending down, last < 10 seconds. ? ?Lab Results  ?Component Value Date  ? CREATININE 0.95 05/20/2021  ? BUN 22 05/20/2021  ? NA 139 05/20/2021  ? K 3.5 05/20/2021  ? CL 100 05/20/2021  ? CO2 34 (H) 05/20/2021  ? ?For joint pain in general she takes Aleve occasionally. ? ?She is planning on seeing a new provider, urologist. She has seen her gyn for cystocele and overactive bladder, she is on Oxybutynin XL 5 mg daily but it is not helping much. She has sensation of vaginal bulging with valsalva and prolonged standing. She is interested in discussing other treatment options. ?She has had problem for 5 years. ? ?Oxybutynin has also aggravated constipation. ?Has increased dose of benefiber. She takes Colace. ?Negative for abdominal pain, nausea,or vomiting. ? ?She has seen her dermatologist since her last visit, s/p nose lesion bx and  pending moss procedure in 07/2021 ? ?Review of Systems  ?Constitutional:  Negative for activity change, appetite change and fever.  ?HENT:  Negative for mouth sores and nosebleeds.   ?Respiratory:  Negative for cough and wheezing.   ?Genitourinary:  Negative for decreased urine volume, dysuria and hematuria.  ?Neurological:  Negative for syncope and headaches.  ?Rest see pertinent positives and negatives per HPI. ? ?Current Outpatient Medications on File Prior to Visit  ?Medication Sig  Dispense Refill  ? amLODipine (NORVASC) 10 MG tablet TAKE 1 TABLET BY MOUTH EVERY DAY 90 tablet 3  ? Ascorbic Acid (VITAMIN C) 250 MG CHEW Chew 1 tablet by mouth daily.    ? aspirin EC 81 MG tablet Take 81 mg by mouth daily.    ? Calcium-Phosphorus-Vitamin D (CALCIUM GUMMIES) 250-100-500 MG-MG-UNIT CHEW Chew 1 tablet by mouth daily.    ? Cyanocobalamin (VITAMIN B-12) 5000 MCG TBDP Take 1 tablet by mouth daily.    ? docusate sodium (COLACE) 100 MG capsule Take 100 mg by mouth as needed for mild constipation.    ? MAGNESIUM OXIDE PO Take 250 mg by mouth daily.     ? oxybutynin (DITROPAN-XL) 5 MG 24 hr tablet Take 5 mg by mouth every morning.    ? simvastatin (ZOCOR) 10 MG tablet TAKE 1 TABLET BY MOUTH EVERYDAY AT BEDTIME 90 tablet 3  ? ?No current facility-administered medications on file prior to visit.  ? ?Past Medical History:  ?Diagnosis Date  ? High cholesterol   ? Hypertension   ? Osteoarthritis   ? Raynaud disease   ? ?Allergies  ?Allergen Reactions  ? Prednisone Other (See Comments)  ?  Dehydration, amnesia ?  ? Ibuprofen Nausea Only and Other (See Comments)  ?  Nausea & headaches  ? ?Social History  ? ?Socioeconomic History  ? Marital status: Widowed  ?  Spouse name: Not on file  ? Number of children: Not on file  ? Years of education: Not on file  ? Highest education level: Master's degree (e.g., MA, MS,  MEng, MEd, MSW, [facility])  ?Occupational History  ? Not on file  ?Tobacco Use  ? Smoking status: Never  ? Smokeless tobacco: Never  ?Vaping Use  ? Vaping Use: Never used  ?Substance and Sexual Activity  ? Alcohol use: Yes  ?  Comment: social  ? Drug use: No  ? Sexual activity: Not Currently  ?Other Topics Concern  ? Not on file  ?Social History Narrative  ? Not on file  ? ?Social Determinants of Health  ? ?Financial Resource Strain: Low Risk   ? Difficulty of Paying Living Expenses: Not hard at all  ?Food Insecurity: No Food Insecurity  ? Worried About Charity fundraiser in the Last Year: Never true  ? Ran Out  of Food in the Last Year: Never true  ?Transportation Needs: No Transportation Needs  ? Lack of Transportation (Medical): No  ? Lack of Transportation (Non-Medical): No  ?Physical Activity: Sufficiently Active  ? Days of Exercise per Week: 7 days  ? Minutes of Exercise per Session: 100 min  ?Stress: No Stress Concern Present  ? Feeling of Stress : Not at all  ?Social Connections: Unknown  ? Frequency of Communication with Friends and Family: More than three times a week  ? Frequency of Social Gatherings with Friends and Family: Not on file  ? Attends Religious Services: Patient refused  ? Active Member of Clubs or Organizations: No  ? Attends Archivist Meetings: Not on file  ? Marital Status: Widowed  ? ?Vitals:  ? 06/17/21 0848  ?BP: 120/70  ?Pulse: 68  ?Resp: 16  ? ?Body mass index is 20.19 kg/m?. ? ?Physical Exam ?Vitals and nursing note reviewed.  ?Constitutional:   ?   General: She is not in acute distress. ?   Appearance: She is well-developed.  ?HENT:  ?   Head: Normocephalic and atraumatic.  ?   Mouth/Throat:  ?   Mouth: Mucous membranes are moist.  ?   Pharynx: Oropharynx is clear.  ?Eyes:  ?   Conjunctiva/sclera: Conjunctivae normal.  ?Cardiovascular:  ?   Rate and Rhythm: Normal rate and regular rhythm.  ?   Pulses:     ?     Posterior tibial pulses are 2+ on the right side and 2+ on the left side.  ?   Heart sounds: No murmur heard. ?Pulmonary:  ?   Effort: Pulmonary effort is normal. No respiratory distress.  ?   Breath sounds: Normal breath sounds.  ?Abdominal:  ?   Palpations: Abdomen is soft. There is no hepatomegaly or mass.  ?   Tenderness: There is no abdominal tenderness.  ?Lymphadenopathy:  ?   Cervical: No cervical adenopathy.  ?Skin: ?   General: Skin is warm.  ?   Findings: No erythema or rash.  ?Neurological:  ?   General: No focal deficit present.  ?   Mental Status: She is alert and oriented to person, place, and time.  ?   Cranial Nerves: No cranial nerve deficit.  ?   Gait:  Gait normal.  ?Psychiatric:  ?   Comments: Well groomed, good eye contact.  ? ?ASSESSMENT AND PLAN: ? ?Molly Wilcox was seen today for follow-up. ? ?Diagnoses and all orders for this visit: ? ?Abnormal renal function test ?Stable, e GFR 56 (55 05/04/21). ?Continue adequate hydration and low salt diet. ?Caution with NSAID's. ?Will check renal function next visit. ? ?Constipation ?We discussed some side effects of Oxybutynin. ?Continue adequate fiber and fluid intake. ?Benafiber 1  tsp bid. ?Recommend trying Miralax daily at bedtime prn. ? ?Essential hypertension ?BP adequately controlled. ?Continue Amlodipine 10 mg and lisinopril-HCTZ 20-25 mg daily. ?Continue low salt diet and monitoring BP regularly. ? ?Return in about 5 months (around 11/17/2021). ? ?Umeka Wrench G. Martinique, MD ? ?[place]. ?[place] office. ? ?

## 2021-06-17 ENCOUNTER — Ambulatory Visit (INDEPENDENT_AMBULATORY_CARE_PROVIDER_SITE_OTHER): Payer: PPO | Admitting: Family Medicine

## 2021-06-17 ENCOUNTER — Encounter: Payer: Self-pay | Admitting: Family Medicine

## 2021-06-17 VITALS — BP 120/70 | HR 68 | Resp 16 | Ht 63.0 in | Wt 114.0 lb

## 2021-06-17 DIAGNOSIS — K5900 Constipation, unspecified: Secondary | ICD-10-CM | POA: Diagnosis not present

## 2021-06-17 DIAGNOSIS — R944 Abnormal results of kidney function studies: Secondary | ICD-10-CM

## 2021-06-17 DIAGNOSIS — I10 Essential (primary) hypertension: Secondary | ICD-10-CM | POA: Diagnosis not present

## 2021-06-17 NOTE — Assessment & Plan Note (Addendum)
We discussed some side effects of Oxybutynin. ?Continue adequate fiber and fluid intake. ?Benafiber 1 tsp bid. ?Recommend trying Miralax daily at bedtime prn. ?

## 2021-06-17 NOTE — Assessment & Plan Note (Addendum)
BP adequately controlled. ?Continue Amlodipine 10 mg and lisinopril-HCTZ 20-25 mg daily. ?Continue low salt diet and monitoring BP regularly. ?

## 2021-06-17 NOTE — Patient Instructions (Addendum)
A few things to remember from today's visit: ? ?Essential hypertension ? ?Constipation, unspecified constipation type ? ?Abnormal renal function test ? ?If you need refills please call your pharmacy. ?Do not use My Chart to request refills or for acute issues that need immediate attention. ?  ?Stop Colace and start Miralax at night. ?Continue Benafiber and adequate hydration. ?Continue monitoring blood pressure. ? ?Please be sure medication list is accurate. ?If a new problem present, please set up appointment sooner than planned today. ? ?

## 2021-06-18 ENCOUNTER — Encounter: Payer: Self-pay | Admitting: Family Medicine

## 2021-06-18 MED ORDER — LISINOPRIL-HYDROCHLOROTHIAZIDE 20-25 MG PO TABS: 1.0000 | ORAL_TABLET | Freq: Every day | ORAL | 1 refills | Status: DC

## 2021-07-06 DIAGNOSIS — N3941 Urge incontinence: Secondary | ICD-10-CM | POA: Diagnosis not present

## 2021-07-06 DIAGNOSIS — R8271 Bacteriuria: Secondary | ICD-10-CM | POA: Diagnosis not present

## 2021-07-06 DIAGNOSIS — N8111 Cystocele, midline: Secondary | ICD-10-CM | POA: Diagnosis not present

## 2021-07-19 DIAGNOSIS — D0439 Carcinoma in situ of skin of other parts of face: Secondary | ICD-10-CM | POA: Diagnosis not present

## 2021-08-04 DIAGNOSIS — N8111 Cystocele, midline: Secondary | ICD-10-CM | POA: Diagnosis not present

## 2021-08-04 DIAGNOSIS — R351 Nocturia: Secondary | ICD-10-CM | POA: Diagnosis not present

## 2021-08-04 DIAGNOSIS — N3941 Urge incontinence: Secondary | ICD-10-CM | POA: Diagnosis not present

## 2021-08-16 DIAGNOSIS — H25813 Combined forms of age-related cataract, bilateral: Secondary | ICD-10-CM | POA: Diagnosis not present

## 2021-08-23 DIAGNOSIS — L905 Scar conditions and fibrosis of skin: Secondary | ICD-10-CM | POA: Diagnosis not present

## 2021-09-09 DIAGNOSIS — N8111 Cystocele, midline: Secondary | ICD-10-CM | POA: Diagnosis not present

## 2021-09-09 DIAGNOSIS — N3941 Urge incontinence: Secondary | ICD-10-CM | POA: Diagnosis not present

## 2021-09-26 ENCOUNTER — Encounter: Payer: Self-pay | Admitting: Family Medicine

## 2021-09-26 ENCOUNTER — Ambulatory Visit (INDEPENDENT_AMBULATORY_CARE_PROVIDER_SITE_OTHER): Payer: PPO | Admitting: Family Medicine

## 2021-09-26 VITALS — BP 130/60 | HR 95 | Temp 98.4°F | Ht 63.0 in | Wt 120.0 lb

## 2021-09-26 DIAGNOSIS — H6123 Impacted cerumen, bilateral: Secondary | ICD-10-CM

## 2021-10-31 NOTE — Progress Notes (Addendum)
[name] is a 81 y.o.female, who is here today for follow up.  Last follow up visit: 05/04/21 Hypertension:  Medications: Amlodipine 10 mg daily and Lisinopril-Hydrochlorothiazide 20-25 mg daily. BP readings at home:Most of BP's 120-130's/60-70's, occasionally SBP low 100's-110's. Side effects:None. Negative for unusual or severe headache, visual changes, exertional chest pain, dyspnea,  focal weakness, or edema. Last couple eGFR mildly abnormal, 55 and 56. Negative for foam in urina or decreased urine outour.  Lab Results  Component Value Date   CREATININE 0.95 05/20/2021   BUN 22 05/20/2021   NA 139 05/20/2021   K 3.5 05/20/2021   CL 100 05/20/2021   CO2 34 (H) 05/20/2021   An episode of nose bleed from right nostril, no trigger factor identified. She applied pressure for about 5 min. Negative for gum bleeding,increased bruising, gross hematuria,melena,or blood in stool.  Multinodular goiter: According to patient, she does not need to continue following with endocrinologist, she has been released. She has not noted gross or dysphagia. Thyroid US 09/24/19:Similar appearance of heterogeneous and multinodular thyroid.  S/P thyroid Bx. No further specific follow-up would be indicated.  Lab Results  Component Value Date   TSH 1.74 09/22/2020   Osteoporosis: Prolia cost went up, difficult to afford. Last DEXA 02/2020. She was wondering about trying another injectable medication, Forteo.  Bilateral tinnitus, left>right. Problem noted after ear lavage about 3 to 4 weeks ago. It is mild, it does not interfere with sleep. It is more noticeable at night when is quiet. Negative for changes in hearing, dizziness, or ear ache.  LE hyperpigmentation changes.  Problem has been going on for a few years. She follows with dermatologist, addressed last visit, no further work-up was recommended. She has not noted LE erythema or pain.  Review of Systems  Constitutional:   Negative for activity change, appetite change and fever.  HENT:  Positive for nosebleeds. Negative for mouth sores and sore throat.   Respiratory:  Negative for cough and wheezing.   Gastrointestinal:  Negative for abdominal pain, nausea and vomiting.       Negative for changes in bowel habits.  Skin:  Negative for rash.  Neurological:  Negative for syncope and facial asymmetry.  Rest see pertinent positives and negatives per HPI.  Current Outpatient Medications on File Prior to Visit  Medication Sig Dispense Refill   amLODipine (NORVASC) 10 MG tablet TAKE 1 TABLET BY MOUTH EVERY DAY 90 tablet 3   Ascorbic Acid (VITAMIN C) 250 MG CHEW Chew 1 tablet by mouth daily.     aspirin EC 81 MG tablet Take 81 mg by mouth daily.     Calcium-Phosphorus-Vitamin D (CALCIUM GUMMIES) 250-100-500 MG-MG-UNIT CHEW Chew 1 tablet by mouth daily.     Cyanocobalamin (VITAMIN B-12) 5000 MCG TBDP Take 1 tablet by mouth daily.     lisinopril-hydrochlorothiazide (ZESTORETIC) 20-25 MG tablet Take 1 tablet by mouth daily. 90 tablet 1   MAGNESIUM OXIDE PO Take 250 mg by mouth daily.      MYRBETRIQ 50 MG TB24 tablet Take 50 mg by mouth daily.     Polyethylene Glycol 3350 (MIRALAX PO) Take by mouth.     simvastatin (ZOCOR) 10 MG tablet TAKE 1 TABLET BY MOUTH EVERYDAY AT BEDTIME 90 tablet 3   No current facility-administered medications on file prior to visit.     Past Medical History:  Diagnosis Date   High cholesterol    Hypertension    Osteoarthritis    Raynaud disease  Allergies  Allergen Reactions   Prednisone Other (See Comments)    Dehydration, amnesia    Ibuprofen Nausea Only and Other (See Comments)    Nausea & headaches   Social History   Socioeconomic History   Marital status: Widowed    Spouse name: Not on file   Number of children: Not on file   Years of education: Not on file   Highest education level: Master's degree (e.g., MA, MS, MEng, MEd, MSW, MBA)  Occupational History   Not  on file  Tobacco Use   Smoking status: Never   Smokeless tobacco: Never  Vaping Use   Vaping Use: Never used  Substance and Sexual Activity   Alcohol use: Yes    Comment: social   Drug use: No   Sexual activity: Not Currently  Other Topics Concern   Not on file  Social History Narrative   Not on file   Social Determinants of Health   Financial Resource Strain: Low Risk  (06/16/2021)   Overall Financial Resource Strain (CARDIA)    Difficulty of Paying Living Expenses: Not hard at all  Food Insecurity: No Food Insecurity (06/16/2021)   Hunger Vital Sign    Worried About Running Out of Food in the Last Year: Never true    [place] in the Last Year: Never true  Transportation Needs: No Transportation Needs (06/16/2021)   PRAPARE - Hydrologist (Medical): No    Lack of Transportation (Non-Medical): No  Physical Activity: Sufficiently Active (06/16/2021)   Exercise Vital Sign    Days of Exercise per Week: 7 days    Minutes of Exercise per Session: 100 min  Stress: No Stress Concern Present (06/16/2021)   Reform    Feeling of Stress : Not at all  Social Connections: Unknown (06/16/2021)   Social Connection and Isolation Panel [NHANES]    Frequency of Communication with Friends and Family: More than three times a week    Frequency of Social Gatherings with Friends and Family: Not on file    Attends Religious Services: Patient refused    Active Member of Clubs or Organizations: No    Attends Archivist Meetings: Not on file    Marital Status: Widowed    Vitals:   11/01/21 0922  BP: 120/64  Pulse: 74  Resp: 12  SpO2: 98%   Body mass index is 20.75 kg/m.  Physical Exam Vitals and nursing note reviewed.  Constitutional:      General: She is not in acute distress.    Appearance: She is well-developed.  HENT:     Head: Normocephalic and atraumatic.     Nose: No  mucosal edema or rhinorrhea.     Right Nostril: No epistaxis.     Right Turbinates: Not enlarged.     Left Turbinates: Not enlarged.     Mouth/Throat:     Mouth: Mucous membranes are moist.     Pharynx: Oropharynx is clear.  Eyes:     Conjunctiva/sclera: Conjunctivae normal.  Cardiovascular:     Rate and Rhythm: Normal rate and regular rhythm.     Pulses:          Dorsalis pedis pulses are 2+ on the right side and 2+ on the left side.     Heart sounds: No murmur heard.    Comments: Varicose veins LE, bilateral. Pulmonary:     Effort: Pulmonary effort is normal. No  respiratory distress.     Breath sounds: Normal breath sounds.  Abdominal:     Palpations: Abdomen is soft. There is no hepatomegaly or mass.     Tenderness: There is no abdominal tenderness.  Lymphadenopathy:     Cervical: No cervical adenopathy.  Skin:    General: Skin is warm.     Findings: No erythema or rash.     Comments: Hyperpigmentation skin changes aroun ankle and distal pretibial areas.  Neurological:     General: No focal deficit present.     Mental Status: She is alert and oriented to person, place, and time.     Cranial Nerves: No cranial nerve deficit.     Gait: Gait normal.  Psychiatric:     Comments: Well groomed, good eye contact.   ASSESSMENT AND PLAN:   [name] was seen today for follow-up.  Diagnoses and all orders for this visit: Orders Placed This Encounter  Procedures   Basic metabolic panel   Microalbumin / creatinine urine ratio   TSH   Lab Results  Component Value Date   TSH 1.09 11/01/2021   Lab Results  Component Value Date   CREATININE 0.87 11/01/2021   BUN 26 (H) 11/01/2021   NA 138 11/01/2021   K 3.7 11/01/2021   CL 100 11/01/2021   CO2 27 11/01/2021   Lab Results  Component Value Date   MICROALBUR <0.7 11/01/2021   Anterior epistaxis One episode. We discussed possible etiologies. Hx does not suggest a serious process. If dry nasal mucosa she can apply an  small amount of vaseline or [place].  Tinnitus of both ears Hx and examination are not suggestive of a more serious process. Monitor for new symptoms.  Abnormal renal function test -     Microalbumin / creatinine urine ratio -     Basic metabolic panel  Hyperpigmentation of skin, postinflammatory Could be related to varicose veins. Reassured.  Multinodular goiter Problem has been stable. She is no longer following with endocrinologist. We will continue monitoring TSH annually.  Essential hypertension BP adequately controlled. Continue amlodipine 10 mg and lisinopril-HCTZ 20-25 mg daily. Continue monitoring BP regularly and following low-salt diet. Eye exam is current.  Osteoporosis Prolia cost went up. We discussed other options, including annual Reclast infusion. She was asking about Forteo, we discussed some side effects. Continue adequate calcium and vitamin D supplementation as well as fall prevention. Continue regular, low impact exercise.  I spent a total of 41 minutes in both face to face and non face to face activities for this visit on the date of this encounter. During this time history was obtained and documented, examination was performed, prior labs reviewed, and assessment/plan discussed (Different treatments available for osteoporosis,side effects,and possible etiologies of epistaxis). She was reassured about skin pigmentation changes.  Return in about 27 weeks (around 05/09/2022) for cpe.  Ziair Penson G. [place], MD  [facility]. [place] office.

## 2021-11-01 ENCOUNTER — Encounter: Payer: Self-pay | Admitting: Family Medicine

## 2021-11-01 ENCOUNTER — Ambulatory Visit (INDEPENDENT_AMBULATORY_CARE_PROVIDER_SITE_OTHER): Payer: PPO | Admitting: Family Medicine

## 2021-11-01 VITALS — BP 120/64 | HR 74 | Resp 12 | Ht 63.0 in | Wt 117.1 lb

## 2021-11-01 DIAGNOSIS — E042 Nontoxic multinodular goiter: Secondary | ICD-10-CM

## 2021-11-01 DIAGNOSIS — M816 Localized osteoporosis [Lequesne]: Secondary | ICD-10-CM | POA: Diagnosis not present

## 2021-11-01 DIAGNOSIS — R040 Epistaxis: Secondary | ICD-10-CM | POA: Diagnosis not present

## 2021-11-01 DIAGNOSIS — L810 Postinflammatory hyperpigmentation: Secondary | ICD-10-CM | POA: Diagnosis not present

## 2021-11-01 DIAGNOSIS — H9313 Tinnitus, bilateral: Secondary | ICD-10-CM

## 2021-11-01 DIAGNOSIS — I10 Essential (primary) hypertension: Secondary | ICD-10-CM | POA: Diagnosis not present

## 2021-11-01 DIAGNOSIS — R944 Abnormal results of kidney function studies: Secondary | ICD-10-CM | POA: Diagnosis not present

## 2021-11-01 LAB — BASIC METABOLIC PANEL
BUN: 26 mg/dL — ABNORMAL HIGH (ref 6–23)
CO2: 27 mEq/L (ref 19–32)
Calcium: 9.6 mg/dL (ref 8.4–10.5)
Chloride: 100 mEq/L (ref 96–112)
Creatinine, Ser: 0.87 mg/dL (ref 0.40–1.20)
GFR: 62.3 mL/min (ref >60.00)
Glucose, Bld: 94 mg/dL (ref 70–99)
Potassium: 3.7 mEq/L (ref 3.5–5.1)
Sodium: 138 mEq/L (ref 135–145)

## 2021-11-01 LAB — MICROALBUMIN / CREATININE URINE RATIO
Creatinine,U: 54.5 mg/dL
Microalb Creat Ratio: 1.3 mg/g (ref 0.0–30.0)
Microalb, Ur: <0.7 mg/dL (ref 0.0–1.9)

## 2021-11-01 LAB — TSH: TSH: 1.09 u[IU]/mL (ref 0.35–5.50)

## 2021-11-01 NOTE — Assessment & Plan Note (Signed)
Prolia cost went up. We discussed other options, including annual Reclast infusion. She was asking about Forteo, we discussed some side effects. Continue adequate calcium and vitamin D supplementation as well as fall prevention. Continue regular, low impact exercise.

## 2021-11-01 NOTE — Assessment & Plan Note (Signed)
BP adequately controlled. Continue amlodipine 10 mg and lisinopril-HCTZ 20-25 mg daily. Continue monitoring BP regularly and following low-salt diet. Eye exam is current. 

## 2021-11-01 NOTE — Patient Instructions (Addendum)
A few things to remember from today's visit:   Essential hypertension - Plan: Basic metabolic panel  Anterior epistaxis  Tinnitus of both ears  Abnormal renal function test - Plan: Basic metabolic panel, Microalbumin / creatinine urine ratio  Hyperpigmentation of skin, postinflammatory  Localized osteoporosis without current pathological fracture  Multinodular goiter - Plan: TSH Most likely related to vein disease.  No changes in medications today. We could try reclast annual infusion for osteoporosis.  If you need refills please call your pharmacy. Do not use My Chart to request refills or for acute issues that need immediate attention.  Please be sure medication list is accurate. If a new problem present, please set up appointment sooner than planned today. Tinnitus Tinnitus refers to hearing a sound when there is no actual source for that sound. This is often described as ringing in the ears. However, people with this condition may hear a variety of noises, in one ear or in both ears. The sounds of tinnitus can be soft, loud, or somewhere in between. Tinnitus can last for a few seconds or can be constant for days. It may go away without treatment and come back at various times. When tinnitus is constant or happens often, it can lead to other problems, such as trouble sleeping and trouble concentrating. Almost everyone experiences tinnitus at some point. Tinnitus is not the same as hearing loss. Tinnitus that is long-lasting (chronic) or comes back often (recurs) may require medical attention. What are the causes? The cause of tinnitus is often not known. In some cases, it can result from: Exposure to loud noises from machinery, music, or other sources. An object (foreign body) stuck in the ear. Earwax buildup. Drinking alcohol or caffeine. Taking certain medicines. Age-related hearing loss. It may also be caused by medical conditions such as: Ear or sinus infections. Heart  diseases or high blood pressure. Allergies. Mnire's disease. Thyroid problems. Tumors. A weak, bulging blood vessel (aneurysm) near the ear. What increases the risk? The following factors may make you more likely to develop this condition: Exposure to loud noises. Age. Tinnitus is more likely in older individuals. Using alcohol or tobacco. What are the signs or symptoms? The main symptom of tinnitus is hearing a sound when there is no source for that sound. It may sound like: Buzzing. Sizzling. Ringing. Blowing air. Hissing. Whistling. Other sounds may include: Roaring. Running water. A musical note. Tapping. Humming. Symptoms may affect only one ear (unilateral) or both ears (bilateral). How is this diagnosed? Tinnitus is diagnosed based on your symptoms, your medical history, and a physical exam. Your health care provider may do a thorough hearing test (audiologic exam) if your tinnitus: Is unilateral. Causes hearing difficulties. Lasts 6 months or longer. You may work with a health care provider who specializes in hearing disorders (audiologist). You may be asked questions about your symptoms and how they affect your daily life. You may have other tests done, such as: CT scan. MRI. An imaging test of how blood flows through your blood vessels (angiogram). How is this treated? Treating an underlying medical condition can sometimes make tinnitus go away. If your tinnitus continues, other treatments may include: Therapy and counseling to help you manage the stress of living with tinnitus. Sound generators to mask the tinnitus. These include: Tabletop sound machines that play relaxing sounds to help you fall asleep. Wearable devices that fit in your ear and play sounds or music. Acoustic neural stimulation. This involves using headphones to listen to  music that contains an auditory signal. Over time, listening to this signal may change some pathways in your brain and make  you less sensitive to tinnitus. This treatment is used for very severe cases when no other treatment is working. Using hearing aids or cochlear implants if your tinnitus is related to hearing loss. Hearing aids are worn in the outer ear. Cochlear implants are surgically placed in the inner ear. Follow these instructions at home: Managing symptoms     When possible, avoid being in loud places and being exposed to loud sounds. Wear hearing protection, such as earplugs, when you are exposed to loud noises. Use a white noise machine, a humidifier, or other devices to mask the sound of tinnitus. Practice techniques for reducing stress, such as meditation, yoga, or deep breathing. Work with your health care provider if you need help with managing stress. Sleep with your head slightly raised. This may reduce the impact of tinnitus. General instructions Do not use stimulants, such as nicotine, alcohol, or caffeine. Talk with your health care provider about other stimulants to avoid. Stimulants are substances that can make you feel alert and attentive by increasing certain activities in the body (such as heart rate and blood pressure). These substances may make tinnitus worse. Take over-the-counter and prescription medicines only as told by your health care provider. Try to get plenty of sleep each night. Keep all follow-up visits. This is important. Contact a health care provider if: Your tinnitus continues for 3 weeks or longer without stopping. You develop sudden hearing loss. Your symptoms get worse or do not get better with home care. You feel you are not able to manage the stress of living with tinnitus. Get help right away if: You develop tinnitus after a head injury. You have tinnitus along with any of the following: Dizziness. Nausea and vomiting. Loss of balance. Sudden, severe headache. Vision changes. Facial weakness or weakness of arms or legs. These symptoms may represent a  serious problem that is an emergency. Do not wait to see if the symptoms will go away. Get medical help right away. Call your local emergency services (911 in the [place] Do not drive yourself to the hospital. Summary Tinnitus refers to hearing a sound when there is no actual source for that sound. This is often described as ringing in the ears. Symptoms may affect only one ear (unilateral) or both ears (bilateral). Use a white noise machine, a humidifier, or other devices to mask the sound of tinnitus. Do not use stimulants, such as nicotine, alcohol, or caffeine. These substances may make tinnitus worse. This information is not intended to replace advice given to you by your health care provider. Make sure you discuss any questions you have with your health care provider. Document Revised: 02/23/2020 Document Reviewed: 02/23/2020 Elsevier Patient Education  [place].

## 2021-11-01 NOTE — Assessment & Plan Note (Signed)
Problem has been stable. She is no longer following with endocrinologist. We will continue monitoring TSH annually.

## 2021-11-02 ENCOUNTER — Encounter: Payer: Self-pay | Admitting: Family Medicine

## 2021-11-03 ENCOUNTER — Encounter: Payer: Self-pay | Admitting: Family Medicine

## 2021-11-07 ENCOUNTER — Encounter: Payer: Self-pay | Admitting: Family Medicine

## 2021-11-21 ENCOUNTER — Telehealth: Payer: Self-pay

## 2021-11-21 ENCOUNTER — Encounter: Payer: Self-pay | Admitting: Family Medicine

## 2021-11-21 ENCOUNTER — Ambulatory Visit (INDEPENDENT_AMBULATORY_CARE_PROVIDER_SITE_OTHER): Payer: PPO | Admitting: Family Medicine

## 2021-11-21 DIAGNOSIS — L255 Unspecified contact dermatitis due to plants, except food: Secondary | ICD-10-CM | POA: Diagnosis not present

## 2021-11-21 MED ORDER — CLOBETASOL PROPIONATE 0.05 % EX CREA: 1.0000 | TOPICAL_CREAM | Freq: Two times a day (BID) | CUTANEOUS | 0 refills | Status: DC

## 2021-11-21 NOTE — Telephone Encounter (Signed)
Reclast will end up being 20% copay for pt. She is wanting to know if there are any other oral medications that can be tried since maxed out on Fosamax?

## 2021-11-21 NOTE — Patient Instructions (Addendum)
You may take 25 mg every 6 hours of benadryl for the itching as needed, you may take 50 mg of benadryl at bedtime  Apply the clobetasol cream to the affected areas once or twice a day for 7-10 days - -avoid getting the clobetasol in the eyes.

## 2021-11-21 NOTE — Progress Notes (Unsigned)
   Established Patient Office Visit  Subjective   Patient ID: Molly Wilcox, female    DOB: 05-May-1940  Age: 81 y.o. MRN: [id number]  Chief Complaint  Patient presents with   Rash    Patient complains of an itchy, red rash on the face x1 day, questioned of related to yard work without gloves, tried Benadryl with no relief    Rash This is a new problem. The current episode started yesterday. The problem is unchanged. The affected locations include the left arm and right arm. Pertinent negatives include no shortness of breath. Past treatments include antihistamine.    {History (Optional):23778}  Review of Systems  Respiratory:  Negative for shortness of breath.   Skin:  Positive for rash.      Objective:     BP 100/60 (BP Location: Left Arm, Patient Position: Sitting, Cuff Size: Normal)   Pulse 100   Temp 98.4 F (36.9 C) (Oral)   Ht '5\' 3"'$  (1.6 m)   Wt 119 lb 12.8 oz (54.3 kg)   SpO2 98%   BMI 21.22 kg/m  {Vitals History (Optional):23777}  Physical Exam   No results found for any visits on 11/21/21.  {Labs (Optional):23779}  The ASCVD Risk score (Arnett DK, et al., 2019) failed to calculate for the following reasons:   The 2019 ASCVD risk score is only valid for ages 71 to 41    Assessment & Plan:   Problem List Items Addressed This Visit       Musculoskeletal and Integument   Contact dermatitis due to plant   Relevant Medications   clobetasol cream (TEMOVATE) 0.05 %    No follow-ups on file.    Farrel Conners, MD

## 2021-11-22 NOTE — Assessment & Plan Note (Signed)
Most likely diagnosis, she has an allergy to prednisone, however given the extensiveness of the rash I advised we could try a different steroid and apply topically to reduce her systemic exposure to the steroids. Patient may also take OTC benadryl to help with the itching and discomfort.

## 2021-12-07 ENCOUNTER — Encounter: Payer: Self-pay | Admitting: Family Medicine

## 2021-12-12 NOTE — Telephone Encounter (Signed)
I still think Prolia or Reclast ar the best options at this time. She already took Fosamax, because cost of above, she could try Actonel weekly or monthly. Thanks, BJ

## 2021-12-12 NOTE — Telephone Encounter (Signed)
See my chart encounter.

## 2021-12-16 MED ORDER — RISEDRONATE SODIUM 35 MG PO TABS: 35.0000 mg | ORAL_TABLET | ORAL | 11 refills | Status: DC

## 2021-12-16 NOTE — Addendum Note (Signed)
Addended by: Rodrigo Ran on: 12/16/2021 09:52 AM   Modules accepted: Orders

## 2021-12-28 ENCOUNTER — Other Ambulatory Visit: Payer: Self-pay | Admitting: Family Medicine

## 2021-12-28 DIAGNOSIS — I10 Essential (primary) hypertension: Secondary | ICD-10-CM

## 2022-02-06 ENCOUNTER — Emergency Department (HOSPITAL_BASED_OUTPATIENT_CLINIC_OR_DEPARTMENT_OTHER)
Admission: EM | Admit: 2022-02-06 | Discharge: 2022-02-06 | Disposition: A | Discharge status: Home / Self Care | Payer: PPO | Source: Home / Self Care | Attending: Emergency Medicine | Admitting: Emergency Medicine

## 2022-02-06 ENCOUNTER — Other Ambulatory Visit: Payer: Self-pay

## 2022-02-06 DIAGNOSIS — Z886 Allergy status to analgesic agent status: Secondary | ICD-10-CM | POA: Diagnosis not present

## 2022-02-06 DIAGNOSIS — N390 Urinary tract infection, site not specified: Secondary | ICD-10-CM | POA: Insufficient documentation

## 2022-02-06 DIAGNOSIS — R109 Unspecified abdominal pain: Secondary | ICD-10-CM | POA: Diagnosis not present

## 2022-02-06 DIAGNOSIS — Z7982 Long term (current) use of aspirin: Secondary | ICD-10-CM | POA: Diagnosis not present

## 2022-02-06 DIAGNOSIS — Z79899 Other long term (current) drug therapy: Secondary | ICD-10-CM | POA: Diagnosis not present

## 2022-02-06 DIAGNOSIS — R410 Disorientation, unspecified: Secondary | ICD-10-CM | POA: Diagnosis not present

## 2022-02-06 DIAGNOSIS — D696 Thrombocytopenia, unspecified: Secondary | ICD-10-CM | POA: Diagnosis not present

## 2022-02-06 DIAGNOSIS — E876 Hypokalemia: Secondary | ICD-10-CM | POA: Diagnosis present

## 2022-02-06 DIAGNOSIS — E871 Hypo-osmolality and hyponatremia: Secondary | ICD-10-CM | POA: Diagnosis present

## 2022-02-06 DIAGNOSIS — R531 Weakness: Secondary | ICD-10-CM | POA: Diagnosis not present

## 2022-02-06 DIAGNOSIS — Z888 Allergy status to other drugs, medicaments and biological substances status: Secondary | ICD-10-CM | POA: Diagnosis not present

## 2022-02-06 DIAGNOSIS — E7800 Pure hypercholesterolemia, unspecified: Secondary | ICD-10-CM | POA: Diagnosis present

## 2022-02-06 DIAGNOSIS — R509 Fever, unspecified: Secondary | ICD-10-CM | POA: Diagnosis not present

## 2022-02-06 DIAGNOSIS — E861 Hypovolemia: Secondary | ICD-10-CM | POA: Diagnosis present

## 2022-02-06 DIAGNOSIS — I10 Essential (primary) hypertension: Secondary | ICD-10-CM | POA: Diagnosis present

## 2022-02-06 DIAGNOSIS — N1330 Unspecified hydronephrosis: Secondary | ICD-10-CM | POA: Diagnosis not present

## 2022-02-06 DIAGNOSIS — Z881 Allergy status to other antibiotic agents status: Secondary | ICD-10-CM | POA: Diagnosis not present

## 2022-02-06 DIAGNOSIS — B349 Viral infection, unspecified: Secondary | ICD-10-CM | POA: Diagnosis present

## 2022-02-06 DIAGNOSIS — I7300 Raynaud's syndrome without gangrene: Secondary | ICD-10-CM | POA: Diagnosis present

## 2022-02-06 DIAGNOSIS — M1990 Unspecified osteoarthritis, unspecified site: Secondary | ICD-10-CM | POA: Diagnosis present

## 2022-02-06 DIAGNOSIS — R339 Retention of urine, unspecified: Secondary | ICD-10-CM | POA: Diagnosis not present

## 2022-02-06 DIAGNOSIS — E860 Dehydration: Secondary | ICD-10-CM | POA: Diagnosis present

## 2022-02-06 DIAGNOSIS — J9811 Atelectasis: Secondary | ICD-10-CM | POA: Diagnosis not present

## 2022-02-06 DIAGNOSIS — D6959 Other secondary thrombocytopenia: Secondary | ICD-10-CM | POA: Diagnosis present

## 2022-02-06 DIAGNOSIS — D509 Iron deficiency anemia, unspecified: Secondary | ICD-10-CM | POA: Diagnosis present

## 2022-02-06 DIAGNOSIS — N136 Pyonephrosis: Secondary | ICD-10-CM | POA: Diagnosis present

## 2022-02-06 DIAGNOSIS — D72819 Decreased white blood cell count, unspecified: Secondary | ICD-10-CM | POA: Diagnosis present

## 2022-02-06 DIAGNOSIS — G9341 Metabolic encephalopathy: Secondary | ICD-10-CM | POA: Diagnosis present

## 2022-02-06 DIAGNOSIS — Z20822 Contact with and (suspected) exposure to covid-19: Secondary | ICD-10-CM | POA: Diagnosis present

## 2022-02-06 DIAGNOSIS — Z9851 Tubal ligation status: Secondary | ICD-10-CM | POA: Diagnosis not present

## 2022-02-06 DIAGNOSIS — R7401 Elevation of levels of liver transaminase levels: Secondary | ICD-10-CM | POA: Diagnosis present

## 2022-02-06 DIAGNOSIS — I959 Hypotension, unspecified: Secondary | ICD-10-CM | POA: Diagnosis present

## 2022-02-06 DIAGNOSIS — R4182 Altered mental status, unspecified: Secondary | ICD-10-CM | POA: Diagnosis present

## 2022-02-06 DIAGNOSIS — Z8249 Family history of ischemic heart disease and other diseases of the circulatory system: Secondary | ICD-10-CM | POA: Diagnosis not present

## 2022-02-06 LAB — CBC WITH DIFFERENTIAL/PLATELET
Abs Immature Granulocytes: 0.03 10*3/uL (ref 0.00–0.07)
Basophils Absolute: 0 10*3/uL (ref 0.0–0.1)
Basophils Relative: 0 %
Eosinophils Absolute: 0.2 10*3/uL (ref 0.0–0.5)
Eosinophils Relative: 3 %
HCT: 42.2 % (ref 36.0–46.0)
Hemoglobin: 14.7 g/dL (ref 12.0–15.0)
Immature Granulocytes: 1 %
Lymphocytes Relative: 4 %
Lymphs Abs: 0.2 10*3/uL — ABNORMAL LOW (ref 0.7–4.0)
MCH: 32 pg (ref 26.0–34.0)
MCHC: 34.8 g/dL (ref 30.0–36.0)
MCV: 91.7 fL (ref 80.0–100.0)
Monocytes Absolute: 0.3 10*3/uL (ref 0.1–1.0)
Monocytes Relative: 6 %
Neutro Abs: 4.6 10*3/uL (ref 1.7–7.7)
Neutrophils Relative %: 86 %
Platelets: 103 10*3/uL — ABNORMAL LOW (ref 150–400)
RBC: 4.6 MIL/uL (ref 3.87–5.11)
RDW: 12.3 % (ref 11.5–15.5)
WBC: 5.4 10*3/uL (ref 4.0–10.5)
nRBC: 0 % (ref 0.0–0.2)

## 2022-02-06 LAB — COMPREHENSIVE METABOLIC PANEL
ALT: 25 U/L (ref 0–44)
AST: 31 U/L (ref 15–41)
Albumin: 4.7 g/dL (ref 3.5–5.0)
Alkaline Phosphatase: 38 U/L (ref 38–126)
Anion gap: 12 (ref 5–15)
BUN: 17 mg/dL (ref 8–23)
CO2: 26 mmol/L (ref 22–32)
Calcium: 9.6 mg/dL (ref 8.9–10.3)
Chloride: 86 mmol/L — ABNORMAL LOW (ref 98–111)
Creatinine, Ser: 0.95 mg/dL (ref 0.44–1.00)
GFR, Estimated: 60 mL/min — ABNORMAL LOW (ref >60)
Glucose, Bld: 158 mg/dL — ABNORMAL HIGH (ref 70–99)
Potassium: 3.7 mmol/L (ref 3.5–5.1)
Sodium: 124 mmol/L — ABNORMAL LOW (ref 135–145)
Total Bilirubin: 0.6 mg/dL (ref 0.3–1.2)
Total Protein: 7.6 g/dL (ref 6.5–8.1)

## 2022-02-06 LAB — URINALYSIS, ROUTINE W REFLEX MICROSCOPIC
Bilirubin Urine: NEGATIVE
Glucose, UA: NEGATIVE mg/dL
Ketones, ur: NEGATIVE mg/dL
Nitrite: NEGATIVE
Protein, ur: 30 mg/dL — AB
Specific Gravity, Urine: 1.018 (ref 1.005–1.030)
pH: 6 (ref 5.0–8.0)

## 2022-02-06 LAB — BASIC METABOLIC PANEL
Anion gap: 11 (ref 5–15)
BUN: 15 mg/dL (ref 8–23)
CO2: 29 mmol/L (ref 22–32)
Calcium: 8.9 mg/dL (ref 8.9–10.3)
Chloride: 91 mmol/L — ABNORMAL LOW (ref 98–111)
Creatinine, Ser: 0.85 mg/dL (ref 0.44–1.00)
GFR, Estimated: >60 mL/min (ref >60)
Glucose, Bld: 154 mg/dL — ABNORMAL HIGH (ref 70–99)
Potassium: 3.4 mmol/L — ABNORMAL LOW (ref 3.5–5.1)
Sodium: 131 mmol/L — ABNORMAL LOW (ref 135–145)

## 2022-02-06 MED ORDER — CEPHALEXIN 500 MG PO CAPS: 500.0000 mg | ORAL_CAPSULE | Freq: Two times a day (BID) | ORAL | 0 refills | Status: DC

## 2022-02-06 MED ORDER — LACTATED RINGERS IV BOLUS
1000.0000 mL | Freq: Once | INTRAVENOUS | Status: AC
  Administered 2022-02-06: 1000 mL via INTRAVENOUS

## 2022-02-06 NOTE — ED Notes (Addendum)
ED Provider at bedside with visitor who will be able to transport pt home  when d/c finalized  - d/c held pending bmp

## 2022-02-06 NOTE — ED Triage Notes (Signed)
Pt was brought in by EMS and family told them that pt was confused and not eating and drinking like she should. Requests she be evaluated for dehydration. Pt lives alone at home

## 2022-02-06 NOTE — ED Provider Notes (Signed)
[facility] Provider Note   CSN: [id number] Arrival date & time: 02/06/22  1403     History Chief Complaint  Patient presents with   Dehydration    HPI Molly [facility] is a 81 y.o. female presenting for concern for dehydration.  Her family called EMS because they worried she was not eating and drinking enough.  She has been more fatigued lately though she does note that she went on a large hike just a few days ago over 6 hours.  She feels like she has been doing well at baseline. Unfortunately had a long wait prior to being evaluated.  She states that she has no symptoms at this time and just wanted to be evaluated for dehydration per her daughters. She denies fevers or chills, nausea vomiting, syncope shortness of breath..   Patient's recorded medical, surgical, social, medication list and allergies were reviewed in the Snapshot window as part of the initial history.   Review of Systems   Review of Systems  Constitutional:  Positive for fatigue. Negative for chills and fever.  HENT:  Negative for ear pain and sore throat.   Eyes:  Negative for pain and visual disturbance.  Respiratory:  Negative for cough and shortness of breath.   Cardiovascular:  Negative for chest pain and palpitations.  Gastrointestinal:  Negative for abdominal pain and vomiting.  Genitourinary:  Negative for dysuria and hematuria.  Musculoskeletal:  Negative for arthralgias and back pain.  Skin:  Negative for color change and rash.  Neurological:  Negative for seizures and syncope.  All other systems reviewed and are negative.   Physical Exam Updated Vital Signs BP (!) 149/94   Pulse 91   Temp 98.3 F (36.8 C) (Oral)   Resp 18   Ht '5\' 2"'$  (1.575 m)   Wt 54.9 kg   SpO2 98%   BMI 22.13 kg/m  Physical Exam Vitals and nursing note reviewed.  Constitutional:      General: She is not in acute distress.    Appearance: She is well-developed.  HENT:     Head:  Normocephalic and atraumatic.  Eyes:     Conjunctiva/sclera: Conjunctivae normal.  Cardiovascular:     Rate and Rhythm: Normal rate and regular rhythm.     Heart sounds: No murmur heard. Pulmonary:     Effort: Pulmonary effort is normal. No respiratory distress.     Breath sounds: Normal breath sounds.  Abdominal:     Palpations: Abdomen is soft.     Tenderness: There is no abdominal tenderness.  Musculoskeletal:        General: No swelling.     Cervical back: Neck supple.  Skin:    General: Skin is warm and dry.     Capillary Refill: Capillary refill takes less than 2 seconds.  Neurological:     Mental Status: She is alert.  Psychiatric:        Mood and Affect: Mood normal.      ED Course/ Medical Decision Making/ A&P Clinical Course as of 02/06/22 2235  Mon Feb 06, 2022  1824 N2C [CC]  2016 IVF and repeat Bmp  [CC]    Clinical Course User Index [CC] Tretha Sciara, MD    Procedures Procedures   Medications Ordered in ED Medications  lactated ringers bolus 1,000 mL (0 mLs Intravenous Stopped 02/06/22 2012)    Medical Decision Making:    [name] is a 81 y.o. female who presented to the ED today with  multiple concerns detailed above.     Patient's presentation is complicated by their history of advanced age.  Patient placed on continuous vitals and telemetry monitoring while in ED which was reviewed periodically.   Complete initial physical exam performed, notably the patient  was hemodynamically stable in no acute distress.      Reviewed and confirmed nursing documentation for past medical history, family history, social history.    Initial Assessment:   With the patient's presentation of fatigue, most likely diagnosis is dehydration secondary to patient's recent exertion versus possible developing UTI given her reported frequent urination. Other diagnoses were considered including (but not limited to) metabolic disturbance, anemia, pneumonia.  These are considered less likely due to history of present illness and physical exam findings.   This is most consistent with an acute life/limb threatening illness complicated by underlying chronic conditions.  Initial Plan:  Screening labs including CBC and Metabolic panel to evaluate for infectious or metabolic etiology of disease.  Urinalysis with reflex culture ordered to evaluate for UTI or relevant urologic/nephrologic pathology.  Objective evaluation as below reviewed with plan for close reassessment  Initial Study Results:   Laboratory  All laboratory results reviewed without evidence of clinically relevant pathology.   Exception includes initial hyponatremia.  Final Assessment and Plan:   Patient treated with IV fluids in the emergency department is not ambulatory tolerating p.o. intake.  Repeat laboratory evaluation with gross stabilization of electrolytes.  Likely volume related hyponatremia responsive to IV fluids.  Patient is ambulatory tolerating p.o. intake.  She does likely have a urinary tract infection based on her lab findings today.  We will start her on Keflex twice daily 500 mg recommend she follow-up very closely with her primary care provider for ongoing care management the outpatient setting.  Patient walked down the hall, disconnected herself from the monitor and caught me in the hallway asking for discharge. Given her well appearance I do believe this is reasonable at this time. Disposition:  I have considered need for hospitalization, however, considering all of the above, I believe this patient is stable for discharge at this time.  Patient/family educated about specific return precautions for given chief complaint and symptoms.  Patient/family educated about follow-up with PCP .     Patient/family expressed understanding of return precautions and need for follow-up. Patient spoken to regarding all imaging and laboratory results and appropriate follow up for these  results. All education provided in verbal form with additional information in written form. Time was allowed for answering of patient questions. Patient discharged.    Emergency Department Medication Summary:   Medications  lactated ringers bolus 1,000 mL (0 mLs Intravenous Stopped 02/06/22 2012)         Clinical Impression:  1. Dehydration   2. Urinary tract infection without hematuria, site unspecified      Discharge   Final Clinical Impression(s) / ED Diagnoses Final diagnoses:  Dehydration  Urinary tract infection without hematuria, site unspecified    Rx / DC Orders ED Discharge Orders          Ordered    cephALEXin (KEFLEX) 500 MG capsule  2 times daily        02/06/22 2232              Tretha Sciara, MD 02/06/22 2235

## 2022-02-06 NOTE — ED Notes (Signed)
Pt agreeable with d/c plan as discussed by provider- this nurse has verbally reinforced d/c instructions and provided pt with written copy; pt acknowledges verbal understanding and denies any addl questions concerns needs- ambulatory at d/c independently with steady accompanied by friend - no distress noted

## 2022-02-08 ENCOUNTER — Emergency Department (HOSPITAL_COMMUNITY): Payer: PPO

## 2022-02-08 ENCOUNTER — Telehealth: Payer: Self-pay | Admitting: Family Medicine

## 2022-02-08 ENCOUNTER — Other Ambulatory Visit: Payer: Self-pay

## 2022-02-08 ENCOUNTER — Encounter (HOSPITAL_COMMUNITY): Payer: Self-pay

## 2022-02-08 ENCOUNTER — Inpatient Hospital Stay (HOSPITAL_COMMUNITY)
Admission: EM | Admit: 2022-02-08 | Discharge: 2022-02-11 | DRG: 689 | Disposition: A | Discharge status: Home / Self Care | Payer: PPO | Attending: Internal Medicine | Admitting: Internal Medicine

## 2022-02-08 DIAGNOSIS — R4182 Altered mental status, unspecified: Secondary | ICD-10-CM | POA: Diagnosis present

## 2022-02-08 DIAGNOSIS — G9341 Metabolic encephalopathy: Secondary | ICD-10-CM | POA: Diagnosis present

## 2022-02-08 DIAGNOSIS — M1990 Unspecified osteoarthritis, unspecified site: Secondary | ICD-10-CM | POA: Diagnosis present

## 2022-02-08 DIAGNOSIS — Z9851 Tubal ligation status: Secondary | ICD-10-CM

## 2022-02-08 DIAGNOSIS — E860 Dehydration: Secondary | ICD-10-CM | POA: Diagnosis present

## 2022-02-08 DIAGNOSIS — D509 Iron deficiency anemia, unspecified: Secondary | ICD-10-CM | POA: Diagnosis present

## 2022-02-08 DIAGNOSIS — R339 Retention of urine, unspecified: Secondary | ICD-10-CM

## 2022-02-08 DIAGNOSIS — Z881 Allergy status to other antibiotic agents status: Secondary | ICD-10-CM

## 2022-02-08 DIAGNOSIS — Z79899 Other long term (current) drug therapy: Secondary | ICD-10-CM

## 2022-02-08 DIAGNOSIS — I959 Hypotension, unspecified: Secondary | ICD-10-CM | POA: Diagnosis present

## 2022-02-08 DIAGNOSIS — N136 Pyonephrosis: Principal | ICD-10-CM | POA: Diagnosis present

## 2022-02-08 DIAGNOSIS — D6959 Other secondary thrombocytopenia: Secondary | ICD-10-CM | POA: Diagnosis present

## 2022-02-08 DIAGNOSIS — N1330 Unspecified hydronephrosis: Secondary | ICD-10-CM

## 2022-02-08 DIAGNOSIS — Z886 Allergy status to analgesic agent status: Secondary | ICD-10-CM

## 2022-02-08 DIAGNOSIS — E7800 Pure hypercholesterolemia, unspecified: Secondary | ICD-10-CM | POA: Diagnosis present

## 2022-02-08 DIAGNOSIS — R7401 Elevation of levels of liver transaminase levels: Secondary | ICD-10-CM | POA: Diagnosis present

## 2022-02-08 DIAGNOSIS — Z8249 Family history of ischemic heart disease and other diseases of the circulatory system: Secondary | ICD-10-CM

## 2022-02-08 DIAGNOSIS — Z888 Allergy status to other drugs, medicaments and biological substances status: Secondary | ICD-10-CM

## 2022-02-08 DIAGNOSIS — E861 Hypovolemia: Secondary | ICD-10-CM | POA: Diagnosis present

## 2022-02-08 DIAGNOSIS — Z20822 Contact with and (suspected) exposure to covid-19: Secondary | ICD-10-CM | POA: Diagnosis present

## 2022-02-08 DIAGNOSIS — D696 Thrombocytopenia, unspecified: Secondary | ICD-10-CM

## 2022-02-08 DIAGNOSIS — E871 Hypo-osmolality and hyponatremia: Secondary | ICD-10-CM | POA: Diagnosis present

## 2022-02-08 DIAGNOSIS — D72819 Decreased white blood cell count, unspecified: Secondary | ICD-10-CM | POA: Diagnosis present

## 2022-02-08 DIAGNOSIS — B349 Viral infection, unspecified: Secondary | ICD-10-CM | POA: Diagnosis present

## 2022-02-08 DIAGNOSIS — I7300 Raynaud's syndrome without gangrene: Secondary | ICD-10-CM | POA: Diagnosis present

## 2022-02-08 DIAGNOSIS — Z7982 Long term (current) use of aspirin: Secondary | ICD-10-CM

## 2022-02-08 DIAGNOSIS — E876 Hypokalemia: Secondary | ICD-10-CM | POA: Diagnosis present

## 2022-02-08 DIAGNOSIS — I10 Essential (primary) hypertension: Secondary | ICD-10-CM | POA: Diagnosis present

## 2022-02-08 LAB — CBC WITH DIFFERENTIAL/PLATELET
Abs Immature Granulocytes: 0.02 10*3/uL (ref 0.00–0.07)
Basophils Absolute: 0 10*3/uL (ref 0.0–0.1)
Basophils Relative: 1 %
Eosinophils Absolute: 0 10*3/uL (ref 0.0–0.5)
Eosinophils Relative: 0 %
HCT: 33.4 % — ABNORMAL LOW (ref 36.0–46.0)
Hemoglobin: 11.7 g/dL — ABNORMAL LOW (ref 12.0–15.0)
Immature Granulocytes: 1 %
Lymphocytes Relative: 8 %
Lymphs Abs: 0.2 10*3/uL — ABNORMAL LOW (ref 0.7–4.0)
MCH: 31.4 pg (ref 26.0–34.0)
MCHC: 35 g/dL (ref 30.0–36.0)
MCV: 89.5 fL (ref 80.0–100.0)
Monocytes Absolute: 0.2 10*3/uL (ref 0.1–1.0)
Monocytes Relative: 9 %
Neutro Abs: 2.2 10*3/uL (ref 1.7–7.7)
Neutrophils Relative %: 81 %
Platelets: 33 10*3/uL — ABNORMAL LOW (ref 150–400)
RBC: 3.73 MIL/uL — ABNORMAL LOW (ref 3.87–5.11)
RDW: 11.9 % (ref 11.5–15.5)
WBC: 2.7 10*3/uL — ABNORMAL LOW (ref 4.0–10.5)
nRBC: 0 % (ref 0.0–0.2)

## 2022-02-08 LAB — URINALYSIS, ROUTINE W REFLEX MICROSCOPIC
Bacteria, UA: NONE SEEN
Bilirubin Urine: NEGATIVE
Glucose, UA: NEGATIVE mg/dL
Ketones, ur: NEGATIVE mg/dL
Leukocytes,Ua: NEGATIVE
Nitrite: NEGATIVE
Protein, ur: 30 mg/dL — AB
Specific Gravity, Urine: 1.014 (ref 1.005–1.030)
pH: 6 (ref 5.0–8.0)

## 2022-02-08 LAB — COMPREHENSIVE METABOLIC PANEL
ALT: 45 U/L — ABNORMAL HIGH (ref 0–44)
AST: 62 U/L — ABNORMAL HIGH (ref 15–41)
Albumin: 3.1 g/dL — ABNORMAL LOW (ref 3.5–5.0)
Alkaline Phosphatase: 30 U/L — ABNORMAL LOW (ref 38–126)
Anion gap: 10 (ref 5–15)
BUN: 16 mg/dL (ref 8–23)
CO2: 24 mmol/L (ref 22–32)
Calcium: 7.3 mg/dL — ABNORMAL LOW (ref 8.9–10.3)
Chloride: 93 mmol/L — ABNORMAL LOW (ref 98–111)
Creatinine, Ser: 0.64 mg/dL (ref 0.44–1.00)
GFR, Estimated: >60 mL/min (ref >60)
Glucose, Bld: 149 mg/dL — ABNORMAL HIGH (ref 70–99)
Potassium: 2.2 mmol/L — CL (ref 3.5–5.1)
Sodium: 127 mmol/L — ABNORMAL LOW (ref 135–145)
Total Bilirubin: 1 mg/dL (ref 0.3–1.2)
Total Protein: 5.5 g/dL — ABNORMAL LOW (ref 6.5–8.1)

## 2022-02-08 LAB — LACTIC ACID, PLASMA: Lactic Acid, Venous: 1.8 mmol/L (ref 0.5–1.9)

## 2022-02-08 MED ORDER — IOHEXOL 300 MG/ML  SOLN
80.0000 mL | Freq: Once | INTRAMUSCULAR | Status: AC | PRN
  Administered 2022-02-08: 80 mL via INTRAVENOUS

## 2022-02-08 MED ORDER — SODIUM CHLORIDE 0.9 % IV SOLN
1.0000 g | Freq: Once | INTRAVENOUS | Status: AC
  Administered 2022-02-08: 1 g via INTRAVENOUS
  Filled 2022-02-08: qty 10

## 2022-02-08 MED ORDER — SODIUM CHLORIDE 0.9 % IV BOLUS
1000.0000 mL | Freq: Once | INTRAVENOUS | Status: AC
  Administered 2022-02-08: 1000 mL via INTRAVENOUS

## 2022-02-08 MED ORDER — POTASSIUM CHLORIDE CRYS ER 20 MEQ PO TBCR
40.0000 meq | EXTENDED_RELEASE_TABLET | Freq: Once | ORAL | Status: AC
  Administered 2022-02-08: 40 meq via ORAL
  Filled 2022-02-08: qty 2

## 2022-02-08 MED ORDER — POTASSIUM CHLORIDE 10 MEQ/100ML IV SOLN
10.0000 meq | INTRAVENOUS | Status: AC
  Administered 2022-02-08 – 2022-02-09 (×3): 10 meq via INTRAVENOUS
  Filled 2022-02-08 (×3): qty 100

## 2022-02-08 MED ORDER — SODIUM CHLORIDE 0.9 % IV SOLN: Freq: Once | INTRAVENOUS | Status: AC

## 2022-02-08 NOTE — Telephone Encounter (Signed)
I spoke with pt's daughter. She is going to get patient taken back to the ER via ambulance.

## 2022-02-08 NOTE — ED Provider Notes (Signed)
[facility] [place] [facility] DEPT Provider Note   CSN: [id number] Arrival date & time: 02/08/22  1557     History {Add pertinent medical, surgical, social history, OB history to HPI:1} Chief Complaint  Patient presents with   Altered Mental Status    M[facility] is a 81 y.o. female.  Pt is an 81 yo female presenting via EMS from home for AMS. Patient's daughter states she was very sleepy on Sunday. States on Monday she was confused including continusously stating "I don't know", was unable to list her neighbors names, cannot stay awake during conversations, etc.  Her daughter states she is usually [employer], "quick as a fiddle", in 3 different senior clubs, tech savy, and very active.   Daughter reports fever of 101.5 F today at 2:00 PM. Received ibuprofen 400 mg shortly after.   The history is provided by the patient. No language interpreter was used.  Altered Mental Status      Home Medications Prior to Admission medications   Medication Sig Start Date End Date Taking? Authorizing Provider  amLODipine (NORVASC) 10 MG tablet TAKE 1 TABLET BY MOUTH EVERY DAY 05/06/21   Martinique, Betty G, MD  Ascorbic Acid (VITAMIN C) 250 MG CHEW Chew 1 tablet by mouth daily. 04/24/16   [provider]  aspirin EC 81 MG tablet Take 81 mg by mouth daily.    [provider]  Calcium-Phosphorus-Vitamin D (CALCIUM GUMMIES) [phone] MG-MG-UNIT CHEW Chew 1 tablet by mouth daily. 05/05/21   [provider]  cephALEXin (KEFLEX) 500 MG capsule Take 1 capsule (500 mg total) by mouth 2 (two) times daily for 5 days. 02/06/22 02/11/22  Tretha Sciara, MD  clobetasol cream (TEMOVATE) 1.57 % Apply 1 Application topically 2 (two) times daily. 11/21/21   Farrel Conners, MD  Cyanocobalamin (VITAMIN B-12) 5000 MCG TBDP Take 1 tablet by mouth every 3 days. 05/02/17   [provider]  lisinopril-hydrochlorothiazide (ZESTORETIC) 20-25 MG tablet TAKE 1 TABLET BY MOUTH  EVERY DAY 12/30/21   Martinique, Betty G, MD  MAGNESIUM OXIDE PO Take 250 mg by mouth daily.     [provider]  MYRBETRIQ 50 MG TB24 tablet Take 50 mg by mouth daily. 08/25/21   [provider]  Polyethylene Glycol 3350 (MIRALAX PO) Take by mouth.    [provider]  risedronate (ACTONEL) 35 MG tablet Take 1 tablet (35 mg total) by mouth every 7 (seven) days. with water on empty stomach, nothing by mouth or lie down for next 30 minutes. 12/16/21   Martin[place], Betty G, MD  simvastatin (ZOCOR) 10 MG tablet TAKE 1 TABLET BY MOUTH EVERYDAY AT BEDTIME 05/06/21   Martin[place], Betty G, MD      Allergies    Prednisone, Ciprofloxacin, and Ibuprofen    Review of Systems   Review of Systems  Physical Exam Updated Vital Signs BP (!) 80/45 (BP Location: Left Arm)   Pulse 65   Temp 97.8 F (36.6 C) (Oral)   Resp 18   Ht '5\' 2"'$  (1.575 m)   Wt 54.5 kg   SpO2 94%   BMI 21.98 kg/m  Physical Exam  ED Results / Procedures / Treatments   Labs (all labs ordered are listed, but only abnormal results are displayed) Labs Reviewed - No data to display  EKG None  Radiology No results found.  Procedures Procedures  {Document cardiac monitor, telemetry assessment procedure when appropriate:1}  Medications Ordered in ED Medications - No data to display  ED  Course/ Medical Decision Making/ A&P                           Medical Decision Making Amount and/or Complexity of Data Reviewed Labs: ordered. Radiology: ordered.   ***  {Document critical care time when appropriate:1} {Document review of labs and clinical decision tools ie heart score, Chads2Vasc2 etc:1}  {Document your independent review of radiology images, and any outside records:1} {Document your discussion with family members, caretakers, and with consultants:1} {Document social determinants of health affecting pt's care:1} {Document your decision making why or why not admission, treatments were needed:1} Final  Clinical Impression(s) / ED Diagnoses Final diagnoses:  None    Rx / DC Orders ED Discharge Orders     None

## 2022-02-08 NOTE — Telephone Encounter (Signed)
Daughter (Cinda) called to get clarification on what pills Pt is supposed to be taking daily (am & pm)  Please call her back.

## 2022-02-08 NOTE — Telephone Encounter (Signed)
Pt was taken to UC on 02/06/22.  Daughter - Johnathan Hausen called to ask for a call back ASAP!!  Pt is very confused, lethargic, Daughter needs to know if Pt has an adversity to Ibuprofen.  Daughter is very concerned and may want to take mom to the ED.  Call transferred to Triage Nurse.

## 2022-02-08 NOTE — ED Triage Notes (Addendum)
Pt bib ems from home pt daughter called ems due to pt being confused from baseline.  Pt lives alone at home, denies urinary symptoms

## 2022-02-08 NOTE — Telephone Encounter (Signed)
Tried contacting pt's daughter, went to voicemail.

## 2022-02-08 NOTE — ED Notes (Signed)
Labeled urine specimen and culture sent to lab. ENMiles 

## 2022-02-08 NOTE — Telephone Encounter (Signed)
Patient is at ED

## 2022-02-08 NOTE — ED Notes (Signed)
Bladder scan completed per MD order- measurement of 370m. RN advised. [employer]

## 2022-02-09 ENCOUNTER — Inpatient Hospital Stay (HOSPITAL_COMMUNITY): Payer: PPO

## 2022-02-09 ENCOUNTER — Emergency Department (HOSPITAL_COMMUNITY): Payer: PPO

## 2022-02-09 DIAGNOSIS — E861 Hypovolemia: Secondary | ICD-10-CM | POA: Diagnosis present

## 2022-02-09 DIAGNOSIS — Z881 Allergy status to other antibiotic agents status: Secondary | ICD-10-CM | POA: Diagnosis not present

## 2022-02-09 DIAGNOSIS — Z7982 Long term (current) use of aspirin: Secondary | ICD-10-CM | POA: Diagnosis not present

## 2022-02-09 DIAGNOSIS — Z8249 Family history of ischemic heart disease and other diseases of the circulatory system: Secondary | ICD-10-CM | POA: Diagnosis not present

## 2022-02-09 DIAGNOSIS — D696 Thrombocytopenia, unspecified: Secondary | ICD-10-CM

## 2022-02-09 DIAGNOSIS — D72819 Decreased white blood cell count, unspecified: Secondary | ICD-10-CM | POA: Diagnosis present

## 2022-02-09 DIAGNOSIS — M1990 Unspecified osteoarthritis, unspecified site: Secondary | ICD-10-CM | POA: Diagnosis present

## 2022-02-09 DIAGNOSIS — Z20822 Contact with and (suspected) exposure to covid-19: Secondary | ICD-10-CM | POA: Diagnosis present

## 2022-02-09 DIAGNOSIS — Z79899 Other long term (current) drug therapy: Secondary | ICD-10-CM | POA: Diagnosis not present

## 2022-02-09 DIAGNOSIS — N136 Pyonephrosis: Secondary | ICD-10-CM | POA: Diagnosis present

## 2022-02-09 DIAGNOSIS — Z9851 Tubal ligation status: Secondary | ICD-10-CM | POA: Diagnosis not present

## 2022-02-09 DIAGNOSIS — E871 Hypo-osmolality and hyponatremia: Secondary | ICD-10-CM | POA: Diagnosis present

## 2022-02-09 DIAGNOSIS — G9341 Metabolic encephalopathy: Secondary | ICD-10-CM | POA: Diagnosis present

## 2022-02-09 DIAGNOSIS — R4182 Altered mental status, unspecified: Secondary | ICD-10-CM | POA: Diagnosis present

## 2022-02-09 DIAGNOSIS — D509 Iron deficiency anemia, unspecified: Secondary | ICD-10-CM | POA: Diagnosis present

## 2022-02-09 DIAGNOSIS — E876 Hypokalemia: Secondary | ICD-10-CM | POA: Diagnosis present

## 2022-02-09 DIAGNOSIS — I7300 Raynaud's syndrome without gangrene: Secondary | ICD-10-CM | POA: Diagnosis present

## 2022-02-09 DIAGNOSIS — I959 Hypotension, unspecified: Secondary | ICD-10-CM | POA: Diagnosis present

## 2022-02-09 DIAGNOSIS — I10 Essential (primary) hypertension: Secondary | ICD-10-CM | POA: Diagnosis present

## 2022-02-09 DIAGNOSIS — E860 Dehydration: Secondary | ICD-10-CM | POA: Diagnosis present

## 2022-02-09 DIAGNOSIS — R7401 Elevation of levels of liver transaminase levels: Secondary | ICD-10-CM | POA: Diagnosis present

## 2022-02-09 DIAGNOSIS — B349 Viral infection, unspecified: Secondary | ICD-10-CM | POA: Diagnosis present

## 2022-02-09 DIAGNOSIS — D6959 Other secondary thrombocytopenia: Secondary | ICD-10-CM | POA: Diagnosis present

## 2022-02-09 DIAGNOSIS — Z888 Allergy status to other drugs, medicaments and biological substances status: Secondary | ICD-10-CM | POA: Diagnosis not present

## 2022-02-09 DIAGNOSIS — E7800 Pure hypercholesterolemia, unspecified: Secondary | ICD-10-CM | POA: Diagnosis present

## 2022-02-09 DIAGNOSIS — Z886 Allergy status to analgesic agent status: Secondary | ICD-10-CM | POA: Diagnosis not present

## 2022-02-09 LAB — RETICULOCYTES
Immature Retic Fract: 7.7 % (ref 2.3–15.9)
RBC.: 3.93 MIL/uL (ref 3.87–5.11)
Retic Count, Absolute: 48.7 10*3/uL (ref 19.0–186.0)
Retic Ct Pct: 1.2 % (ref 0.4–3.1)

## 2022-02-09 LAB — TYPE AND SCREEN
ABO/RH(D): AB POS
Antibody Screen: NEGATIVE

## 2022-02-09 LAB — COMPREHENSIVE METABOLIC PANEL
ALT: 49 U/L — ABNORMAL HIGH (ref 0–44)
AST: 62 U/L — ABNORMAL HIGH (ref 15–41)
Albumin: 2.9 g/dL — ABNORMAL LOW (ref 3.5–5.0)
Alkaline Phosphatase: 35 U/L — ABNORMAL LOW (ref 38–126)
Anion gap: 7 (ref 5–15)
BUN: 13 mg/dL (ref 8–23)
CO2: 23 mmol/L (ref 22–32)
Calcium: 7.2 mg/dL — ABNORMAL LOW (ref 8.9–10.3)
Chloride: 102 mmol/L (ref 98–111)
Creatinine, Ser: 0.51 mg/dL (ref 0.44–1.00)
GFR, Estimated: >60 mL/min (ref >60)
Glucose, Bld: 118 mg/dL — ABNORMAL HIGH (ref 70–99)
Potassium: 3.4 mmol/L — ABNORMAL LOW (ref 3.5–5.1)
Sodium: 132 mmol/L — ABNORMAL LOW (ref 135–145)
Total Bilirubin: 0.9 mg/dL (ref 0.3–1.2)
Total Protein: 5.8 g/dL — ABNORMAL LOW (ref 6.5–8.1)

## 2022-02-09 LAB — CBC
HCT: 35.4 % — ABNORMAL LOW (ref 36.0–46.0)
Hemoglobin: 12.5 g/dL (ref 12.0–15.0)
MCH: 31.9 pg (ref 26.0–34.0)
MCHC: 35.3 g/dL (ref 30.0–36.0)
MCV: 90.3 fL (ref 80.0–100.0)
Platelets: 26 10*3/uL — CL (ref 150–400)
RBC: 3.92 MIL/uL (ref 3.87–5.11)
RDW: 12.2 % (ref 11.5–15.5)
WBC: 2.5 10*3/uL — ABNORMAL LOW (ref 4.0–10.5)
nRBC: 0 % (ref 0.0–0.2)

## 2022-02-09 LAB — RESP PANEL BY RT-PCR (FLU A&B, COVID) ARPGX2
Influenza A by PCR: NEGATIVE
Influenza B by PCR: NEGATIVE
SARS Coronavirus 2 by RT PCR: NEGATIVE

## 2022-02-09 LAB — PROTIME-INR
INR: 1.2 (ref 0.8–1.2)
Prothrombin Time: 14.7 seconds (ref 11.4–15.2)

## 2022-02-09 LAB — C-REACTIVE PROTEIN: CRP: 9.3 mg/dL — ABNORMAL HIGH (ref <1.0)

## 2022-02-09 LAB — IRON AND TIBC
Iron: 20 ug/dL — ABNORMAL LOW (ref 28–170)
Saturation Ratios: 8 % — ABNORMAL LOW (ref 10.4–31.8)
TIBC: 245 ug/dL — ABNORMAL LOW (ref 250–450)
UIBC: 225 ug/dL

## 2022-02-09 LAB — PROCALCITONIN: Procalcitonin: 0.92 ng/mL

## 2022-02-09 LAB — FERRITIN: Ferritin: 2800 ng/mL — ABNORMAL HIGH (ref 11–307)

## 2022-02-09 LAB — SEDIMENTATION RATE: Sed Rate: 15 mm/hr (ref 0–22)

## 2022-02-09 LAB — VITAMIN B12: Vitamin B-12: 2767 pg/mL — ABNORMAL HIGH (ref 180–914)

## 2022-02-09 LAB — TSH: TSH: 1.322 u[IU]/mL (ref 0.350–4.500)

## 2022-02-09 LAB — ABO/RH: ABO/RH(D): AB POS

## 2022-02-09 LAB — FOLATE: Folate: 11.4 ng/mL (ref >5.9)

## 2022-02-09 LAB — LACTIC ACID, PLASMA: Lactic Acid, Venous: 1.6 mmol/L (ref 0.5–1.9)

## 2022-02-09 LAB — AMMONIA: Ammonia: 18 umol/L (ref 9–35)

## 2022-02-09 LAB — LACTATE DEHYDROGENASE: LDH: 340 U/L — ABNORMAL HIGH (ref 98–192)

## 2022-02-09 MED ORDER — POTASSIUM CHLORIDE CRYS ER 20 MEQ PO TBCR
20.0000 meq | EXTENDED_RELEASE_TABLET | Freq: Two times a day (BID) | ORAL | Status: AC
  Administered 2022-02-09 – 2022-02-10 (×2): 20 meq via ORAL
  Filled 2022-02-09 (×2): qty 1

## 2022-02-09 MED ORDER — ALBUTEROL SULFATE (2.5 MG/3ML) 0.083% IN NEBU: 2.5000 mg | INHALATION_SOLUTION | RESPIRATORY_TRACT | Status: DC | PRN

## 2022-02-09 MED ORDER — ACETAMINOPHEN 650 MG RE SUPP: 650.0000 mg | Freq: Four times a day (QID) | RECTAL | Status: DC | PRN

## 2022-02-09 MED ORDER — SODIUM CHLORIDE 0.9 % IV SOLN: INTRAVENOUS | Status: AC

## 2022-02-09 MED ORDER — ONDANSETRON HCL 4 MG PO TABS: 4.0000 mg | ORAL_TABLET | Freq: Four times a day (QID) | ORAL | Status: DC | PRN

## 2022-02-09 MED ORDER — ONDANSETRON HCL 4 MG/2ML IJ SOLN: 4.0000 mg | Freq: Four times a day (QID) | INTRAMUSCULAR | Status: DC | PRN

## 2022-02-09 MED ORDER — ACETAMINOPHEN 325 MG PO TABS
650.0000 mg | ORAL_TABLET | Freq: Four times a day (QID) | ORAL | Status: DC | PRN
  Administered 2022-02-09 – 2022-02-10 (×2): 650 mg via ORAL
  Filled 2022-02-09 (×3): qty 2

## 2022-02-09 MED ORDER — SODIUM CHLORIDE 0.9 % IV SOLN
1.0000 g | INTRAVENOUS | Status: DC
  Administered 2022-02-09 – 2022-02-10 (×2): 1 g via INTRAVENOUS
  Filled 2022-02-09 (×2): qty 10

## 2022-02-09 NOTE — ED Notes (Addendum)
This RN tried to call Dr. Gilman Schmidt via telephone and also tried messaging her on secure chat without any success about pt's platelets.

## 2022-02-09 NOTE — Consult Note (Signed)
Hematology/Oncology Consult Note  Clinical Summary: [name] is an 81 year old female who was admitted due to acute metabolic encephalopathy thought to be secondary to urinary tract infection.  During the course of her admission she has developed leukopenia and thrombocytopenia.  Reason for Consult: Leukopenia/thrombocytopenia in setting of urinary tract infection  HPI: [name] is an 81 year old female with medical history significant for hypertension, hyperlipidemia, and Raynaud's who initially presented on 02/06/2022 with lethargy, poor p.o. intake and was found to have a urinary tract infection.  She was prescribed Keflex therapy but unfortunately did not take this.  She again presented on 02/08/2022 with confusion.  She was given IV hydration and started on ceftriaxone and admitted to the hospital for further evaluation and management.  In regards to her blood counts which she initially presented on 02/06/2022 she was found to have white blood cell count 5.4, hemoglobin 14.7, and platelets of 103.  On repeat evaluation on 02/08/2022 patient was found to have white blood cell count 2.7, hemoglobin 10.7, and platelets of 33.  Due to concern for these findings hematology service was consulted.  On exam today [name] is accompanied by her sister.  She reports that she has not had any overt signs of bleeding, bruising, or dark stools.  Her urine has also been of normal color with no blood.  She reports she did have a fever measuring up to 102 F.  She has had no chills and does not feel febrile.  Her memory is somewhat hazy around the events surrounding her urinary tract infection.  She currently denies any chills, sweats, nausea, vomiting, or diarrhea.  Full 10 point ROS was otherwise negative.   O:  Vitals:   02/09/22 1057 02/09/22 1413  BP: (!) 159/71 117/89  Pulse: 98 88  Resp: 18 19  Temp: (!) 102.3 F (39.1 C)   SpO2: 96% 97%      Latest Ref Rng & Units  02/09/2022    2:25 AM 02/08/2022    5:55 PM 02/06/2022    9:01 PM  CMP  Glucose 70 - 99 mg/dL 118  149  154   BUN 8 - 23 mg/dL '13  16  15   '$ Creatinine 0.44 - 1.00 mg/dL 0.51  0.64  0.85   Sodium 135 - 145 mmol/L 132  127  131   Potassium 3.5 - 5.1 mmol/L 3.4  2.2  3.4   Chloride 98 - 111 mmol/L 102  93  91   CO2 22 - 32 mmol/L '23  24  29   '$ Calcium 8.9 - 10.3 mg/dL 7.2  7.3  8.9   Total Protein 6.5 - 8.1 g/dL 5.8  5.5    Total Bilirubin 0.3 - 1.2 mg/dL 0.9  1.0    Alkaline Phos 38 - 126 U/L 35  30    AST 15 - 41 U/L 62  62    ALT 0 - 44 U/L 49  45        Latest Ref Rng & Units 02/09/2022    2:25 AM 02/08/2022    5:55 PM 02/06/2022    2:49 PM  CBC  WBC 4.0 - 10.5 K/uL 2.5  2.7  5.4   Hemoglobin 12.0 - 15.0 g/dL 12.5  11.7  14.7   Hematocrit 36.0 - 46.0 % 35.4  33.4  42.2   Platelets 150 - 400 K/uL 26  33  103       GENERAL: well appearing elderly Caucasian female in  NAD  SKIN: skin color, texture, turgor are normal, no rashes or significant lesions EYES: conjunctiva are pink and non-injected, sclera clear LUNGS: clear to auscultation and percussion with normal breathing effort HEART: regular rate & rhythm and no murmurs and no lower extremity edema Musculoskeletal: no cyanosis of digits and no clubbing  PSYCH: alert & oriented x 3, fluent speech NEURO: no focal motor/sensory deficits  Assessment/Plan: [name] is an 81 year old female who was admitted due to acute metabolic encephalopathy thought to be secondary to urinary tract infection.  During the course of her admission she has developed leukopenia and thrombocytopenia.  # Leukopenia/Thrombocytopenia in Setting of UTI -- At this time findings are most consistent with leukopenia and thrombocytopenia secondary to systemic inflammation from urinary tract infection. -- Anticipate that with supportive care and hydration with broad-spectrum antibiotics her blood counts should steadily improve. -- Recommend daily  CBCs. -- In order to rule out alternative etiologies we will also check for nutritional deficiencies.  We will check methylmalonic acid, and homocystine.  Vitamin B12 was noted to be 2767 and folate was 11.4. --will plan for outpatient follow up in Hematology clinic to assure counts improve    Ledell Peoples, MD Department of Hematology/Oncology [place] [facility] Phone: [phone] Pager: [phone] Email: [email or web address]

## 2022-02-09 NOTE — Progress Notes (Signed)
Consultation Progress Note   Patient: Molly [facility] ZOX:[id number] DOB: Oct 10, 1940 DOA: 02/08/2022 DOS: the patient was seen and examined on 02/09/2022 Primary service: DibiaManfred Shirts, MD  Brief hospital course:  81 y.o. female with medical history significant of Hypertension, High cholesterol, Raynaud's, who has interim history of evaluation at [facility] 02/06/2022 at request of family due to lethargy, poor intake  and concern for dehydration. ON evaluation at that time patient was found to have dehydration with associated hyponatremia that resolved with ivf on repeat labst, as well as UTI for which she was discharged on Keflex. Of note patient  never filled keflex.Patient now returns 3 days later due to family being more concerned about her change in mental status. Patient lives alone and at baseline is able to complete her ADLS/IDLS however over the last 4-5 days patient has note been herself. Per daughter patient also had fever fo 101.5 this afternoon. Patient is more confused and is not at baseline. Per family  described as falling asleep mid-conversation not knowing friends that she sees daily. Patient currently, appears more awake and alert she states she remembers feeling confused and not quite back to her baseline. She denies n/v/d/dysuria/ chest pain or sob.    Assessment and Plan: Acute Metabolic Encephalopathy  -Likely due to UTI, this morning mental status has improved.    UTI/cystitis-continue antibiotics follow urine cultures.  Thrombocytopenia-low platelets noted, clear etiology, no prior history of thrombocytopenia per patient. No evidence of bleeding. Would consult Heme/Onc Would check Vitamin b12, Folate  Hyponatremia-proved with IV fluids, likely secondary to dehydration.  Currently at 132 monitor labs daily.  Hypokalemia-replete orally.  Mild bilateral hydronephrosis  Urinary retention -continue with foley cath  -hold Mybertiq  Iron Deficiency anemia -Iron  levels of 20, Hb of 12.5 today dropped from 14.7 -Trend hemoglobin, feelings of bleeding. Transfuse if less than 7. -   Anemia nos -repeat h/h  -noted  14.7-11.7 ( baseline around 14)  -partially possibly related to hemoconcentration but noted prior baseline around 14  -no complaints regarding bleeding  -check fob  -monitor h/h  -check anemia labs  -type and cross  -transfuse if less than 7   Mild transaminitis  -possible related to infection / period of hypotension -continue to trend  -CT abd no acute liver findings noted    Leukopenia -possible related to infection/ ? Viral infection  -monitor count, await for respiratory panel    Hypertension -hypotensive on admit  -hold anti-htn medications     High cholesterol -resume home regimen       Raynaud's -no active issues         [facility] will continue to follow the patient.  Subjective: Seen at the ER, she has no complaints, feels much better reports improvement with her confusion.  Physical Exam: Vitals:   02/09/22 0418 02/09/22 0545 02/09/22 0625 02/09/22 0630  BP:  124/60 138/63 (!) 140/67  Pulse:  81 88 86  Resp:   18 18  Temp: 99 F (37.2 C)     TempSrc: Oral     SpO2:  95% 97% 98%  Weight:      Height:      Constitutional: NAD, calm, comfortable  Eyes: PERRL, lids and conjunctivae normal ENMT: Mucous membranes are dry. Posterior pharynx clear of any exudate or lesions.Normal dentition.  Neck: normal, supple, no masses, no thyromegaly Respiratory: clear to auscultation bilaterally, no wheezing, no crackles. Normal respiratory effort. No accessory muscle use.  Cardiovascular: Regular rate  and rhythm, no murmurs / rubs / gallops. No extremity edema. 2+ pedal pulses. No carotid bruits.  Abdomen: no tenderness, no masses palpated. No hepatosplenomegaly. Bowel sounds positive.  Musculoskeletal: no clubbing / cyanosis. No joint deformity upper and lower extremities. Good ROM, no contractures. Normal muscle  tone.  Skin: no rashes, lesions, ulcers. No induration Neurologic: CN 2-12 grossly intact. Sensation intact, Strength 5/5 in all 4.  Psychiatric: Normal judgment and insight. Alert and oriented x 3. Normal mood.      Data Reviewed:  There are no new results to review at this time.  Family Communication:   Time spent: 15 minutes.  Author: Cristela Felt, MD 02/09/2022 10:41 AM  For on call review [email or web address].

## 2022-02-09 NOTE — ED Notes (Signed)
Patient transported to MRI 

## 2022-02-09 NOTE — H&P (Addendum)
History and Physical    Molly Wilcox [id number]:[id number] DOB: 1940-04-07 DOA: 02/08/2022  PCP: Martinique, Betty G, MD  Patient coming from: home  I have personally briefly reviewed patient's old medical records in [place]  Chief Complaint: change in ms recent dx of UTI  HPI: Molly Wilcox is a 81 y.o. female with medical history significant of Hypertension, High cholesterol, Raynaud's, who has interim history of evaluation at [facility] 02/06/2022 at request of family due to lethargy, poor intake  and concern for dehydration. ON evaluation at that time patient was found to have dehydration with associated hyponatremia that resolved with ivf on repeat labst, as well as UTI for which she was discharged on Keflex. Of note patient  never filled keflex.Patient now returns 3 days later due to family being more concerned about her change in mental status. Patient lives alone and at baseline is able to complete her ADLS/IDLS however over the last 4-5 days patient has note been herself. Per daughter patient also had fever fo 101.5 this afternoon. Patient is more confused and is not at baseline. Per family  described as falling asleep mid-conversation not knowing friends that she sees daily. Patient currently, appears more awake and alert she states she remembers feeling confused and not quite back to her baseline. She denies n/v/d/dysuria/ chest pain or sob.   ED Course:  Tmx 97.8 , bp 103/57-80/45, hr 74, rr 18 , sat 96% on ra  Labs Wbc 2.7 (5.4), Hbg 11.7 (14.7),plt 33/103 Na 127 ( base 138/ prior 131), glu 149,  ast 62, alt 45  alkphos 30 K 2.2 Lactic 1.8 Cthead: IMPRESSION: No acute intracranial findings are seen in noncontrast CT brain. Atrophy. Small-vessel disease.  UA neg  CT abd/pelvis There is no evidence of intestinal obstruction or pneumoperitoneum. Appendix is not dilated.   There is mild bilateral hydronephrosis, more so on the right side. This may suggest  ureteropelvic junction obstruction or related to distention of the urinary bladder. There is marked distention of the bladder measuring up to 14.9 cm in maximum diameter.   Minimal pericardial effusion. Aortic arteriosclerosis. There are scattered coronary artery calcifications. Small hiatal hernia  Tx ns 1L , CTX, potassium  In ED patient found to have urinary retention with hydronephrosis on CT abdomen-  foley cath inserted at that time  UA: unremarkable , culture sent Review of Systems: As per HPI otherwise 10 point review of systems negative.   Past Medical History:  Diagnosis Date   High cholesterol    Hypertension    Osteoarthritis    Raynaud disease     Past Surgical History:  Procedure Laterality Date   TUBAL LIGATION       reports that she has never smoked. She has never used smokeless tobacco. She reports current alcohol use. She reports that she does not use drugs.  Allergies  Allergen Reactions   Prednisone Other (See Comments)    Dehydration, amnesia    Ciprofloxacin     GI issues   Ibuprofen Nausea Only and Other (See Comments)    Nausea & headaches    Family History  Problem Relation Age of Onset   Heart disease Father    Thyroid disease Neg Hx     Prior to Admission medications   Medication Sig Start Date End Date Taking? Authorizing Provider  amLODipine (NORVASC) 10 MG tablet TAKE 1 TABLET BY MOUTH EVERY DAY 05/06/21   Martinique, Betty G, MD  Ascorbic Acid (VITAMIN  C) 250 MG CHEW Chew 1 tablet by mouth daily. 04/24/16   [provider]  aspirin EC 81 MG tablet Take 81 mg by mouth daily.    [provider]  Calcium-Phosphorus-Vitamin D (CALCIUM GUMMIES) [phone] MG-MG-UNIT CHEW Chew 1 tablet by mouth daily. 05/05/21   [provider]  cephALEXin (KEFLEX) 500 MG capsule Take 1 capsule (500 mg total) by mouth 2 (two) times daily for 5 days. 02/06/22 02/11/22  Tretha Sciara, MD  clobetasol cream (TEMOVATE) 0.09 % Apply 1  Application topically 2 (two) times daily. 11/21/21   Farrel Conners, MD  Cyanocobalamin (VITAMIN B-12) 5000 MCG TBDP Take 1 tablet by mouth every 3 days. 05/02/17   [provider]  lisinopril-hydrochlorothiazide (ZESTORETIC) 20-25 MG tablet TAKE 1 TABLET BY MOUTH EVERY DAY 12/30/21   Martinique, Betty G, MD  MAGNESIUM OXIDE PO Take 250 mg by mouth daily.     [provider]  MYRBETRIQ 50 MG TB24 tablet Take 50 mg by mouth daily. 08/25/21   [provider]  Polyethylene Glycol 3350 (MIRALAX PO) Take by mouth.    [provider]  risedronate (ACTONEL) 35 MG tablet Take 1 tablet (35 mg total) by mouth every 7 (seven) days. with water on empty stomach, nothing by mouth or lie down for next 30 minutes. 12/16/21   Martinique, Betty G, MD  simvastatin (ZOCOR) 10 MG tablet TAKE 1 TABLET BY MOUTH EVERYDAY AT BEDTIME 05/06/21   Martinique, Betty G, MD    Physical Exam: Vitals:   02/08/22 2130 02/08/22 2215 02/08/22 2315 02/08/22 2345  BP: 128/62 118/63 94/70 (!) 105/54  Pulse: 79 77 64 77  Resp: (!) 23 (!) 24 18 (!) 26  Temp:      TempSrc:      SpO2: 99% 97% 100% 98%  Weight:      Height:        Constitutional: NAD, calm, comfortable Vitals:   02/08/22 2130 02/08/22 2215 02/08/22 2315 02/08/22 2345  BP: 128/62 118/63 94/70 (!) 105/54  Pulse: 79 77 64 77  Resp: (!) 23 (!) 24 18 (!) 26  Temp:      TempSrc:      SpO2: 99% 97% 100% 98%  Weight:      Height:       Eyes: PERRL, lids and conjunctivae normal ENMT: Mucous membranes are dry. Posterior pharynx clear of any exudate or lesions.Normal dentition.  Neck: normal, supple, no masses, no thyromegaly Respiratory: clear to auscultation bilaterally, no wheezing, no crackles. Normal respiratory effort. No accessory muscle use.  Cardiovascular: Regular rate and rhythm, no murmurs / rubs / gallops. No extremity edema. 2+ pedal pulses. No carotid bruits.  Abdomen: no tenderness, no masses palpated. No hepatosplenomegaly.  Bowel sounds positive.  Musculoskeletal: no clubbing / cyanosis. No joint deformity upper and lower extremities. Good ROM, no contractures. Normal muscle tone.  Skin: no rashes, lesions, ulcers. No induration Neurologic: CN 2-12 grossly intact. Sensation intact, Strength 5/5 in all 4.  Psychiatric: Normal judgment and insight. Alert and oriented x 3. Normal mood.    Labs on Admission: I have personally reviewed following labs and imaging studies  CBC: Recent Labs  Lab 02/06/22 1449 02/08/22 1755  WBC 5.4 2.7*  NEUTROABS 4.6 2.2  HGB 14.7 11.7*  HCT 42.2 33.4*  MCV 91.7 89.5  PLT 103* 33*   Basic Metabolic Panel: Recent Labs  Lab 02/06/22 1449 02/06/22 2101 02/08/22 1755  NA 124* 131* 127*  K 3.7 3.4* 2.2*  CL 86* 91* 93*  CO2 '26 29 24  '$ GLUCOSE 158* 154* 149*  BUN '17 15 16  '$ CREATININE 0.95 0.85 0.64  CALCIUM 9.6 8.9 7.3*   GFR: Estimated Creatinine Clearance: 42.9 mL/min (by C-G formula based on SCr of 0.64 mg/dL). Liver Function Tests: Recent Labs  Lab 02/06/22 1449 02/08/22 1755  AST 31 62*  ALT 25 45*  ALKPHOS 38 30*  BILITOT 0.6 1.0  PROT 7.6 5.5*  ALBUMIN 4.7 3.1*   No results for input(s): "LIPASE", "AMYLASE" in the last 168 hours. No results for input(s): "AMMONIA" in the last 168 hours. Coagulation Profile: No results for input(s): "INR", "PROTIME" in the last 168 hours. Cardiac Enzymes: No results for input(s): "CKTOTAL", "CKMB", "CKMBINDEX", "TROPONINI" in the last 168 hours. BNP (last 3 results) No results for input(s): "PROBNP" in the last 8760 hours. HbA1C: No results for input(s): "HGBA1C" in the last 72 hours. CBG: No results for input(s): "GLUCAP" in the last 168 hours. Lipid Profile: No results for input(s): "CHOL", "HDL", "LDLCALC", "TRIG", "CHOLHDL", "LDLDIRECT" in the last 72 hours. Thyroid Function Tests: No results for input(s): "TSH", "T4TOTAL", "FREET4", "T3FREE", "THYROIDAB" in the last 72 hours. Anemia Panel: No results for  input(s): "VITAMINB12", "FOLATE", "FERRITIN", "TIBC", "IRON", "RETICCTPCT" in the last 72 hours. Urine analysis:    Component Value Date/Time   COLORURINE YELLOW 02/08/2022 2318   APPEARANCEUR CLEAR 02/08/2022 2318   LABSPEC 1.014 02/08/2022 2318   PHURINE 6.0 02/08/2022 2318   GLUCOSEU NEGATIVE 02/08/2022 2318   HGBUR SMALL (A) 02/08/2022 2318   BILIRUBINUR NEGATIVE 02/08/2022 2318   KETONESUR NEGATIVE 02/08/2022 2318   PROTEINUR 30 (A) 02/08/2022 2318   UROBILINOGEN 0.2 10/16/2006 2341   NITRITE NEGATIVE 02/08/2022 2318   LEUKOCYTESUR NEGATIVE 02/08/2022 2318    Radiological Exams on Admission: CT ABDOMEN PELVIS W CONTRAST  Result Date: 02/08/2022 CLINICAL DATA:  Abdominal pain EXAM: CT ABDOMEN AND PELVIS WITH CONTRAST TECHNIQUE: Multidetector CT imaging of the abdomen and pelvis was performed using the standard protocol following bolus administration of intravenous contrast. RADIATION DOSE REDUCTION: This exam was performed according to the departmental dose-optimization program which includes automated exposure control, adjustment of the mA and/or kV according to patient size and/or use of iterative reconstruction technique. CONTRAST:  70m OMNIPAQUE IOHEXOL 300 MG/ML  SOLN COMPARISON:  None Available. FINDINGS: Lower chest: There are scattered coronary artery calcifications. Minimal pericardial effusion is seen. Visualized lower lung fields show small linear densities in right middle lobe, lingula and both lower lobes. There is mild elevation of posterior aspects of both hemidiaphragms. Hepatobiliary: No focal abnormalities are seen. There is no dilation of intrahepatic bile ducts. Distal common bile duct in the head of the pancreas measures 7 mm. Gallbladder is unremarkable. Pancreas: There is coarse calcification in tail of pancreas. Spleen: Unremarkable. Adrenals/Urinary Tract: There is mild hyperplasia of left adrenal. There is mild bilateral hydronephrosis. This may be due to  ureteropelvic junction obstruction or caused by distended urinary bladder. Urinary bladder measures 14.9 cm in maximum diameter. There is no wall thickening in the bladder. Stomach/Bowel: Small hiatal hernia is seen. Stomach is not distended. Small bowel loops are not dilated. Appendix is not dilated. There is no significant wall thickening in colon. There is no pericolic stranding. Vascular/Lymphatic: Scattered calcifications are seen in abdominal aorta and its major branches. Reproductive: Lobulations are seen in the margin of uterus, possibly small fibroids with calcification. Other: There is no ascites or pneumoperitoneum. Small umbilical hernia containing fat is seen. Musculoskeletal: Degenerative  changes are noted in lumbar spine at the L5-S1 level. Degenerative changes are noted in both hips. IMPRESSION: There is no evidence of intestinal obstruction or pneumoperitoneum. Appendix is not dilated. There is mild bilateral hydronephrosis, more so on the right side. This may suggest ureteropelvic junction obstruction or related to distention of the urinary bladder. There is marked distention of the bladder measuring up to 14.9 cm in maximum diameter. Minimal pericardial effusion. Aortic arteriosclerosis. There are scattered coronary artery calcifications. Small hiatal hernia. Other findings as described in the body of the report. Electronically Signed   By: Elmer Picker M.D.   On: 02/08/2022 20:40   CT Head Wo Contrast  Result Date: 02/08/2022 CLINICAL DATA:  Altered mental status EXAM: CT HEAD WITHOUT CONTRAST TECHNIQUE: Contiguous axial images were obtained from the base of the skull through the vertex without intravenous contrast. RADIATION DOSE REDUCTION: This exam was performed according to the departmental dose-optimization program which includes automated exposure control, adjustment of the mA and/or kV according to patient size and/or use of iterative reconstruction technique. COMPARISON:   10/17/2006 FINDINGS: Brain: No acute intracranial findings are seen. There are no signs of bleeding within the cranium. Cortical sulci are prominent. There is decreased density in periventricular white matter. Vascular: Unremarkable. Skull: Unremarkable. Sinuses/Orbits: There is mucosal thickening in the ethmoid and right maxillary sinuses. Other: None. IMPRESSION: No acute intracranial findings are seen in noncontrast CT brain. Atrophy. Small-vessel disease. Electronically Signed   By: Elmer Picker M.D.   On: 02/08/2022 18:36    EKG: Independently reviewed.   Assessment/Plan  Acute Metabolic Encephalopathy  -presumed related to infection nos , insetting of fever and prior dx of UTI  -possible medication related (recently started Mybetriq) will hold this medication as patient has urinary retention and mild hydronephrosis -of note ua negative, culture pending  -CTH head negative , MRI pending due to persistent change in MS - to be complete will f/u with CXR, respiratory panel  -monitor on neuro checks -start broad spectrum abx  -f/u on inflammatory markers ,ammonia, vbg   Mild bilateral hydronephrosis  Urinary retention -continue with foley cath  -hold Mybertiq -consider urology consult in am   Hypovolemic ,Hyponatremia -check urine na, serum osmo, tsh to be complete  -continue on ivfs  -repeat Na in 4 hours  -monitor in neuor checks   Hypokalemia -replete prn  -check magnesium level    Anemia nos -repeat h/h  -noted  14.7-11.7 ( baseline around 14)  -partially possibly related to hemoconcentration but noted prior baseline around 14  -no complaints regarding bleeding  -check fob  -monitor h/h  -check anemia labs  -type and cross  -transfuse if less than 7  Mild transaminitis  -possible related to infection / period of hypotension -continue to trend  -CT abd no acute liver findings noted   Leukopenia -possible related to infection/ ? Viral infection  -monitor  count, await for respiratory panel   Hypertension -hypotensive on admit  -hold anti-htn medications    High cholesterol -resume home regimen     Raynaud's -no active issues       DVT prophylaxis: scd due to lower h/h and plt Code Status: full ) Family Communication: n/a Disposition Plan: patient  expected to be admitted greater than 2 midnights  Consults called: GI  Admission status: progressive    Clance Boll MD [facility]   If 7PM-7AM, please contact night-coverage [email or web address] Password [email or web address]  02/09/2022, 12:09 AM

## 2022-02-10 ENCOUNTER — Inpatient Hospital Stay (HOSPITAL_COMMUNITY): Payer: PPO

## 2022-02-10 LAB — BASIC METABOLIC PANEL
Anion gap: 9 (ref 5–15)
Anion gap: 7 (ref 5–15)
BUN: 10 mg/dL (ref 8–23)
BUN: 10 mg/dL (ref 8–23)
CO2: 25 mmol/L (ref 22–32)
CO2: 25 mmol/L (ref 22–32)
Calcium: 7.2 mg/dL — ABNORMAL LOW (ref 8.9–10.3)
Calcium: 7 mg/dL — ABNORMAL LOW (ref 8.9–10.3)
Chloride: 92 mmol/L — ABNORMAL LOW (ref 98–111)
Chloride: 91 mmol/L — ABNORMAL LOW (ref 98–111)
Creatinine, Ser: 0.45 mg/dL (ref 0.44–1.00)
Creatinine, Ser: 0.53 mg/dL (ref 0.44–1.00)
GFR, Estimated: >60 mL/min (ref >60)
GFR, Estimated: >60 mL/min (ref >60)
Glucose, Bld: 120 mg/dL — ABNORMAL HIGH (ref 70–99)
Glucose, Bld: 150 mg/dL — ABNORMAL HIGH (ref 70–99)
Potassium: 3.4 mmol/L — ABNORMAL LOW (ref 3.5–5.1)
Potassium: 3.3 mmol/L — ABNORMAL LOW (ref 3.5–5.1)
Sodium: 126 mmol/L — ABNORMAL LOW (ref 135–145)
Sodium: 123 mmol/L — ABNORMAL LOW (ref 135–145)

## 2022-02-10 LAB — PLATELET COUNT: Platelets: 53 10*3/uL — ABNORMAL LOW (ref 150–400)

## 2022-02-10 LAB — CBC
HCT: 35.2 % — ABNORMAL LOW (ref 36.0–46.0)
Hemoglobin: 12.4 g/dL (ref 12.0–15.0)
MCH: 31.5 pg (ref 26.0–34.0)
MCHC: 35.2 g/dL (ref 30.0–36.0)
MCV: 89.3 fL (ref 80.0–100.0)
Platelets: 13 10*3/uL — CL (ref 150–400)
RBC: 3.94 MIL/uL (ref 3.87–5.11)
RDW: 12.1 % (ref 11.5–15.5)
WBC: 3.8 10*3/uL — ABNORMAL LOW (ref 4.0–10.5)
nRBC: 0 % (ref 0.0–0.2)

## 2022-02-10 LAB — CBG MONITORING, ED: Glucose-Capillary: 123 mg/dL — ABNORMAL HIGH (ref 70–99)

## 2022-02-10 LAB — URINE CULTURE: Culture: NO GROWTH

## 2022-02-10 LAB — MAGNESIUM: Magnesium: 1.9 mg/dL (ref 1.7–2.4)

## 2022-02-10 MED ORDER — CHLORHEXIDINE GLUCONATE CLOTH 2 % EX PADS
6.0000 | MEDICATED_PAD | Freq: Every day | CUTANEOUS | Status: DC
  Administered 2022-02-10 – 2022-02-11 (×2): 6 via TOPICAL

## 2022-02-10 MED ORDER — ORAL CARE MOUTH RINSE: 15.0000 mL | OROMUCOSAL | Status: DC | PRN

## 2022-02-10 MED ORDER — SODIUM CHLORIDE 0.9 % IV SOLN: INTRAVENOUS | Status: DC

## 2022-02-10 MED ORDER — POTASSIUM CHLORIDE 20 MEQ PO PACK
20.0000 meq | PACK | Freq: Two times a day (BID) | ORAL | Status: DC
  Administered 2022-02-10 – 2022-02-11 (×3): 20 meq via ORAL
  Filled 2022-02-10 (×3): qty 1

## 2022-02-10 MED ORDER — SODIUM CHLORIDE 0.9% IV SOLUTION: Freq: Once | INTRAVENOUS | Status: AC

## 2022-02-10 NOTE — ED Notes (Signed)
Blood bank has 2 units of platelets ready for this patient.  Notified I' li, RN.

## 2022-02-10 NOTE — Progress Notes (Signed)
Consultation Progress Note   Patient: Molly Wilcox WPY:[id number] DOB: 01-02-1941 DOA: 02/08/2022 DOS: the patient was seen and examined on 02/10/2022 Primary service: DibiaManfred Shirts, MD  Brief hospital course:  81 y.o. female with medical history significant of Hypertension, High cholesterol, Raynaud's, who has interim history of evaluation at [facility] 02/06/2022 at request of family due to lethargy, poor intake  and concern for dehydration. ON evaluation at that time patient was found to have dehydration with associated hyponatremia that resolved with ivf on repeat labst, as well as UTI for which she was discharged on Keflex. Of note patient  never filled keflex.Patient now returns 3 days later due to family being more concerned about her change in mental status. Patient lives alone and at baseline is able to complete her ADLS/IDLS however over the last 4-5 days patient has note been herself. Per daughter patient also had fever fo 101.5 this afternoon. Patient is more confused and is not at baseline. Per family  described as falling asleep mid-conversation not knowing friends that she sees daily. Patient currently, appears more awake and alert she states she remembers feeling confused and not quite back to her baseline. She denies n/v/d/dysuria/ chest pain or sob.   Assessment and Plan: Acute Metabolic Encephalopathy  -Likely due to UTI, however there was a decline this morning. Repeat CT was negative for acute pathology. No bleeding noted with her ongoing thrombocytopenia.     UTI/cystitis-continue antibiotics follow urine cultures.   Thrombocytopenia-low platelets noted, unclear etiology,  no prior history of thrombocytopenia per patient/chart review. No evidence of bleeding.  Heme/onc evaluated patient and recommended platelet transfusion if less than 20. Would transfuse 2 units and recheck counts this evening.   Hyponatremia-s 126 this morning.  secondary to dehydration.Fluids  restarted this morning.  check Na every 6H   Hypokalemia-replete orally.   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 Transfuse if less than 7. -   Anemia nos -repeat h/h  -noted  14.7-11.7 ( baseline around 14)  -partially possibly related to hemoconcentration but noted prior baseline around 14  -no complaints regarding bleeding  -check fob  -monitor h/h  -check anemia labs  -type and cross  -transfuse if less than 7   Mild transaminitis  -possible related to infection / period of hypotension -continue to trend  -CT abd no acute liver findings noted    Leukopenia -possible related to infection/ ? Viral infection  -monitor count, await for respiratory panel    Hypertension -hypotensive on admit  -hold anti-htn medications     High cholesterol -resume home regimen       Raynaud's -no active issues                 [facility] will continue to follow the patient.  Subjective: Seen this morning, more confused than yesterday. AO x 2, sister at bedside.  Physical Exam: Constitutional: NAD, calm, comfortable  Eyes: PERRL, lids and conjunctivae normal ENMT: Mucous membranes are dry. Posterior pharynx clear of any exudate or lesions.Normal dentition.  Neck: normal, supple, no masses, no thyromegaly Respiratory: clear to auscultation bilaterally, no wheezing, no crackles. Normal respiratory effort. No accessory muscle use.  Cardiovascular: Regular rate and rhythm, no murmurs / rubs / gallops. No extremity edema. 2+ pedal pulses. No carotid bruits.  Abdomen: no tenderness, no masses palpated. No hepatosplenomegaly. Bowel sounds positive.  Musculoskeletal: no clubbing / cyanosis. No joint deformity upper and lower extremities. Good ROM, no contractures. Normal muscle  tone.  Skin: no rashes, lesions, ulcers. No induration Neurologic: CN 2-12 grossly intact. Sensation intact, Strength 5/5 in all 4. AO x 2, unsure of place. Non focal Psychiatric: Normal judgment and insight. Alert and oriented x 3. Normal mood.   Vitals:   02/10/22 0315 02/10/22 0324 02/10/22 0726 02/10/22 1136  BP: (!) 148/68  (!) 154/71 (!) 152/70  Pulse: 90  97 99  Resp: '16  18 18  '$ Temp:  100.1 F (37.8 C) 98.2 F (36.8 C) 98.1 F (36.7 C)  TempSrc:  Oral Oral Oral  SpO2: 94%  97% 97%  Weight:      Height:        Data Reviewed:  There are no new results to review at this time.  Family Communication: Sister  Time spent: 15 minutes.  Author: Cristela Felt, MD 02/10/2022 12:12 PM  For on call review [email or web address].

## 2022-02-11 DIAGNOSIS — R4182 Altered mental status, unspecified: Secondary | ICD-10-CM | POA: Diagnosis not present

## 2022-02-11 LAB — CBC
HCT: 34.7 % — ABNORMAL LOW (ref 36.0–46.0)
Hemoglobin: 12.2 g/dL (ref 12.0–15.0)
MCH: 31.3 pg (ref 26.0–34.0)
MCHC: 35.2 g/dL (ref 30.0–36.0)
MCV: 89 fL (ref 80.0–100.0)
Platelets: 68 10*3/uL — ABNORMAL LOW (ref 150–400)
RBC: 3.9 MIL/uL (ref 3.87–5.11)
RDW: 12.5 % (ref 11.5–15.5)
WBC: 6.2 10*3/uL (ref 4.0–10.5)
nRBC: 0 % (ref 0.0–0.2)

## 2022-02-11 LAB — COMPREHENSIVE METABOLIC PANEL
ALT: 76 U/L — ABNORMAL HIGH (ref 0–44)
AST: 82 U/L — ABNORMAL HIGH (ref 15–41)
Albumin: 2.7 g/dL — ABNORMAL LOW (ref 3.5–5.0)
Alkaline Phosphatase: 71 U/L (ref 38–126)
Anion gap: 9 (ref 5–15)
BUN: 9 mg/dL (ref 8–23)
CO2: 26 mmol/L (ref 22–32)
Calcium: 7.5 mg/dL — ABNORMAL LOW (ref 8.9–10.3)
Chloride: 100 mmol/L (ref 98–111)
Creatinine, Ser: 0.47 mg/dL (ref 0.44–1.00)
GFR, Estimated: >60 mL/min (ref >60)
Glucose, Bld: 114 mg/dL — ABNORMAL HIGH (ref 70–99)
Potassium: 3.5 mmol/L (ref 3.5–5.1)
Sodium: 135 mmol/L (ref 135–145)
Total Bilirubin: 0.8 mg/dL (ref 0.3–1.2)
Total Protein: 5.5 g/dL — ABNORMAL LOW (ref 6.5–8.1)

## 2022-02-11 LAB — HOMOCYSTEINE: Homocysteine: 6.2 umol/L (ref 0.0–21.3)

## 2022-02-11 MED ORDER — AZITHROMYCIN 250 MG PO TABS
500.0000 mg | ORAL_TABLET | Freq: Every day | ORAL | Status: AC
  Administered 2022-02-11: 500 mg via ORAL
  Filled 2022-02-11: qty 2

## 2022-02-11 MED ORDER — SIMVASTATIN 10 MG PO TABS: 10.0000 mg | ORAL_TABLET | Freq: Every day | ORAL | Status: DC

## 2022-02-11 MED ORDER — AMLODIPINE BESYLATE 10 MG PO TABS
10.0000 mg | ORAL_TABLET | Freq: Every day | ORAL | Status: DC
  Administered 2022-02-11: 10 mg via ORAL
  Filled 2022-02-11: qty 1

## 2022-02-11 MED ORDER — VITAMIN C 500 MG PO TABS
500.0000 mg | ORAL_TABLET | Freq: Every day | ORAL | Status: DC
  Administered 2022-02-11: 500 mg via ORAL
  Filled 2022-02-11: qty 1

## 2022-02-11 MED ORDER — AZITHROMYCIN 250 MG PO TABS: 250.0000 mg | ORAL_TABLET | Freq: Every day | ORAL | Status: DC

## 2022-02-11 NOTE — Discharge Summary (Signed)
Physician Discharge Summary   Patient: Molly Wilcox MRN: [id number] DOB: 06-11-1940  Admit date:     02/08/2022  Discharge date: 02/11/22  Discharge Physician: Arnetha Courser   PCP: [place], Betty G, MD   Recommendations at discharge:  Please obtain CBC and BMP in 1 week Follow-up with primary care provider Follow-up with urology Follow-up with oncology  Discharge Diagnoses: Principal Problem:   Change in mental status Active Problems:   Thrombocytopenia [facility])  Hospital Course: 81 y.o. female with medical history significant of Hypertension, High cholesterol, Raynaud's, who has interim history of evaluation at [facility] 02/06/2022 at request of family due to lethargy, poor intake  and concern for dehydration. ON evaluation at that time patient was found to have dehydration with associated hyponatremia that resolved with ivf on repeat labst, as well as UTI for which she was discharged on Keflex. Of note patient  never filled keflex.Patient now returns 3 days later due to family being more concerned about her change in mental status. Patient lives alone and at baseline is able to complete her ADLS/IDLS however over the last 4-5 days patient has note been herself. Per daughter patient also had fever fo 101.5 this afternoon. Patient is more confused and is not at baseline.   Patient was admitted for concern of acute metabolic encephalopathy, initial concern was secondary to UTI.  Urine and blood cultures were negative and she received 5 days of antibiotics. Patient received ceftriaxone and Zithromax for 3 days and no antibiotics were continued due to negative cultures.  Chest x-ray with concern of pneumonia, most likely atelectasis as there was no significant upper respiratory symptoms.  She was found to have mild bilateral hydronephrosis with urinary retention and distended bladder and a Foley catheter was placed on admission.  Foley catheter was removed before discharge and she was able  to void with no significant postvoid volume.  She was also found to have acute onset thrombocytopenia of unclear etiology, most likely secondary to infection.  Platelet nadir at 13 and then started improving.  Oncology was also consulted and she received 2 unit of platelets.  Platelets at 68 on discharge.  Oncology would like to follow-up as an outpatient to see the upward trend.  She also developed some leukopenia most likely secondary to some viral infection, count improved.    Patient had mild transaminitis and some electrolyte abnormalities due to dehydration which were repleted before discharge.  Liver function improved.  Patient otherwise remained stable, physical therapy evaluated her and no recommendations or follow-up needed.  Patient will continue on her current medications and need to have a close follow-up with her providers for further recommendations.  Consultants: Hematology Procedures performed: None Disposition: Home Diet recommendation:  Discharge Diet Orders (From admission, onward)     Start     Ordered   02/11/22 0000  Diet - low sodium heart healthy        02/11/22 1642           Regular diet DISCHARGE MEDICATION: Allergies as of 02/11/2022       Reactions   Prednisone Other (See Comments)   Dehydration, amnesia   Ciprofloxacin Other (See Comments)   GI issues   Ibuprofen Nausea Only, Other (See Comments)   Nausea & headaches        Medication List     STOP taking these medications    cephALEXin 500 MG capsule Commonly known as: KEFLEX   clobetasol cream 0.05 % Commonly known as:  TEMOVATE       TAKE these medications    amLODipine 10 MG tablet Commonly known as: NORVASC TAKE 1 TABLET BY MOUTH EVERY DAY   aspirin EC 81 MG tablet Take 81 mg by mouth daily.   Calcium Gummies 250-100-500 MG-MG-UNIT Chew Generic drug: Calcium-Phosphorus-Vitamin D Chew 2 tablets by mouth daily.   lisinopril-hydrochlorothiazide 20-25 MG  tablet Commonly known as: ZESTORETIC TAKE 1 TABLET BY MOUTH EVERY DAY   MAGNESIUM OXIDE PO Take 0.5 tablets by mouth daily.   MIRALAX PO Take 17 g by mouth as needed (constipation).   Myrbetriq 50 MG Tb24 tablet Generic drug: mirabegron ER Take 50 mg by mouth daily.   risedronate 35 MG tablet Commonly known as: Actonel Take 1 tablet (35 mg total) by mouth every 7 (seven) days. with water on empty stomach, nothing by mouth or lie down for next 30 minutes.   simvastatin 10 MG tablet Commonly known as: ZOCOR TAKE 1 TABLET BY MOUTH EVERYDAY AT BEDTIME What changed: See the new instructions.   TUMS PO Take 2 tablets by mouth daily as needed (heartburn).   Vitamin B-12 5000 MCG Tbdp Take 5,000 Units by mouth every 3 (three) days.   Vitamin C 250 MG Chew Chew 250 mg by mouth daily.               Durable Medical Equipment  (From admission, onward)           Start     Ordered   02/11/22 1219  For home use only DME Walker rolling  Once       Question Answer Comment  Walker: With 5 Inch Wheels   Patient needs a walker to treat with the following condition Difficulty in walking, not elsewhere classified      02/11/22 1219            Follow-up Information     [place], Betty G, MD. Schedule an appointment as soon as possible for a visit in 1 week(s).   Specialty: Family Medicine Contact information: [address] [place] [address] [phone]                Discharge Exam: Ceasar Mons Weights   02/08/22 1616 02/11/22 0500  Weight: 54.5 kg 54.8 kg   General.  Frail elderly lady, in no acute distress. Pulmonary.  Lungs clear bilaterally, normal respiratory effort. CV.  Regular rate and rhythm, no JVD, rub or murmur. Abdomen.  Soft, nontender, nondistended, BS positive. CNS.  Alert and oriented .  No focal neurologic deficit. Extremities.  No edema, no cyanosis, pulses intact and symmetrical. Psychiatry.  Judgment and insight appears  normal.   Condition at discharge: stable  The results of significant diagnostics from this hospitalization (including imaging, microbiology, ancillary and laboratory) are listed below for reference.   Imaging Studies: CT HEAD WO CONTRAST ( )  Result Date: 02/10/2022 CLINICAL DATA:  Altered mental status. EXAM: CT HEAD WITHOUT CONTRAST TECHNIQUE: Contiguous axial images were obtained from the base of the skull through the vertex without intravenous contrast. RADIATION DOSE REDUCTION: This exam was performed according to the departmental dose-optimization program which includes automated exposure control, adjustment of the mA and/or kV according to patient size and/or use of iterative reconstruction technique. COMPARISON:  MRI Brain 02/09/22, CT head 02/08/22 FINDINGS: Brain: No evidence of acute infarction, hemorrhage, hydrocephalus, extra-axial collection or mass lesion/mass effect. There is sequela of severe chronic microvascular ischemic change. Vascular: No hyperdense vessel or unexpected calcification. Skull: Normal. Negative  for fracture or focal lesion. Sinuses/Orbits: No acute finding. Other: None. IMPRESSION: No acute intracranial abnormality. Electronically Signed   By: Lorenza Cambridge M.D.   On: 02/10/2022 10:15   MR BRAIN WO CONTRAST  Result Date: 02/09/2022 CLINICAL DATA:  81 year old female altered mental status, confusion. EXAM: MRI HEAD WITHOUT CONTRAST TECHNIQUE: Multiplanar, multiecho pulse sequences of the brain and surrounding structures were obtained without intravenous contrast. COMPARISON:  Head CT yesterday. FINDINGS: Brain: No restricted diffusion to suggest acute infarction. No midline shift, mass effect, evidence of mass lesion, ventriculomegaly, extra-axial collection or acute intracranial hemorrhage. Cervicomedullary junction and pituitary are within normal limits. Cerebral volume is within normal limits for age. Largely normal for age gray and white matter signal throughout  the brain also; mild for age scattered nonspecific white matter T2 and FLAIR hyperintensity. No cortical encephalomalacia or chronic cerebral blood products identified. Questionable left cerebellar malacia on series 9, image 11, but favor artifact instead as not correlated on coronal. Deep gray nuclei and brainstem are within normal limits. Vascular: Major intracranial vascular flow voids are preserved. Skull and upper cervical spine: Normal for age visible cervical spine. Visualized bone marrow signal is within normal limits. Sinuses/Orbits: Orbits are within normal limits. Paranasal sinuses and mastoids are stable and well aerated. Other: Visible internal auditory structures appear normal. Negative visible scalp and face. IMPRESSION: No acute intracranial abnormality and essentially normal for age noncontrast MRI appearance of the brain. Electronically Signed   By: Odessa Fleming M.D.   On: 02/09/2022 06:32   DG Chest Portable 1 View  Result Date: 02/09/2022 CLINICAL DATA:  Fever, altered mental status. EXAM: PORTABLE CHEST 1 VIEW COMPARISON:  None Available. FINDINGS: The heart size and mediastinal contours are within normal limits. There is atherosclerotic calcification of the aorta. Hyperinflation of the lungs is noted. Minimal atelectasis is seen at the right lung base. No effusion or pneumothorax. No acute osseous abnormality. IMPRESSION: Hyperinflation of the lungs with mild atelectasis at the right lung base. Electronically Signed   By: Thornell Sartorius M.D.   On: 02/09/2022 00:27   CT ABDOMEN PELVIS W CONTRAST  Result Date: 02/08/2022 CLINICAL DATA:  Abdominal pain EXAM: CT ABDOMEN AND PELVIS WITH CONTRAST TECHNIQUE: Multidetector CT imaging of the abdomen and pelvis was performed using the standard protocol following bolus administration of intravenous contrast. RADIATION DOSE REDUCTION: This exam was performed according to the departmental dose-optimization program which includes automated exposure  control, adjustment of the mA and/or kV according to patient size and/or use of iterative reconstruction technique. CONTRAST:  80mL OMNIPAQUE IOHEXOL 300 MG/ML  SOLN COMPARISON:  None Available. FINDINGS: Lower chest: There are scattered coronary artery calcifications. Minimal pericardial effusion is seen. Visualized lower lung fields show small linear densities in right middle lobe, lingula and both lower lobes. There is mild elevation of posterior aspects of both hemidiaphragms. Hepatobiliary: No focal abnormalities are seen. There is no dilation of intrahepatic bile ducts. Distal common bile duct in the head of the pancreas measures 7 mm. Gallbladder is unremarkable. Pancreas: There is coarse calcification in tail of pancreas. Spleen: Unremarkable. Adrenals/Urinary Tract: There is mild hyperplasia of left adrenal. There is mild bilateral hydronephrosis. This may be due to ureteropelvic junction obstruction or caused by distended urinary bladder. Urinary bladder measures 14.9 cm in maximum diameter. There is no wall thickening in the bladder. Stomach/Bowel: Small hiatal hernia is seen. Stomach is not distended. Small bowel loops are not dilated. Appendix is not dilated. There is no significant wall thickening  in colon. There is no pericolic stranding. Vascular/Lymphatic: Scattered calcifications are seen in abdominal aorta and its major branches. Reproductive: Lobulations are seen in the margin of uterus, possibly small fibroids with calcification. Other: There is no ascites or pneumoperitoneum. Small umbilical hernia containing fat is seen. Musculoskeletal: Degenerative changes are noted in lumbar spine at the L5-S1 level. Degenerative changes are noted in both hips. IMPRESSION: There is no evidence of intestinal obstruction or pneumoperitoneum. Appendix is not dilated. There is mild bilateral hydronephrosis, more so on the right side. This may suggest ureteropelvic junction obstruction or related to distention  of the urinary bladder. There is marked distention of the bladder measuring up to 14.9 cm in maximum diameter. Minimal pericardial effusion. Aortic arteriosclerosis. There are scattered coronary artery calcifications. Small hiatal hernia. Other findings as described in the body of the report. Electronically Signed   By: Ernie Avena M.D.   On: 02/08/2022 20:40   CT Head Wo Contrast  Result Date: 02/08/2022 CLINICAL DATA:  Altered mental status EXAM: CT HEAD WITHOUT CONTRAST TECHNIQUE: Contiguous axial images were obtained from the base of the skull through the vertex without intravenous contrast. RADIATION DOSE REDUCTION: This exam was performed according to the departmental dose-optimization program which includes automated exposure control, adjustment of the mA and/or kV according to patient size and/or use of iterative reconstruction technique. COMPARISON:  10/17/2006 FINDINGS: Brain: No acute intracranial findings are seen. There are no signs of bleeding within the cranium. Cortical sulci are prominent. There is decreased density in periventricular white matter. Vascular: Unremarkable. Skull: Unremarkable. Sinuses/Orbits: There is mucosal thickening in the ethmoid and right maxillary sinuses. Other: None. IMPRESSION: No acute intracranial findings are seen in noncontrast CT brain. Atrophy. Small-vessel disease. Electronically Signed   By: Ernie Avena M.D.   On: 02/08/2022 18:36    Microbiology: Results for orders placed or performed during the hospital encounter of 02/08/22  Blood culture (routine x 2)     Status: None (Preliminary result)   Collection Time: 02/08/22  6:20 PM   Specimen: BLOOD  Result Value Ref Range Status   Specimen Description   Final    BLOOD SITE NOT SPECIFIED Performed at [facility], [address], [place], [place] [address]    Special Requests   Final    BOTTLES DRAWN AEROBIC AND ANAEROBIC Blood Culture results may not be optimal due  to an excessive volume of blood received in culture bottles Performed at [facility], [address], [place], [place] [address]    Culture   Final    NO GROWTH 3 DAYS Performed at [facility], [address] [place], [place] [address]    Report Status PENDING  Incomplete  Blood culture (routine x 2)     Status: None (Preliminary result)   Collection Time: 02/08/22  6:30 PM   Specimen: BLOOD  Result Value Ref Range Status   Specimen Description   Final    BLOOD SITE NOT SPECIFIED Performed at [facility], [address], [place], [place] [address]    Special Requests   Final    BOTTLES DRAWN AEROBIC AND ANAEROBIC Blood Culture adequate volume Performed at [facility], [address], [place], [place] [address]    Culture   Final    NO GROWTH 3 DAYS Performed at [facility], [address], [place], [place] [address]    Report Status PENDING  Incomplete  Urine Culture  Status: None   Collection Time: 02/08/22 11:18 PM   Specimen: Urine, Clean Catch  Result Value Ref Range Status   Specimen Description   Final    URINE, CLEAN CATCH Performed at [facility], [address], [place], [place] [address]    Special Requests   Final    NONE Performed at [facility], [address], [place], [place] [address]    Culture   Final    NO GROWTH Performed at [facility], [address], [place], [place] [address]    Report Status 02/10/2022 FINAL  Final  Resp Panel by RT-PCR (Flu A&B, Covid) Anterior Nasal Swab     Status: None   Collection Time: 02/09/22 12:26 AM   Specimen: Anterior Nasal Swab  Result Value Ref Range Status   SARS Coronavirus 2 by RT PCR NEGATIVE NEGATIVE Final    Comment: (NOTE) SARS-CoV-2 target nucleic acids are NOT DETECTED.  The SARS-CoV-2 RNA is generally detectable in upper respiratory specimens during the acute phase of infection.  The lowest concentration of SARS-CoV-2 viral copies this assay can detect is 138 copies/mL. A negative result does not preclude SARS-Cov-2 infection and should not be used as the sole basis for treatment or other patient management decisions. A negative result may occur with  improper specimen collection/handling, submission of specimen other than nasopharyngeal swab, presence of viral mutation(s) within the areas targeted by this assay, and inadequate number of viral copies(<138 copies/mL). A negative result must be combined with clinical observations, patient history, and epidemiological information. The expected result is Negative.  Fact Sheet for Patients:  BloggerCourse.com  Fact Sheet for Healthcare Providers:  SeriousBroker.it  This test is no t yet approved or cleared by the [place] FDA and  has been authorized for detection and/or diagnosis of SARS-CoV-2 by FDA under an Emergency Use Authorization (EUA). This EUA will remain  in effect (meaning this test can be used) for the duration of the COVID-19 declaration under Section 564(b)(1) of the Act, 21 U.S.C.section 360bbb-3(b)(1), unless the authorization is terminated  or revoked sooner.       Influenza A by PCR NEGATIVE NEGATIVE Final   Influenza B by PCR NEGATIVE NEGATIVE Final    Comment: (NOTE) The Xpert Xpress SARS-CoV-2/FLU/RSV plus assay is intended as an aid in the diagnosis of influenza from Nasopharyngeal swab specimens and should not be used as a sole basis for treatment. Nasal washings and aspirates are unacceptable for Xpert Xpress SARS-CoV-2/FLU/RSV testing.  Fact Sheet for Patients: BloggerCourse.com  Fact Sheet for Healthcare Providers: SeriousBroker.it  This test is not yet approved or cleared by the [place] FDA and has been authorized for detection and/or diagnosis of SARS-CoV-2 by FDA  under an Emergency Use Authorization (EUA). This EUA will remain in effect (meaning this test can be used) for the duration of the COVID-19 declaration under Section 564(b)(1) of the Act, 21 U.S.C. section 360bbb-3(b)(1), unless the authorization is terminated or revoked.  Performed at [facility], [address] [place], [place] [address]     Labs: CBC: Recent Labs  Lab 02/06/22 1449 02/08/22 1755 02/09/22 0225 02/10/22 0630 02/10/22 1909 02/11/22 0444  WBC 5.4 2.7* 2.5* 3.8*  --  6.2  NEUTROABS 4.6 2.2  --   --   --   --   HGB 14.7 11.7* 12.5 12.4  --  12.2  HCT 42.2 33.4* 35.4* 35.2*  --  34.7*  MCV 91.7 89.5 90.3  89.3  --  89.0  PLT 103* 33* 26* 13* 53* 68*   Basic Metabolic Panel: Recent Labs  Lab 02/08/22 1755 02/09/22 0225 02/10/22 0630 02/10/22 1620 02/11/22 0444  NA 127* 132* 126* 123* 135  K 2.2* 3.4* 3.4* 3.3* 3.5  CL 93* 102 92* 91* 100  CO2 24 23 25 25 26   GLUCOSE 149* 118* 120* 150* 114*  BUN 16 13 10 10 9   CREATININE 0.64 0.51 0.45 0.53 0.47  CALCIUM 7.3* 7.2* 7.2* 7.0* 7.5*  MG  --   --  1.9  --   --    Liver Function Tests: Recent Labs  Lab 02/06/22 1449 02/08/22 1755 02/09/22 0225 02/11/22 0444  AST 31 62* 62* 82*  ALT 25 45* 49* 76*  ALKPHOS 38 30* 35* 71  BILITOT 0.6 1.0 0.9 0.8  PROT 7.6 5.5* 5.8* 5.5*  ALBUMIN 4.7 3.1* 2.9* 2.7*   CBG: Recent Labs  Lab 02/10/22 0755  GLUCAP 123*    Discharge time spent: greater than 30 minutes.  This record has been created using Conservation officer, historic buildings. Errors have been sought and corrected,but may not always be located. Such creation errors do not reflect on the standard of care.   Signed: Arnetha Courser, MD [facility] 02/11/2022

## 2022-02-11 NOTE — TOC Initial Note (Signed)
Transition of Care [facility]) - Initial/Assessment Note    Patient Details  Name: Molly Wilcox MRN: [id number] Date of Birth: [birth year]  Transition of Care [facility] CM/SW Contact:    Henrietta Dine, RN Phone Number: 02/11/2022, 3:57 PM  Clinical Narrative:                 Order received for RW per PT eval; called pt in room; she says she is from home and plans to return at d/c; the pt says she has transportation home; she also says she wears glasses but does not have dentures or hearing aids; the pt agrees to RW and does not have an agency preference; contacted [place] at [place] and the walker will be delivered to the room; awaiting OT eval.  Expected Discharge Plan: Home/Self Care Barriers to Discharge: No Barriers Identified   Patient Goals and CMS Choice Patient states their goals for this hospitalization and ongoing recovery are:: home      Expected Discharge Plan and Services Expected Discharge Plan: Home/Self Care                         DME Arranged: Walker rolling DME Agency: [employer] Date DME Agency Contacted: 02/11/22 Time DME Agency Contacted: 63 Representative spoke with at DME Agency: Delana Meyer            Prior Living Arrangements/Services   Lives with:: Self Patient language and need for interpreter reviewed:: Yes Do you feel safe going back to the place where you live?: Yes      Need for Family Participation in Patient Care: Yes (Comment) Care giver support system in place?: Yes (comment)   Criminal Activity/Legal Involvement Pertinent to Current Situation/Hospitalization: No - Comment as needed  Activities of Daily Living Home Assistive Devices/Equipment: None ADL Screening (condition at time of admission) Patient's cognitive ability adequate to safely complete daily activities?: No Is the patient deaf or have difficulty hearing?: No Does the patient have difficulty seeing, even when wearing glasses/contacts?: No Does the patient  have difficulty concentrating, remembering, or making decisions?: Yes Patient able to express need for assistance with ADLs?: Yes Does the patient have difficulty dressing or bathing?: Yes Independently performs ADLs?: No Communication: Independent Dressing (OT): Needs assistance Is this a change from baseline?: Change from baseline, expected to last <3days Grooming: Needs assistance Is this a change from baseline?: Change from baseline, expected to last <3 days Feeding: Independent Bathing: Needs assistance Is this a change from baseline?: Change from baseline, expected to last <3 days Toileting: Needs assistance Is this a change from baseline?: Change from baseline, expected to last <3 days In/Out Bed: Needs assistance Is this a change from baseline?: Change from baseline, expected to last <3 days Walks in Home: Needs assistance Is this a change from baseline?: Change from baseline, expected to last <3 days Does the patient have difficulty walking or climbing stairs?: No Weakness of Legs: None Weakness of Arms/Hands: None  Permission Sought/Granted Permission sought to share information with : Case Manager Permission granted to share information with : Yes, Verbal Permission Granted  Share Information with NAME: Lenor Coffin, RN, CM           Emotional Assessment   Attitude/Demeanor/Rapport: Gracious Affect (typically observed): Accepting Orientation: : Oriented to Self, Oriented to Place, Oriented to  Time, Oriented to Situation Alcohol / Substance Use: Not Applicable Psych Involvement: No (comment)  Admission diagnosis:  Hypokalemia [E87.6] Urinary retention [R33.9] Change in mental  status [R41.82] Hydronephrosis, unspecified hydronephrosis type [N13.30] Altered mental status, unspecified altered mental status type [R41.82] Patient Active Problem List   Diagnosis Date Noted   Change in mental status 02/09/2022   Thrombocytopenia ([place]) 02/09/2022   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

## 2022-02-11 NOTE — Evaluation (Signed)
Physical Therapy Evaluation Patient Details Name: Molly Wilcox MRN: [id number] DOB: [birth year] Today's Date: 02/11/2022  History of Present Illness  81 y.o. female  evaluation at [facility] 02/06/2022 at request of family due to lethargy, poor intake and concern for dehydration.  adm with  acute Metabolic Encephalopathy, Likely due to UTI.  CT was negative for acute pathology  PMH: Hypertension, High cholesterol, Raynaud's  Clinical Impression  Pt admitted with above diagnosis.  Pt very independent at her baseline. Amb ~ 79' with RW and min/guard. Reliant on RW for balance and safety at this time however anticipate she will not need it long term.  Pt has 24 hour care for the next week or so.   Pt currently with functional limitations due to the deficits listed below (see PT Problem List). Pt will benefit from skilled PT to increase their independence and safety with mobility to allow discharge to the venue listed below.          Recommendations for follow up therapy are one component of a multi-disciplinary discharge planning process, led by the attending physician.  Recommendations may be updated based on patient status, additional functional criteria and insurance authorization.  Follow Up Recommendations No PT follow up      Assistance Recommended at Discharge Frequent or constant Supervision/Assistance  Patient can return home with the following  A little help with walking and/or transfers;A little help with bathing/dressing/bathroom;Assist for transportation;Help with stairs or ramp for entrance;Assistance with cooking/housework    Equipment Recommendations Rolling walker (2 wheels)  Recommendations for Other Services       Functional Status Assessment Patient has had a recent decline in their functional status and demonstrates the ability to make significant improvements in function in a reasonable and predictable amount of time.     Precautions / Restrictions  Precautions Precautions: Fall Restrictions Weight Bearing Restrictions: No      Mobility  Bed Mobility Overal bed mobility: Needs Assistance Bed Mobility: Supine to Sit     Supine to sit: Min guard     General bed mobility comments: incr time, min/guard for safety    Transfers Overall transfer level: Needs assistance Equipment used: Rolling walker (2 wheels) Transfers: Sit to/from Stand Sit to Stand: Min guard           General transfer comment: cues for hand placment, min/guard for safety    Ambulation/Gait Ambulation/Gait assistance: Min guard Gait Distance (Feet): 80 Feet Assistive device: Rolling walker (2 wheels) Gait Pattern/deviations: Step-through pattern, Decreased stride length       General Gait Details: cues for RW position and upward gaze. unsteady initially however improved stability with incr distance. no overt LOB  Stairs            Wheelchair Mobility    Modified Rankin (Stroke Patients Only)       Balance Overall balance assessment: Needs assistance Sitting-balance support: Feet supported, No upper extremity supported Sitting balance-Leahy Scale: Fair     Standing balance support: Reliant on assistive device for balance, During functional activity Standing balance-Leahy Scale: Fair Standing balance comment: reliant on device for dynamic tasks                             Pertinent Vitals/Pain Pain Assessment Pain Assessment: No/denies pain    Home Living Family/patient expects to be discharged to:: Private residence Living Arrangements: Alone Available Help at Discharge: Family Type of Home: House Home Access:  Ramped entrance       Home Layout: One level Home Equipment: None      Prior Function Prior Level of Function : Independent/Modified Independent             Mobility Comments: very IND, just hiked the [employer] with her sister       Hand Dominance        Extremity/Trunk  Assessment   Upper Extremity Assessment Upper Extremity Assessment: Overall WFL for tasks assessed    Lower Extremity Assessment Lower Extremity Assessment: Overall WFL for tasks assessed       Communication   Communication: No difficulties  Cognition Arousal/Alertness: Awake/alert Behavior During Therapy: WFL for tasks assessed/performed Overall Cognitive Status: Within Functional Limits for tasks assessed                                 General Comments: slightly slower processing than normal for pt; appropriate and answers all questions accurately        General Comments      Exercises     Assessment/Plan    PT Assessment Patient needs continued PT services  PT Problem List Decreased strength;Decreased activity tolerance;Decreased mobility;Decreased balance       PT Treatment Interventions DME instruction;Therapeutic exercise;Gait training;Functional mobility training;Therapeutic activities;Patient/family education;Balance training    PT Goals (Current goals can be found in the Care Plan section)  Acute Rehab PT Goals Patient Stated Goal: get back to independence PT Goal Formulation: With patient/family Time For Goal Achievement: 02/25/22 Potential to Achieve Goals: Good    Frequency Min 3X/week     Co-evaluation               AM-PAC PT "6 Clicks" Mobility  Outcome Measure Help needed turning from your back to your side while in a flat bed without using bedrails?: A Little Help needed moving from lying on your back to sitting on the side of a flat bed without using bedrails?: A Little Help needed moving to and from a bed to a chair (including a wheelchair)?: A Little Help needed standing up from a chair using your arms (e.g., wheelchair or bedside chair)?: A Little Help needed to walk in hospital room?: A Little Help needed climbing 3-5 steps with a railing? : A Little 6 Click Score: 18    End of Session Equipment Utilized During  Treatment: Gait belt Activity Tolerance: Patient tolerated treatment well Patient left: in bed Nurse Communication: Mobility status PT Visit Diagnosis: Other abnormalities of gait and mobility (R26.89);Difficulty in walking, not elsewhere classified (R26.2)    Time: 2202-5427 PT Time Calculation (min) (ACUTE ONLY): 18 min   Charges:   PT Evaluation $PT Eval Low Complexity: [place], PT  Acute Rehab Dept [facility]) [phone]  [facility] Weekend Pager [facility] only)  [phone]  02/11/2022   [facility] 02/11/2022, 12:13 PM

## 2022-02-13 ENCOUNTER — Telehealth: Payer: Self-pay

## 2022-02-13 LAB — CULTURE, BLOOD (ROUTINE X 2)
Culture: NO GROWTH
Culture: NO GROWTH
Special Requests: ADEQUATE

## 2022-02-13 LAB — PREPARE PLATELET PHERESIS
Unit division: 0
Unit division: 0

## 2022-02-13 LAB — BPAM PLATELET PHERESIS
Blood Product Expiration Date: 202311122359
Blood Product Expiration Date: 202311132359
ISSUE DATE / TIME: 202311101324
ISSUE DATE / TIME: 202311102043
Unit Type and Rh: 600
Unit Type and Rh: 6200

## 2022-02-13 LAB — METHYLMALONIC ACID, SERUM: Methylmalonic Acid, Quantitative: 111 nmol/L (ref 0–378)

## 2022-02-13 NOTE — Telephone Encounter (Signed)
Transition Care Management Follow-up Telephone Call Date of discharge and from where: [facility] 02-11-22 Dx: change in mental status How have you been since you were released from the hospital? Getting better Any questions or concerns? No  Items Reviewed: Did the pt receive and understand the discharge instructions provided? Yes  Medications obtained and verified? Yes  Other? No  Any new allergies since your discharge? No  Dietary orders reviewed? Yes Do you have support at home? Yes   Home Care and Equipment/Supplies: Were home health services ordered? no If so, what is the name of the agency? na  Has the agency set up a time to come to the patient's home? not applicable Were any new equipment or medical supplies ordered?  Yes rolling walker  What is the name of the medical supply agency? Hospital  Were you able to get the supplies/equipment? yes Do you have any questions related to the use of the equipment or supplies? No  Functional Questionnaire: (I = Independent and D = Dependent) ADLs: I  Bathing/Dressing- I  Meal Prep- I  Eating- I  Maintaining continence- I  Transferring/Ambulation- I-WALKER   Managing Meds- I  Follow up appointments reviewed:  PCP Hospital f/u appt confirmed? Yes  Scheduled to see Dr Martinique on 02-14-22 @ [facility] f/u appt confirmed? No . Are transportation arrangements needed? No  If their condition worsens, is the pt aware to call PCP or go to the Emergency Dept.? Yes Was the patient provided with contact information for the PCP's office or ED? Yes Was to pt encouraged to call back with questions or concerns? Yes   Juanda Crumble LPN [facility] Direct Dial [phone]

## 2022-02-13 NOTE — Progress Notes (Unsigned)
HPI: Molly Wilcox is a 81 y.o. female with past medical history significant for hypertension, osteoporosis, hyperlipidemia, Raynaud's, hyperlipidemia, vitamin D deficiency here today  with her daughter to follow on recent hospitalization. Hospitalized from 02/08/2022 to 02/11/2022. TOC call on 02/13/22.  She presented to the ED via EMS on date of hospitalization with concerns about lethargy, poor oral intake, and dehydration.  She had been previously evaluated at [facility] on 02/06/22 diagnosed with UTI, Keflex was prescribed, she took 2 tablets before going back to the ED on 02/08/2022. She was also having temp 101.5 F and mild cough.  Ucx and Bcx negative. Respiratory panel negative. Brain MRI on 02/09/2022: No acute intracranial abnormality and essentially normal for age. Head CT on 02/10/2022: No acute intracranial abnormality. CXR 02/09/22: Hyperinflation of the lungs with mild atelectasis at the right lung base.  She was found to have mild bilateral hydronephrosis with urinary retention and distended bladder and a Foley catheter was placed on admission.  Foley catheter was removed before discharge and she is not reporting any problem with urination. 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 Her last visit on 09/12/21, Dr Matilde Sprang. She was instructed to arrange f/u appt.  Hypertension: She is on amlodipine 10 mg daily and lisinopril-HCTZ 20-25 mg daily. She is checking BP at home, 110s-120s/60s to 70s.  Occasionally SBP in the low tens and DBP in the 50s.  Lab Results  Component Value Date   CREATININE 0.47 02/11/2022   BUN 9 02/11/2022   NA 135 02/11/2022   K 3.5 02/11/2022   CL 100 02/11/2022   CO2 26 02/11/2022   Thrombocytopenia ,she denies any abnormal bleeding.She is still on Aspirin 81 mg daily. Oncology consultation during hospitalization, she received 2 units of platelets. She was instructed to call hematologist office to  arrange follow-up appointment, her daughter did and has not received a call back.     Latest Ref Rng & Units 02/11/2022    4:44 AM 02/10/2022    7:09 PM 02/10/2022    6:30 AM  CBC  WBC 4.0 - 10.5 K/uL 6.2   3.8   Hemoglobin 12.0 - 15.0 g/dL 12.2   12.4   Hematocrit 36.0 - 46.0 % 34.7   35.2   Platelets 150 - 400 K/uL 68  53  13    Lab Results  Component Value Date   ALT 76 (H) 02/11/2022   AST 82 (H) 02/11/2022   ALKPHOS 71 02/11/2022   BILITOT 0.8 02/11/2022   HH services were not deemed necessary. She is using a walker for transfer. She needs assistance with some ADLs. Her daughter is staying with her until next week, family members are taking turns to be with her 24/7.  Her daughter reports that the patient is slowly improving but is not yet at her baseline.  The patient's aunt is concerned about the patient's ability to find words.  This is something that has been noticed for the past few months.  Negative for memory difficulties.  Her daughter is also concerned about hearing loss, which has been going on for a while but seems to be getting worse.  Negative for earache.  Review of Systems  Constitutional:  Positive for activity change, appetite change and fatigue. Negative for fever.  HENT:  Negative for mouth sores and nosebleeds.   Respiratory:  Negative for cough, shortness of breath and wheezing.   Cardiovascular:  Negative for chest pain, palpitations and leg swelling.  Gastrointestinal:  Negative for abdominal pain, nausea and vomiting.       Negative for changes in bowel habits.  Genitourinary:  Negative for decreased urine volume, dysuria and hematuria.  Musculoskeletal:  Positive for gait problem.  Neurological:  Negative for syncope and headaches.  See other pertinent positives and negatives in HPI.  Current Outpatient Medications on File Prior to Visit  Medication Sig Dispense Refill   amLODipine (NORVASC) 10 MG tablet TAKE 1 TABLET BY MOUTH EVERY DAY  (Patient taking differently: Take 10 mg by mouth daily.) 90 tablet 3   Ascorbic Acid (VITAMIN C) 250 MG CHEW Chew 250 mg by mouth daily.     aspirin EC 81 MG tablet Take 81 mg by mouth daily.     Calcium Carbonate Antacid (TUMS PO) Take 2 tablets by mouth daily as needed (heartburn).     Calcium-Phosphorus-Vitamin D (CALCIUM GUMMIES) [phone] MG-MG-UNIT CHEW Chew 2 tablets by mouth daily.     Cyanocobalamin (VITAMIN B-12) 5000 MCG TBDP Take 5,000 Units by mouth every 3 (three) days.     lisinopril-hydrochlorothiazide (ZESTORETIC) 20-25 MG tablet TAKE 1 TABLET BY MOUTH EVERY DAY 90 tablet 1   MAGNESIUM OXIDE PO Take 0.5 tablets by mouth daily.     MYRBETRIQ 50 MG TB24 tablet Take 50 mg by mouth daily.     Polyethylene Glycol 3350 (MIRALAX PO) Take 17 g by mouth as needed (constipation).     simvastatin (ZOCOR) 10 MG tablet TAKE 1 TABLET BY MOUTH EVERYDAY AT BEDTIME (Patient taking differently: Take 10 mg by mouth at bedtime.) 90 tablet 3   No current facility-administered medications on file prior to visit.   Past Medical History:  Diagnosis Date   High cholesterol    Hypertension    Osteoarthritis    Raynaud disease    Allergies  Allergen Reactions   Prednisone Other (See Comments)    Dehydration, amnesia    Ciprofloxacin Other (See Comments)    GI issues   Ibuprofen Nausea Only and Other (See Comments)    Nausea & headaches    Social History   Socioeconomic History   Marital status: Widowed    Spouse name: Not on file   Number of children: Not on file   Years of education: Not on file   Highest education level: Master's degree (e.g., MA, MS, MEng, MEd, MSW, MBA)  Occupational History   Not on file  Tobacco Use   Smoking status: Never   Smokeless tobacco: Never  Vaping Use   Vaping Use: Never used  Substance and Sexual Activity   Alcohol use: Yes    Comment: social   Drug use: No   Sexual activity: Not Currently  Other Topics Concern   Not on file  Social  History Narrative   Not on file   Social Determinants of Health   Financial Resource Strain: Low Risk  (06/16/2021)   Overall Financial Resource Strain (CARDIA)    Difficulty of Paying Living Expenses: Not hard at all  Food Insecurity: No Food Insecurity (06/16/2021)   Hunger Vital Sign    Worried About Running Out of Food in the Last Year: Never true    [place] in the Last Year: Never true  Transportation Needs: No Transportation Needs (06/16/2021)   PRAPARE - Hydrologist (Medical): No    Lack of Transportation (Non-Medical): No  Physical Activity: Sufficiently Active (06/16/2021)   Exercise Vital Sign    Days of Exercise  per Week: 7 days    Minutes of Exercise per Session: 100 min  Stress: No Stress Concern Present (06/16/2021)   [place]    Feeling of Stress : Not at all  Social Connections: Unknown (06/16/2021)   Social Connection and Isolation Panel [NHANES]    Frequency of Communication with Friends and Family: More than three times a week    Frequency of Social Gatherings with Friends and Family: Not on file    Attends Religious Services: Patient refused    Active Member of Clubs or Organizations: No    Attends Archivist Meetings: Not on file    Marital Status: Widowed   Vitals:   02/14/22 1116  BP: 102/64  Pulse: 83  Resp: 16  Temp: 98.7 F (37.1 C)  SpO2: 97%   Body mass index is 22.15 kg/m.  Physical Exam Vitals and nursing note reviewed.  Constitutional:      General: She is not in acute distress.    Appearance: She is well-developed.  HENT:     Head: Normocephalic and atraumatic.     Right Ear: Tympanic membrane, ear canal and external ear normal.     Left Ear: Tympanic membrane, ear canal and external ear normal.     Mouth/Throat:     Mouth: Mucous membranes are moist.     Pharynx: Oropharynx is clear.  Eyes:     Conjunctiva/sclera:  Conjunctivae normal.  Cardiovascular:     Rate and Rhythm: Normal rate and regular rhythm.     Heart sounds: No murmur heard.    Comments: DP pulses palpable bilateral Pulmonary:     Effort: Pulmonary effort is normal. No respiratory distress.     Breath sounds: Normal breath sounds.  Abdominal:     Palpations: Abdomen is soft. There is no mass.     Tenderness: There is no abdominal tenderness.  Lymphadenopathy:     Cervical: No cervical adenopathy.  Skin:    General: Skin is warm.     Findings: No erythema or rash.  Neurological:     General: No focal deficit present.     Mental Status: She is alert and oriented to person, place, and time.     Cranial Nerves: No cranial nerve deficit.     Gait: Gait normal.     Comments: Does not remember day/date.  Remembers month and year. Oriented in place and person. Gait assisted by a walker.  Psychiatric:        Mood and Affect: Mood and affect normal.   ASSESSMENT AND PLAN:  Ms.Adine was seen today for hospitalization follow-up.  Diagnoses and all orders for this visit:  Essential hypertension Assessment & Plan: BP adequately controlled. Continue amlodipine 10 mg and lisinopril-HCTZ 20-25 mg daily. Continue monitoring BP regularly and following low-salt diet. Eye exam is current.  Orders: -     Basic metabolic panel; Future  Localized osteoporosis without current pathological fracture Assessment & Plan: Refills for Actonel 35 mg sent to her pharmacy to continue once weekly. Continue fall precautions and adequate calcium and vitamin D supplementation.  Orders: -     Risedronate Sodium; Take 1 tablet (35 mg total) by mouth every 7 (seven) days. with water on empty stomach, nothing by mouth or lie down for next 30 minutes.  Dispense: 13 tablet; Refill: 3  Thrombocytopenia (HCC) Assessment & Plan: Status post platelet transfusion, 2 units. Thought to be caused by viral illness. Clearly instructed about  warning signs. CBC  ordered today. Hematology referral was placed.  Orders: -     CBC; Future -     Basic metabolic panel; Future -     Ambulatory referral to Hematology / Oncology  Sensorineural hearing loss (SNHL) of both ears Assessment & Plan: This is a chronic problem gradually getting worse, patient has not noted significant hearing changes. We decided not to perform hearing screening today, we will reevaluate once she recovers from recent illness.    Word finding difficulty Assessment & Plan: Noted by family members for a while and with no other cognitive dysfunction. We discussed possible etiologies. Daughter and patient agrees with holding on cognitive testing and still she recovers from recent illness. We will plan on neuropsychology evaluation.   Elevated transaminase level -     Hepatic function panel; Future  Bilateral hydronephrosis Assessment & Plan: Mild with associated bladder distention, this was seen on abdominal/pelvis CT on 02/08/2022. Her daughter is going to call her urologist to arrange follow-up appointment. She is currently on Myrbetriq 50 mg daily.   Return in about 6 weeks (around 03/28/2022) for chronic problems.  Santana Gosdin G. Martinique, MD  [facility]. [place] office.

## 2022-02-14 ENCOUNTER — Ambulatory Visit (INDEPENDENT_AMBULATORY_CARE_PROVIDER_SITE_OTHER): Payer: PPO | Admitting: Family Medicine

## 2022-02-14 ENCOUNTER — Encounter: Payer: Self-pay | Admitting: Family Medicine

## 2022-02-14 VITALS — BP 102/64 | HR 83 | Temp 98.7°F | Resp 16 | Ht 62.0 in | Wt 121.1 lb

## 2022-02-14 DIAGNOSIS — R7401 Elevation of levels of liver transaminase levels: Secondary | ICD-10-CM

## 2022-02-14 DIAGNOSIS — M816 Localized osteoporosis [Lequesne]: Secondary | ICD-10-CM | POA: Diagnosis not present

## 2022-02-14 DIAGNOSIS — R4789 Other speech disturbances: Secondary | ICD-10-CM

## 2022-02-14 DIAGNOSIS — H903 Sensorineural hearing loss, bilateral: Secondary | ICD-10-CM

## 2022-02-14 DIAGNOSIS — N1330 Unspecified hydronephrosis: Secondary | ICD-10-CM | POA: Diagnosis not present

## 2022-02-14 DIAGNOSIS — D696 Thrombocytopenia, unspecified: Secondary | ICD-10-CM | POA: Diagnosis not present

## 2022-02-14 DIAGNOSIS — I10 Essential (primary) hypertension: Secondary | ICD-10-CM

## 2022-02-14 LAB — CBC
HCT: 33.7 % — ABNORMAL LOW (ref 36.0–46.0)
Hemoglobin: 11.6 g/dL — ABNORMAL LOW (ref 12.0–15.0)
MCHC: 34.3 g/dL (ref 30.0–36.0)
MCV: 91 fl (ref 78.0–100.0)
Platelets: 347 10*3/uL (ref 150.0–400.0)
RBC: 3.7 Mil/uL — ABNORMAL LOW (ref 3.87–5.11)
RDW: 13.1 % (ref 11.5–15.5)
WBC: 13.3 10*3/uL — ABNORMAL HIGH (ref 4.0–10.5)

## 2022-02-14 LAB — BASIC METABOLIC PANEL
BUN: 13 mg/dL (ref 6–23)
CO2: 27 mEq/L (ref 19–32)
Calcium: 7.9 mg/dL — ABNORMAL LOW (ref 8.4–10.5)
Chloride: 87 mEq/L — ABNORMAL LOW (ref 96–112)
Creatinine, Ser: 0.76 mg/dL (ref 0.40–1.20)
GFR: 73.12 mL/min (ref >60.00)
Glucose, Bld: 196 mg/dL — ABNORMAL HIGH (ref 70–99)
Potassium: 3.4 mEq/L — ABNORMAL LOW (ref 3.5–5.1)
Sodium: 123 mEq/L — ABNORMAL LOW (ref 135–145)

## 2022-02-14 LAB — HEPATIC FUNCTION PANEL
ALT: 104 U/L — ABNORMAL HIGH (ref 0–35)
AST: 66 U/L — ABNORMAL HIGH (ref 0–37)
Albumin: 3 g/dL — ABNORMAL LOW (ref 3.5–5.2)
Alkaline Phosphatase: 119 U/L — ABNORMAL HIGH (ref 39–117)
Bilirubin, Direct: 0.3 mg/dL (ref 0.0–0.3)
Total Bilirubin: 0.8 mg/dL (ref 0.2–1.2)
Total Protein: 6.2 g/dL (ref 6.0–8.3)

## 2022-02-14 MED ORDER — RISEDRONATE SODIUM 35 MG PO TABS: 35.0000 mg | ORAL_TABLET | ORAL | 3 refills | Status: DC

## 2022-02-14 NOTE — Assessment & Plan Note (Signed)
BP adequately controlled. Continue amlodipine 10 mg and lisinopril-HCTZ 20-25 mg daily. Continue monitoring BP regularly and following low-salt diet. Eye exam is current.

## 2022-02-14 NOTE — Assessment & Plan Note (Signed)
Noted by family members for a while and with no other cognitive dysfunction. We discussed possible etiologies. Daughter and patient agrees with holding on cognitive testing and still she recovers from recent illness. We will plan on neuropsychology evaluation.

## 2022-02-14 NOTE — Patient Instructions (Addendum)
A few things to remember from today's visit:  Essential hypertension - Plan: Basic metabolic panel  Localized osteoporosis without current pathological fracture  Thrombocytopenia (HCC) - Plan: CBC, Basic metabolic panel, Ambulatory referral to Hematology / Oncology  No changes today. Referral to hematologist placed. Continue monitoring blood pressure regularly. Fall precautions.  If you need refills for medications you take chronically, please call your pharmacy. Do not use My Chart to request refills or for acute issues that need immediate attention. If you send a my chart message, it may take a few days to be addressed, specially if I am not in the office.  Please be sure medication list is accurate. If a new problem present, please set up appointment sooner than planned today.

## 2022-02-14 NOTE — Assessment & Plan Note (Signed)
Refills for Actonel 35 mg sent to her pharmacy to continue once weekly. Continue fall precautions and adequate calcium and vitamin D supplementation.

## 2022-02-14 NOTE — Assessment & Plan Note (Signed)
Status post platelet transfusion, 2 units. Thought to be caused by viral illness. Clearly instructed about warning signs. CBC ordered today. Hematology referral was placed.

## 2022-02-14 NOTE — Assessment & Plan Note (Signed)
Mild with associated bladder distention, this was seen on abdominal/pelvis CT on 02/08/2022. Her daughter is going to call her urologist to arrange follow-up appointment. She is currently on Myrbetriq 50 mg daily.

## 2022-02-14 NOTE — Assessment & Plan Note (Signed)
This is a chronic problem gradually getting worse, patient has not noted significant hearing changes. We decided not to perform hearing screening today, we will reevaluate once she recovers from recent illness.

## 2022-02-19 ENCOUNTER — Other Ambulatory Visit: Payer: Self-pay | Admitting: Family Medicine

## 2022-02-19 DIAGNOSIS — I10 Essential (primary) hypertension: Secondary | ICD-10-CM

## 2022-02-19 DIAGNOSIS — R739 Hyperglycemia, unspecified: Secondary | ICD-10-CM

## 2022-02-19 DIAGNOSIS — E871 Hypo-osmolality and hyponatremia: Secondary | ICD-10-CM

## 2022-02-19 MED ORDER — LISINOPRIL 20 MG PO TABS: 20.0000 mg | ORAL_TABLET | Freq: Every day | ORAL | 1 refills | Status: DC

## 2022-02-19 NOTE — Progress Notes (Signed)
Lab Results  Component Value Date   HGBA1C 5.9 05/04/2021

## 2022-02-19 NOTE — Addendum Note (Signed)
Addended by: Martinique, Shaia Porath G on: 02/19/2022 08:58 PM   Modules accepted: Orders

## 2022-02-20 ENCOUNTER — Telehealth: Payer: Self-pay | Admitting: Hematology and Oncology

## 2022-02-20 NOTE — Telephone Encounter (Signed)
Scheduled appointment per 11/14 referral . Patient is aware of appointment date and time. Patient is aware to arrive 15 mins prior to appointment time and to bring updated insurance cards. Patient is aware of location.

## 2022-02-22 ENCOUNTER — Other Ambulatory Visit (INDEPENDENT_AMBULATORY_CARE_PROVIDER_SITE_OTHER): Payer: PPO

## 2022-02-22 DIAGNOSIS — R739 Hyperglycemia, unspecified: Secondary | ICD-10-CM | POA: Diagnosis not present

## 2022-02-22 DIAGNOSIS — E871 Hypo-osmolality and hyponatremia: Secondary | ICD-10-CM | POA: Diagnosis not present

## 2022-02-22 LAB — BASIC METABOLIC PANEL
BUN: 16 mg/dL (ref 6–23)
CO2: 27 mEq/L (ref 19–32)
Calcium: 8.8 mg/dL (ref 8.4–10.5)
Chloride: 96 mEq/L (ref 96–112)
Creatinine, Ser: 0.66 mg/dL (ref 0.40–1.20)
GFR: 81.85 mL/min (ref >60.00)
Glucose, Bld: 97 mg/dL (ref 70–99)
Potassium: 4.7 mEq/L (ref 3.5–5.1)
Sodium: 129 mEq/L — ABNORMAL LOW (ref 135–145)

## 2022-02-22 LAB — HEMOGLOBIN A1C: Hgb A1c MFr Bld: 6.5 % (ref 4.6–6.5)

## 2022-03-03 ENCOUNTER — Inpatient Hospital Stay: Payer: PPO | Attending: Hematology and Oncology | Admitting: Hematology and Oncology

## 2022-03-03 ENCOUNTER — Inpatient Hospital Stay: Payer: PPO

## 2022-03-03 ENCOUNTER — Encounter: Payer: Self-pay | Admitting: Hematology and Oncology

## 2022-03-03 ENCOUNTER — Other Ambulatory Visit: Payer: Self-pay

## 2022-03-03 VITALS — BP 137/67 | HR 115 | Temp 97.9°F | Resp 16 | Ht 62.0 in | Wt 114.8 lb

## 2022-03-03 DIAGNOSIS — M47817 Spondylosis without myelopathy or radiculopathy, lumbosacral region: Secondary | ICD-10-CM | POA: Insufficient documentation

## 2022-03-03 DIAGNOSIS — I700 Atherosclerosis of aorta: Secondary | ICD-10-CM | POA: Insufficient documentation

## 2022-03-03 DIAGNOSIS — Z87440 Personal history of urinary (tract) infections: Secondary | ICD-10-CM | POA: Insufficient documentation

## 2022-03-03 DIAGNOSIS — K429 Umbilical hernia without obstruction or gangrene: Secondary | ICD-10-CM | POA: Diagnosis not present

## 2022-03-03 DIAGNOSIS — Z881 Allergy status to other antibiotic agents status: Secondary | ICD-10-CM | POA: Diagnosis not present

## 2022-03-03 DIAGNOSIS — Z79899 Other long term (current) drug therapy: Secondary | ICD-10-CM | POA: Diagnosis not present

## 2022-03-03 DIAGNOSIS — E119 Type 2 diabetes mellitus without complications: Secondary | ICD-10-CM | POA: Diagnosis not present

## 2022-03-03 DIAGNOSIS — Z8249 Family history of ischemic heart disease and other diseases of the circulatory system: Secondary | ICD-10-CM | POA: Insufficient documentation

## 2022-03-03 DIAGNOSIS — M47816 Spondylosis without myelopathy or radiculopathy, lumbar region: Secondary | ICD-10-CM | POA: Insufficient documentation

## 2022-03-03 DIAGNOSIS — R410 Disorientation, unspecified: Secondary | ICD-10-CM | POA: Insufficient documentation

## 2022-03-03 DIAGNOSIS — N136 Pyonephrosis: Secondary | ICD-10-CM | POA: Insufficient documentation

## 2022-03-03 DIAGNOSIS — D696 Thrombocytopenia, unspecified: Secondary | ICD-10-CM

## 2022-03-03 DIAGNOSIS — K449 Diaphragmatic hernia without obstruction or gangrene: Secondary | ICD-10-CM | POA: Insufficient documentation

## 2022-03-03 DIAGNOSIS — I10 Essential (primary) hypertension: Secondary | ICD-10-CM | POA: Insufficient documentation

## 2022-03-03 DIAGNOSIS — Z888 Allergy status to other drugs, medicaments and biological substances status: Secondary | ICD-10-CM | POA: Insufficient documentation

## 2022-03-03 DIAGNOSIS — Z886 Allergy status to analgesic agent status: Secondary | ICD-10-CM | POA: Diagnosis not present

## 2022-03-03 DIAGNOSIS — I3139 Other pericardial effusion (noninflammatory): Secondary | ICD-10-CM | POA: Insufficient documentation

## 2022-03-03 DIAGNOSIS — I6782 Cerebral ischemia: Secondary | ICD-10-CM | POA: Diagnosis not present

## 2022-03-03 DIAGNOSIS — J984 Other disorders of lung: Secondary | ICD-10-CM | POA: Diagnosis not present

## 2022-03-03 DIAGNOSIS — R7402 Elevation of levels of lactic acid dehydrogenase (LDH): Secondary | ICD-10-CM | POA: Insufficient documentation

## 2022-03-03 LAB — CBC WITH DIFFERENTIAL/PLATELET
Abs Immature Granulocytes: 0.02 10*3/uL (ref 0.00–0.07)
Basophils Absolute: 0.1 10*3/uL (ref 0.0–0.1)
Basophils Relative: 1 %
Eosinophils Absolute: 0.1 10*3/uL (ref 0.0–0.5)
Eosinophils Relative: 1 %
HCT: 37.2 % (ref 36.0–46.0)
Hemoglobin: 12.2 g/dL (ref 12.0–15.0)
Immature Granulocytes: 0 %
Lymphocytes Relative: 18 %
Lymphs Abs: 1.4 10*3/uL (ref 0.7–4.0)
MCH: 31.2 pg (ref 26.0–34.0)
MCHC: 32.8 g/dL (ref 30.0–36.0)
MCV: 95.1 fL (ref 80.0–100.0)
Monocytes Absolute: 0.7 10*3/uL (ref 0.1–1.0)
Monocytes Relative: 9 %
Neutro Abs: 5.3 10*3/uL (ref 1.7–7.7)
Neutrophils Relative %: 71 %
Platelets: 300 10*3/uL (ref 150–400)
RBC: 3.91 MIL/uL (ref 3.87–5.11)
RDW: 14.3 % (ref 11.5–15.5)
WBC: 7.5 10*3/uL (ref 4.0–10.5)
nRBC: 0 % (ref 0.0–0.2)

## 2022-03-03 LAB — COMPREHENSIVE METABOLIC PANEL
ALT: 23 U/L (ref 0–44)
AST: 20 U/L (ref 15–41)
Albumin: 4.2 g/dL (ref 3.5–5.0)
Alkaline Phosphatase: 57 U/L (ref 38–126)
Anion gap: 7 (ref 5–15)
BUN: 32 mg/dL — ABNORMAL HIGH (ref 8–23)
CO2: 26 mmol/L (ref 22–32)
Calcium: 9.7 mg/dL (ref 8.9–10.3)
Chloride: 105 mmol/L (ref 98–111)
Creatinine, Ser: 0.89 mg/dL (ref 0.44–1.00)
GFR, Estimated: >60 mL/min (ref >60)
Glucose, Bld: 94 mg/dL (ref 70–99)
Potassium: 4.3 mmol/L (ref 3.5–5.1)
Sodium: 138 mmol/L (ref 135–145)
Total Bilirubin: 0.7 mg/dL (ref 0.3–1.2)
Total Protein: 7.9 g/dL (ref 6.5–8.1)

## 2022-03-03 LAB — LACTATE DEHYDROGENASE: LDH: 194 U/L — ABNORMAL HIGH (ref 98–192)

## 2022-03-03 LAB — FERRITIN: Ferritin: 411 ng/mL — ABNORMAL HIGH (ref 11–307)

## 2022-03-03 LAB — VITAMIN B12: Vitamin B-12: 1638 pg/mL — ABNORMAL HIGH (ref 180–914)

## 2022-03-03 NOTE — Progress Notes (Unsigned)
[place] NOTE  Patient Care Team: Martinique, Betty G, MD as PCP - General (Family Medicine)  CHIEF COMPLAINTS/PURPOSE OF CONSULTATION:  Thrombocytopenia  ASSESSMENT & PLAN:  No problem-specific Assessment & Plan notes found for this encounter.  No orders of the defined types were placed in this encounter.    HISTORY OF PRESENTING ILLNESS:  Molly Wilcox 81 y.o. female is here because of thrombocytopenia.   This is a very pleasant 81 yr old female with PMH of HTN, dyslipidemia and Raynaud's who initially presented on 02/06/2022 with lethargy, poor p.o. intake and was found to have a urinary tract infection.  She was prescribed Keflex therapy but unfortunately did not take this.  She again presented on 02/08/2022 with confusion.  She was given IV hydration and started on ceftriaxone and admitted to the hospital for further evaluation and management.  She was seen by Dr Lorenso Courier and she was thought to have cytopenias related to acute infection or hospitalization. She is here with her friend today. She says she is she feeling more energetic. She seems really worried that her Hb A1c is going up and she was worried about what to eat and how to gain weight but at the same time control her HbA1c. She denies any bleeding complaints. She feels very well, she says.  REVIEW OF SYSTEMS:   Constitutional: Denies fevers, chills or abnormal night sweats Eyes: Denies blurriness of vision, double vision or watery eyes Ears, nose, mouth, throat, and face: Denies mucositis or sore throat Respiratory: Denies cough, dyspnea or wheezes Cardiovascular: Denies palpitation, chest discomfort or lower extremity swelling Gastrointestinal:  Denies nausea, heartburn or change in bowel habits Skin: Denies abnormal skin rashes Lymphatics: Denies new lymphadenopathy or easy bruising Neurological:Denies numbness, tingling or new weaknesses Behavioral/Psych: Mood is stable, no new changes  All  other systems were reviewed with the patient and are negative.  MEDICAL HISTORY:  Past Medical History:  Diagnosis Date   High cholesterol    Hypertension    Osteoarthritis    Raynaud disease     SURGICAL HISTORY: Past Surgical History:  Procedure Laterality Date   TUBAL LIGATION      SOCIAL HISTORY: Social History   Socioeconomic History   Marital status: Widowed    Spouse name: Not on file   Number of children: Not on file   Years of education: Not on file   Highest education level: Master's degree (e.g., MA, MS, MEng, MEd, MSW, MBA)  Occupational History   Not on file  Tobacco Use   Smoking status: Never   Smokeless tobacco: Never  Vaping Use   Vaping Use: Never used  Substance and Sexual Activity   Alcohol use: Yes    Comment: social   Drug use: No   Sexual activity: Not Currently  Other Topics Concern   Not on file  Social History Narrative   Not on file   Social Determinants of Health   Financial Resource Strain: Low Risk  (06/16/2021)   Overall Financial Resource Strain (CARDIA)    Difficulty of Paying Living Expenses: Not hard at all  Food Insecurity: No Food Insecurity (06/16/2021)   Hunger Vital Sign    Worried About Running Out of Food in the Last Year: Never true    [place] in the Last Year: Never true  Transportation Needs: No Transportation Needs (06/16/2021)   PRAPARE - Hydrologist (Medical): No    Lack of Transportation (  Non-Medical): No  Physical Activity: Sufficiently Active (06/16/2021)   Exercise Vital Sign    Days of Exercise per Week: 7 days    Minutes of Exercise per Session: 100 min  Stress: No Stress Concern Present (06/16/2021)   [place]    Feeling of Stress : Not at all  Social Connections: Unknown (06/16/2021)   Social Connection and Isolation Panel [NHANES]    Frequency of Communication with Friends and Family: More than  three times a week    Frequency of Social Gatherings with Friends and Family: Not on file    Attends Religious Services: Patient refused    Active Member of Clubs or Organizations: No    Attends Archivist Meetings: Not on file    Marital Status: Widowed  Human resources officer Violence: Not on file    FAMILY HISTORY: Family History  Problem Relation Age of Onset   Heart disease Father    Thyroid disease Neg Hx     ALLERGIES:  is allergic to prednisone, ciprofloxacin, and ibuprofen.  MEDICATIONS:  Current Outpatient Medications  Medication Sig Dispense Refill   amLODipine (NORVASC) 10 MG tablet TAKE 1 TABLET BY MOUTH EVERY DAY (Patient taking differently: Take 10 mg by mouth daily.) 90 tablet 3   Ascorbic Acid (VITAMIN C) 250 MG CHEW Chew 250 mg by mouth daily.     aspirin EC 81 MG tablet Take 81 mg by mouth daily.     Calcium Carbonate Antacid (TUMS PO) Take 2 tablets by mouth daily as needed (heartburn).     Calcium-Phosphorus-Vitamin D (CALCIUM GUMMIES) [phone] MG-MG-UNIT CHEW Chew 2 tablets by mouth daily.     Cyanocobalamin (VITAMIN B-12) 5000 MCG TBDP Take 5,000 Units by mouth every 3 (three) days.     lisinopril (ZESTRIL) 20 MG tablet Take 1 tablet (20 mg total) by mouth daily. 30 tablet 1   MAGNESIUM OXIDE PO Take 0.5 tablets by mouth daily.     MYRBETRIQ 50 MG TB24 tablet Take 50 mg by mouth daily.     Polyethylene Glycol 3350 (MIRALAX PO) Take 17 g by mouth as needed (constipation).     risedronate (ACTONEL) 35 MG tablet Take 1 tablet (35 mg total) by mouth every 7 (seven) days. with water on empty stomach, nothing by mouth or lie down for next 30 minutes. 13 tablet 3   simvastatin (ZOCOR) 10 MG tablet TAKE 1 TABLET BY MOUTH EVERYDAY AT BEDTIME (Patient taking differently: Take 10 mg by mouth at bedtime.) 90 tablet 3   No current facility-administered medications for this visit.     PHYSICAL EXAMINATION: ECOG PERFORMANCE STATUS: {CHL ONC ECOG  VP:[id number]}  Vitals:   03/03/22 1044  BP: 137/67  Pulse: (!) 115  Resp: 16  Temp: 97.9 F (36.6 C)  SpO2: 97%   Filed Weights   03/03/22 1044  Weight: 114 lb 12.8 oz (52.1 kg)    GENERAL:alert, no distress and comfortable SKIN: skin color, texture, turgor are normal, no rashes or significant lesions EYES: normal, conjunctiva are pink and non-injected, sclera clear OROPHARYNX:no exudate, no erythema and lips, buccal mucosa, and tongue normal  NECK: supple, thyroid normal size, non-tender, without nodularity LYMPH:  no palpable lymphadenopathy in the cervical, axillary or inguinal LUNGS: clear to auscultation and percussion with normal breathing effort HEART: regular rate & rhythm and no murmurs and no lower extremity edema ABDOMEN:abdomen soft, non-tender and normal bowel sounds Musculoskeletal:no cyanosis of digits and no clubbing  PSYCH:  alert & oriented x 3 with fluent speech NEURO: no focal motor/sensory deficits  LABORATORY DATA:  I have reviewed the data as listed Lab Results  Component Value Date   WBC 13.3 (H) 02/14/2022   HGB 11.6 (L) 02/14/2022   HCT 33.7 (L) 02/14/2022   MCV 91.0 02/14/2022   PLT 347.0 02/14/2022     Chemistry      Component Value Date/Time   NA 129 (L) 02/22/2022 1057   NA 139 04/03/2015 0000   K 4.7 02/22/2022 1057   CL 96 02/22/2022 1057   CO2 27 02/22/2022 1057   BUN 16 02/22/2022 1057   BUN 23 (A) 04/03/2015 0000   CREATININE 0.66 02/22/2022 1057   CREATININE 0.79 03/10/2020 1046   GLU 102 04/03/2015 0000      Component Value Date/Time   CALCIUM 8.8 02/22/2022 1057   ALKPHOS 119 (H) 02/14/2022 1357   AST 66 (H) 02/14/2022 1357   ALT 104 (H) 02/14/2022 1357   BILITOT 0.8 02/14/2022 1357       RADIOGRAPHIC STUDIES: I have personally reviewed the radiological images as listed and agreed with the findings in the report. CT HEAD WO CONTRAST (5MM)  Result Date: 02/10/2022 CLINICAL DATA:  Altered mental status. EXAM: CT  HEAD WITHOUT CONTRAST TECHNIQUE: Contiguous axial images were obtained from the base of the skull through the vertex without intravenous contrast. RADIATION DOSE REDUCTION: This exam was performed according to the departmental dose-optimization program which includes automated exposure control, adjustment of the mA and/or kV according to patient size and/or use of iterative reconstruction technique. COMPARISON:  MRI Brain 02/09/22, CT head 02/08/22 FINDINGS: Brain: No evidence of acute infarction, hemorrhage, hydrocephalus, extra-axial collection or mass lesion/mass effect. There is sequela of severe chronic microvascular ischemic change. Vascular: No hyperdense vessel or unexpected calcification. Skull: Normal. Negative for fracture or focal lesion. Sinuses/Orbits: No acute finding. Other: None. IMPRESSION: No acute intracranial abnormality. Electronically Signed   By: Marin Roberts M.D.   On: 02/10/2022 10:15   MR BRAIN WO CONTRAST  Result Date: 02/09/2022 CLINICAL DATA:  81 year old female altered mental status, confusion. EXAM: MRI HEAD WITHOUT CONTRAST TECHNIQUE: Multiplanar, multiecho pulse sequences of the brain and surrounding structures were obtained without intravenous contrast. COMPARISON:  Head CT yesterday. FINDINGS: Brain: No restricted diffusion to suggest acute infarction. No midline shift, mass effect, evidence of mass lesion, ventriculomegaly, extra-axial collection or acute intracranial hemorrhage. Cervicomedullary junction and pituitary are within normal limits. Cerebral volume is within normal limits for age. Largely normal for age gray and white matter signal throughout the brain also; mild for age scattered nonspecific white matter T2 and FLAIR hyperintensity. No cortical encephalomalacia or chronic cerebral blood products identified. Questionable left cerebellar malacia on series 9, image 11, but favor artifact instead as not correlated on coronal. Deep gray nuclei and brainstem are within  normal limits. Vascular: Major intracranial vascular flow voids are preserved. Skull and upper cervical spine: Normal for age visible cervical spine. Visualized bone marrow signal is within normal limits. Sinuses/Orbits: Orbits are within normal limits. Paranasal sinuses and mastoids are stable and well aerated. Other: Visible internal auditory structures appear normal. Negative visible scalp and face. IMPRESSION: No acute intracranial abnormality and essentially normal for age noncontrast MRI appearance of the brain. Electronically Signed   By: Genevie Ann M.D.   On: 02/09/2022 06:32   DG Chest Portable 1 View  Result Date: 02/09/2022 CLINICAL DATA:  Fever, altered mental status. EXAM: PORTABLE CHEST 1 VIEW COMPARISON:  None Available. FINDINGS: The heart size and mediastinal contours are within normal limits. There is atherosclerotic calcification of the aorta. Hyperinflation of the lungs is noted. Minimal atelectasis is seen at the right lung base. No effusion or pneumothorax. No acute osseous abnormality. IMPRESSION: Hyperinflation of the lungs with mild atelectasis at the right lung base. Electronically Signed   By: Brett Fairy M.D.   On: 02/09/2022 00:27   CT ABDOMEN PELVIS W CONTRAST  Result Date: 02/08/2022 CLINICAL DATA:  Abdominal pain EXAM: CT ABDOMEN AND PELVIS WITH CONTRAST TECHNIQUE: Multidetector CT imaging of the abdomen and pelvis was performed using the standard protocol following bolus administration of intravenous contrast. RADIATION DOSE REDUCTION: This exam was performed according to the departmental dose-optimization program which includes automated exposure control, adjustment of the mA and/or kV according to patient size and/or use of iterative reconstruction technique. CONTRAST:  41m OMNIPAQUE IOHEXOL 300 MG/ML  SOLN COMPARISON:  None Available. FINDINGS: Lower chest: There are scattered coronary artery calcifications. Minimal pericardial effusion is seen. Visualized lower lung  fields show small linear densities in right middle lobe, lingula and both lower lobes. There is mild elevation of posterior aspects of both hemidiaphragms. Hepatobiliary: No focal abnormalities are seen. There is no dilation of intrahepatic bile ducts. Distal common bile duct in the head of the pancreas measures 7 mm. Gallbladder is unremarkable. Pancreas: There is coarse calcification in tail of pancreas. Spleen: Unremarkable. Adrenals/Urinary Tract: There is mild hyperplasia of left adrenal. There is mild bilateral hydronephrosis. This may be due to ureteropelvic junction obstruction or caused by distended urinary bladder. Urinary bladder measures 14.9 cm in maximum diameter. There is no wall thickening in the bladder. Stomach/Bowel: Small hiatal hernia is seen. Stomach is not distended. Small bowel loops are not dilated. Appendix is not dilated. There is no significant wall thickening in colon. There is no pericolic stranding. Vascular/Lymphatic: Scattered calcifications are seen in abdominal aorta and its major branches. Reproductive: Lobulations are seen in the margin of uterus, possibly small fibroids with calcification. Other: There is no ascites or pneumoperitoneum. Small umbilical hernia containing fat is seen. Musculoskeletal: Degenerative changes are noted in lumbar spine at the L5-S1 level. Degenerative changes are noted in both hips. IMPRESSION: There is no evidence of intestinal obstruction or pneumoperitoneum. Appendix is not dilated. There is mild bilateral hydronephrosis, more so on the right side. This may suggest ureteropelvic junction obstruction or related to distention of the urinary bladder. There is marked distention of the bladder measuring up to 14.9 cm in maximum diameter. Minimal pericardial effusion. Aortic arteriosclerosis. There are scattered coronary artery calcifications. Small hiatal hernia. Other findings as described in the body of the report. Electronically Signed   By: PElmer PickerM.D.   On: 02/08/2022 20:40   CT Head Wo Contrast  Result Date: 02/08/2022 CLINICAL DATA:  Altered mental status EXAM: CT HEAD WITHOUT CONTRAST TECHNIQUE: Contiguous axial images were obtained from the base of the skull through the vertex without intravenous contrast. RADIATION DOSE REDUCTION: This exam was performed according to the departmental dose-optimization program which includes automated exposure control, adjustment of the mA and/or kV according to patient size and/or use of iterative reconstruction technique. COMPARISON:  10/17/2006 FINDINGS: Brain: No acute intracranial findings are seen. There are no signs of bleeding within the cranium. Cortical sulci are prominent. There is decreased density in periventricular white matter. Vascular: Unremarkable. Skull: Unremarkable. Sinuses/Orbits: There is mucosal thickening in the ethmoid and right maxillary sinuses. Other: None. IMPRESSION: No acute intracranial  findings are seen in noncontrast CT brain. Atrophy. Small-vessel disease. Electronically Signed   By: Elmer Picker M.D.   On: 02/08/2022 18:36    All questions were answered. The patient knows to call the clinic with any problems, questions or concerns. I spent *** minutes in the care of this patient including H and P, review of records, counseling and coordination of care.     Benay Pike, MD 03/03/2022 10:49 AM

## 2022-03-07 ENCOUNTER — Telehealth: Payer: Self-pay | Admitting: *Deleted

## 2022-03-07 ENCOUNTER — Telehealth: Payer: Self-pay | Admitting: Family Medicine

## 2022-03-07 NOTE — Telephone Encounter (Signed)
Dr. Chryl Heck at the [facility] has been seeing Pt for blood disorders.  Dr will not continue seeing Pt for Thrombocytopenia, because it is resolved.  Lovey Newcomer will fax most recent OV notes to Dr. Martinique  Pt has mildly elevated serotonin and LDH.  MD is asking Dr. Martinique to FU with that.

## 2022-03-07 NOTE — Telephone Encounter (Signed)
FYI - patient has appt 12/11 with you.

## 2022-03-07 NOTE — Telephone Encounter (Signed)
Per Dr.Iruku's messages below - contacted Dr. Doug Sou office [phone] with message as requested. Message given to Easa:  Please also call her primary care physician's office and explained to them that we will not be following this patient since her thrombocytopenia has resolved however she has mildly elevated ferritin and LDH and will appreciate if they can follow-up on this.   Please send a copy of OV note from today to her primary care physician - Fax# [phone].  Copy faxed. Fax confirmation received.

## 2022-03-10 NOTE — Progress Notes (Unsigned)
HPI:  Molly Wilcox is a 81 y.o. female, who is here today to follow on recent visit. Sh was last seen on 02/14/22 for hospital follow up.  She reports feeling a lot better and has returned to her baseline activity level, including walking her dog daily.   She has been trying to eat more protein due to recent weight loss.   Prediabetes: Problem has been going on for about 2 years. HgA1C on 02/22/2022 was 6.5. States that her daughters were providing sugar drinks during her postoperative recovery. Since her last visit she has seen hematologist, who did not consider necessary to continue following given the fact platelets have returned to normal. Lab Results  Component Value Date   WBC 7.5 03/03/2022   HGB 12.2 03/03/2022   HCT 37.2 03/03/2022   MCV 95.1 03/03/2022   PLT 300 03/03/2022   Abdominal/pelvis CT done during hospitalization, 02/08/2022, showed mild bilateral hydronephrosis.  She did not see her urologist since her last visit. Negative for dysuria, gross hematuria, or decreased urine output. She follows annually with urologist, last visit in 04/2021.  Hypertension: Currently she is on amlodipine 10 mg daily and lisinopril 20 mg daily. Home BP readings in the 120s/70s. Sodium has normalized.  Negative for unusual/frequent headache, visual changes, CP, dyspnea, palpitations, focal neurologic deficit, or worsening edema. She is no longer on HCTZ. Lab Results  Component Value Date   CREATININE 0.89 03/03/2022   BUN 32 (H) 03/03/2022   NA 138 03/03/2022   K 4.3 03/03/2022   CL 105 03/03/2022   CO2 26 03/03/2022   Hyperlipidemia: During hospitalization simvastatin was discontinued due to abnormal LFTs. She is following low-fat diet. Lab Results  Component Value Date   CHOL 152 05/04/2021   HDL 61.50 05/04/2021   LDLCALC 75 05/04/2021   TRIG 81.0 05/04/2021   CHOLHDL 2 05/04/2021   Review of Systems  Constitutional:  Negative for activity change,  appetite change and fever.  HENT:  Negative for mouth sores, nosebleeds and trouble swallowing.   Respiratory:  Negative for cough and wheezing.   Gastrointestinal:  Negative for abdominal pain, nausea and vomiting.       Negative for changes in bowel habits.  Skin:  Negative for rash.  Neurological:  Negative for syncope and weakness.  See other pertinent positives and negatives in HPI.  Current Outpatient Medications on File Prior to Visit  Medication Sig Dispense Refill   amLODipine (NORVASC) 10 MG tablet TAKE 1 TABLET BY MOUTH EVERY DAY (Patient taking differently: Take 10 mg by mouth daily.) 90 tablet 3   Ascorbic Acid (VITAMIN C) 250 MG CHEW Chew 250 mg by mouth daily.     aspirin EC 81 MG tablet Take 81 mg by mouth daily.     Calcium Carbonate Antacid (TUMS PO) Take 2 tablets by mouth daily as needed (heartburn).     Calcium-Phosphorus-Vitamin D (CALCIUM GUMMIES) [phone] MG-MG-UNIT CHEW Chew 2 tablets by mouth daily.     Cyanocobalamin (VITAMIN B-12) 5000 MCG TBDP Take 5,000 Units by mouth every 3 (three) days.     lisinopril (ZESTRIL) 20 MG tablet Take 1 tablet (20 mg total) by mouth daily. 30 tablet 1   MAGNESIUM OXIDE PO Take 0.5 tablets by mouth daily.     MYRBETRIQ 50 MG TB24 tablet Take 50 mg by mouth daily.     Polyethylene Glycol 3350 (MIRALAX PO) Take 17 g by mouth as needed (constipation).     risedronate (ACTONEL) 35 MG  tablet Take 1 tablet (35 mg total) by mouth every 7 (seven) days. with water on empty stomach, nothing by mouth or lie down for next 30 minutes. 13 tablet 3   No current facility-administered medications on file prior to visit.   Past Medical History:  Diagnosis Date   High cholesterol    Hypertension    Osteoarthritis    Raynaud disease    Allergies  Allergen Reactions   Prednisone Other (See Comments)    Dehydration, amnesia    Ciprofloxacin Other (See Comments)    GI issues   Ibuprofen Nausea Only and Other (See Comments)    Nausea &  headaches   Social History   Socioeconomic History   Marital status: Widowed    Spouse name: Not on file   Number of children: Not on file   Years of education: Not on file   Highest education level: Master's degree (e.g., MA, MS, MEng, MEd, MSW, MBA)  Occupational History   Not on file  Tobacco Use   Smoking status: Never   Smokeless tobacco: Never  Vaping Use   Vaping Use: Never used  Substance and Sexual Activity   Alcohol use: Yes    Comment: social   Drug use: No   Sexual activity: Not Currently  Other Topics Concern   Not on file  Social History Narrative   Not on file   Social Determinants of Health   Financial Resource Strain: Low Risk  (06/16/2021)   Overall Financial Resource Strain (CARDIA)    Difficulty of Paying Living Expenses: Not hard at all  Food Insecurity: No Food Insecurity (06/16/2021)   Hunger Vital Sign    Worried About Running Out of Food in the Last Year: Never true    [place] in the Last Year: Never true  Transportation Needs: No Transportation Needs (06/16/2021)   PRAPARE - Hydrologist (Medical): No    Lack of Transportation (Non-Medical): No  Physical Activity: Sufficiently Active (06/16/2021)   Exercise Vital Sign    Days of Exercise per Week: 7 days    Minutes of Exercise per Session: 100 min  Stress: No Stress Concern Present (06/16/2021)   [place]    Feeling of Stress : Not at all  Social Connections: Unknown (06/16/2021)   Social Connection and Isolation Panel [NHANES]    Frequency of Communication with Friends and Family: More than three times a week    Frequency of Social Gatherings with Friends and Family: Not on file    Attends Religious Services: Patient refused    Active Member of Clubs or Organizations: No    Attends Archivist Meetings: Not on file    Marital Status: Widowed   Vitals:   03/13/22 0910  BP:  128/70  Pulse: 67  Resp: 16  Temp: 98.3 F (36.8 C)  SpO2: 98%   Wt Readings from Last 3 Encounters:  03/13/22 116 lb 4 oz (52.7 kg)  03/03/22 114 lb 12.8 oz (52.1 kg)  02/14/22 121 lb 2 oz (54.9 kg)  Body mass index is 21.26 kg/m.  Physical Exam Vitals and nursing note reviewed.  Constitutional:      General: She is not in acute distress.    Appearance: She is well-developed.  HENT:     Head: Normocephalic and atraumatic.     Mouth/Throat:     Mouth: Mucous membranes are moist.     Pharynx:  Oropharynx is clear.  Eyes:     Conjunctiva/sclera: Conjunctivae normal.  Cardiovascular:     Rate and Rhythm: Normal rate and regular rhythm.     Pulses:          Dorsalis pedis pulses are 2+ on the right side and 2+ on the left side.     Heart sounds: No murmur heard. Pulmonary:     Effort: Pulmonary effort is normal. No respiratory distress.     Breath sounds: Normal breath sounds.  Abdominal:     Palpations: Abdomen is soft. There is no hepatomegaly or mass.     Tenderness: There is no abdominal tenderness.  Musculoskeletal:     Right lower leg: 1+ Pitting Edema present.     Left lower leg: 1+ Pitting Edema present.  Lymphadenopathy:     Cervical: No cervical adenopathy.  Skin:    General: Skin is warm.     Findings: No erythema or rash.  Neurological:     General: No focal deficit present.     Mental Status: She is alert and oriented to person, place, and time.     Cranial Nerves: No cranial nerve deficit.     Gait: Gait normal.  Psychiatric:        Mood and Affect: Mood and affect normal.   ASSESSMENT AND PLAN: Molly Wilcox was seen today for follow-up. Diagnoses and all orders for this visit: Orders Placed This Encounter  Procedures   US Renal   POC HgB A1c   Lab Results  Component Value Date   HGBA1C 5.3 03/13/2022   Essential hypertension BP adequately controlled. Continue amlodipine 10 mg and lisinopril 20 mg daily as well as low salt diet. Continue  monitoring BP regularly. Eye exam is current.  Bilateral hydronephrosis She has not seen nephrologist since her last visit, sees provider annually, next appt in 4'2024. Renal US will be arranged.  Pure hypercholesterolemia She was on Simvastatin until recent hospitalization, liver function test has normalized. Instead resuming Simvastatin I recommend Pravastatin 20 mg daily. Continue low fat diet.  Prediabetes HgA1C improved, went from 6.5 to 5.3. Continue a healthy life style for diabetes prevention.   Return in about 4 months (around 07/13/2022).  Eulalie Speights G. Martinique, MD  [facility]. [place] office.

## 2022-03-13 ENCOUNTER — Ambulatory Visit (INDEPENDENT_AMBULATORY_CARE_PROVIDER_SITE_OTHER): Payer: PPO | Admitting: Family Medicine

## 2022-03-13 ENCOUNTER — Encounter: Payer: Self-pay | Admitting: Family Medicine

## 2022-03-13 VITALS — BP 128/70 | HR 67 | Temp 98.3°F | Resp 16 | Ht 62.0 in | Wt 116.2 lb

## 2022-03-13 DIAGNOSIS — R7303 Prediabetes: Secondary | ICD-10-CM | POA: Diagnosis not present

## 2022-03-13 DIAGNOSIS — N1330 Unspecified hydronephrosis: Secondary | ICD-10-CM

## 2022-03-13 DIAGNOSIS — I10 Essential (primary) hypertension: Secondary | ICD-10-CM | POA: Diagnosis not present

## 2022-03-13 DIAGNOSIS — E7800 Pure hypercholesterolemia, unspecified: Secondary | ICD-10-CM | POA: Diagnosis not present

## 2022-03-13 LAB — POCT GLYCOSYLATED HEMOGLOBIN (HGB A1C): Hemoglobin A1C: 5.3 % (ref 4.0–5.6)

## 2022-03-13 MED ORDER — PRAVASTATIN SODIUM 20 MG PO TABS: 20.0000 mg | ORAL_TABLET | Freq: Every day | ORAL | 1 refills | Status: DC

## 2022-03-13 NOTE — Assessment & Plan Note (Addendum)
BP adequately controlled. Continue amlodipine 10 mg and lisinopril 20 mg daily as well as low salt diet. Continue monitoring BP regularly. Eye exam is current.

## 2022-03-13 NOTE — Assessment & Plan Note (Addendum)
She was on Simvastatin until recent hospitalization, liver function test has normalized. Instead resuming Simvastatin I recommend Pravastatin 20 mg daily. Continue low fat diet.

## 2022-03-13 NOTE — Patient Instructions (Addendum)
A few things to remember from today's visit:  Essential hypertension  Pure hypercholesterolemia - Plan: pravastatin (PRAVACHOL) 20 MG tablet  Prediabetes - Plan: POC HgB A1c  Bilateral hydronephrosis - Plan: US Renal  We are not resuming Simvastatin. Pravastatin started today. No changes in rest.  If you need refills for medications you take chronically, please call your pharmacy. Do not use My Chart to request refills or for acute issues that need immediate attention. If you send a my chart message, it may take a few days to be addressed, specially if I am not in the office.  Please be sure medication list is accurate. If a new problem present, please set up appointment sooner than planned today.

## 2022-03-13 NOTE — Assessment & Plan Note (Signed)
She has not seen nephrologist since her last visit, sees provider annually, next appt in 4'2024. Renal US will be arranged.

## 2022-03-13 NOTE — Assessment & Plan Note (Signed)
HgA1C improved, went from 6.5 to 5.3. Continue a healthy life style for diabetes prevention.

## 2022-03-14 ENCOUNTER — Other Ambulatory Visit: Payer: Self-pay

## 2022-03-14 DIAGNOSIS — I10 Essential (primary) hypertension: Secondary | ICD-10-CM

## 2022-03-14 MED ORDER — LISINOPRIL 20 MG PO TABS: 20.0000 mg | ORAL_TABLET | Freq: Every day | ORAL | 1 refills | Status: DC

## 2022-03-21 ENCOUNTER — Ambulatory Visit: Payer: PPO | Admitting: Family Medicine

## 2022-03-22 ENCOUNTER — Ambulatory Visit
Admission: RE | Admit: 2022-03-22 | Discharge: 2022-03-22 | Disposition: A | Discharge status: Home / Self Care | Payer: PPO | Source: Ambulatory Visit | Attending: Family Medicine | Admitting: Family Medicine

## 2022-03-22 DIAGNOSIS — N1330 Unspecified hydronephrosis: Secondary | ICD-10-CM

## 2022-03-23 DIAGNOSIS — Z1231 Encounter for screening mammogram for malignant neoplasm of breast: Secondary | ICD-10-CM | POA: Diagnosis not present

## 2022-03-28 ENCOUNTER — Telehealth: Payer: Self-pay | Admitting: Family Medicine

## 2022-03-28 NOTE — Telephone Encounter (Signed)
Pt called, requesting a call back to go over her lab results.

## 2022-03-28 NOTE — Telephone Encounter (Signed)
Left voicemail for patient to call the office back.   

## 2022-03-30 ENCOUNTER — Encounter: Payer: Self-pay | Admitting: Family Medicine

## 2022-03-31 NOTE — Telephone Encounter (Signed)
See my chart encounter.

## 2022-04-07 ENCOUNTER — Other Ambulatory Visit: Payer: Self-pay | Admitting: Family Medicine

## 2022-04-07 DIAGNOSIS — I10 Essential (primary) hypertension: Secondary | ICD-10-CM

## 2022-04-07 MED ORDER — LOSARTAN POTASSIUM 50 MG PO TABS: 50.0000 mg | ORAL_TABLET | Freq: Every day | ORAL | 1 refills | Status: DC

## 2022-04-11 ENCOUNTER — Encounter: Payer: Self-pay | Admitting: Family Medicine

## 2022-04-12 DIAGNOSIS — R922 Inconclusive mammogram: Secondary | ICD-10-CM | POA: Diagnosis not present

## 2022-04-12 DIAGNOSIS — N6489 Other specified disorders of breast: Secondary | ICD-10-CM | POA: Diagnosis not present

## 2022-04-12 DIAGNOSIS — R928 Other abnormal and inconclusive findings on diagnostic imaging of breast: Secondary | ICD-10-CM | POA: Diagnosis not present

## 2022-04-12 DIAGNOSIS — R921 Mammographic calcification found on diagnostic imaging of breast: Secondary | ICD-10-CM | POA: Diagnosis not present

## 2022-04-26 DIAGNOSIS — N3941 Urge incontinence: Secondary | ICD-10-CM | POA: Diagnosis not present

## 2022-04-26 DIAGNOSIS — N8111 Cystocele, midline: Secondary | ICD-10-CM | POA: Diagnosis not present

## 2022-05-03 IMAGING — US US THYROID
1 series · 13 of 25 positions shown · non-contrast
Comparison: Multiple prior most recent 09/25/2018, with biopsy left
inferior thyroid nodule 10/29/2018

CLINICAL DATA: 78-year-old female with a history thyroid nodules

EXAM:
THYROID ULTRASOUND
TECHNIQUE: Ultrasound examination of the thyroid gland and adjacent soft
tissues was performed.

[Series 1: us thyroid · 0.06mm/px · 61 acquisitions, 13 frames shown]
[im 1/61]
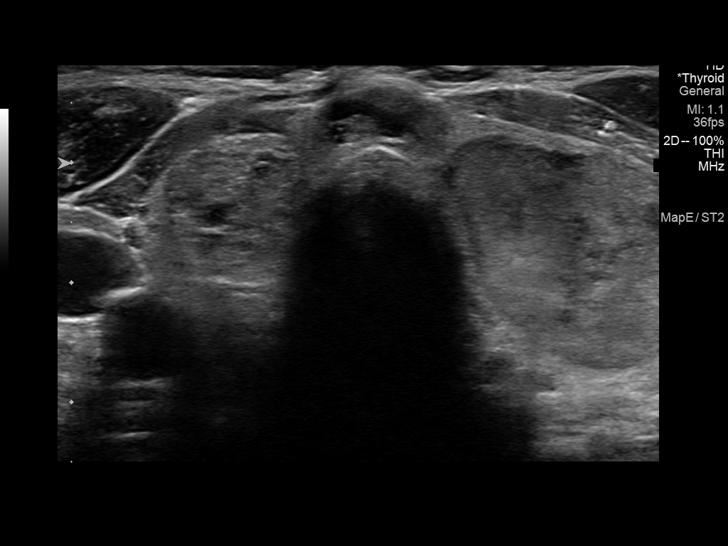
[im 6/61]
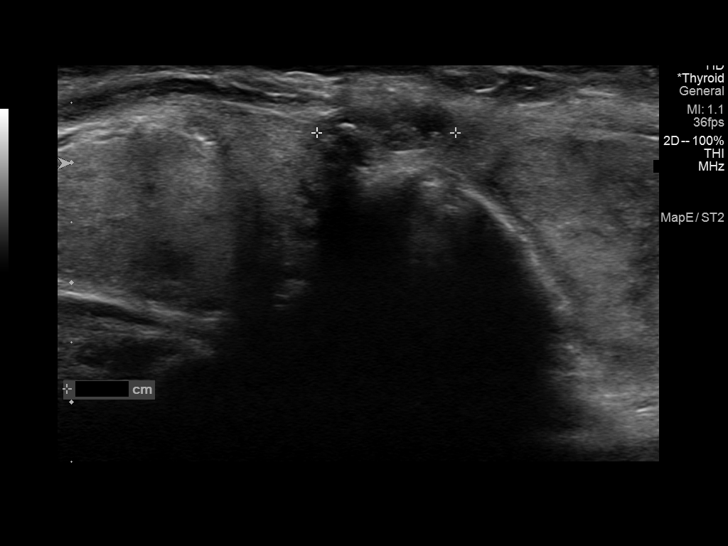
[im 11/61]
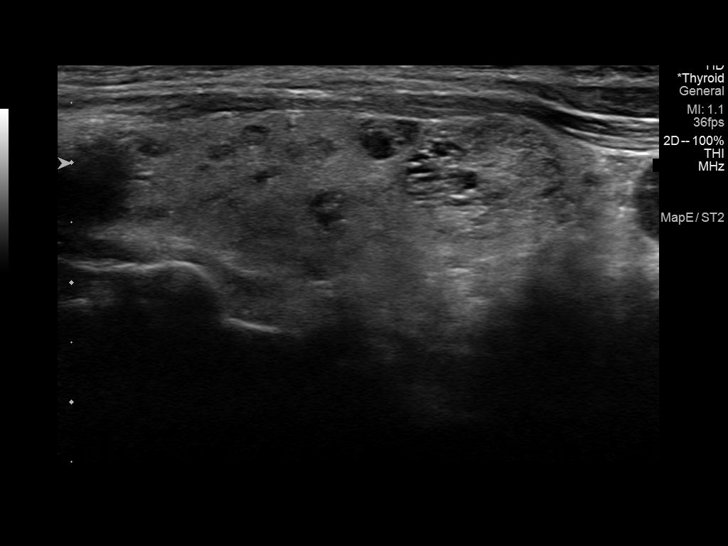
[im 16/61]
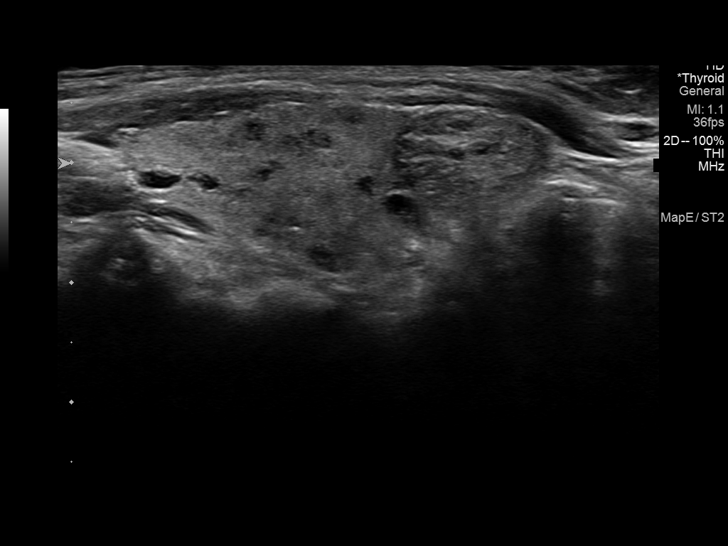
[im 21/61]
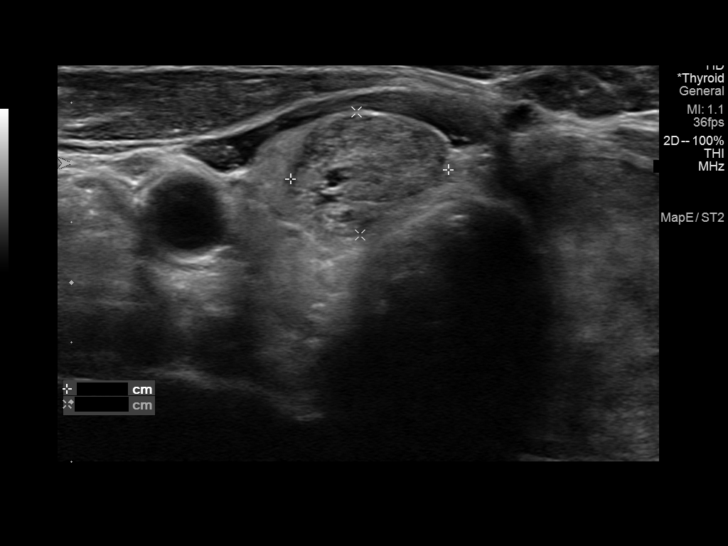
[im 26/61]
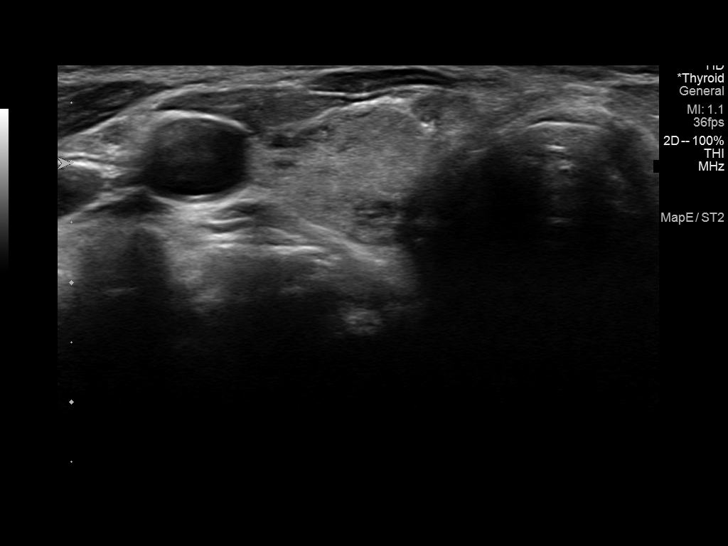
[im 31/61]
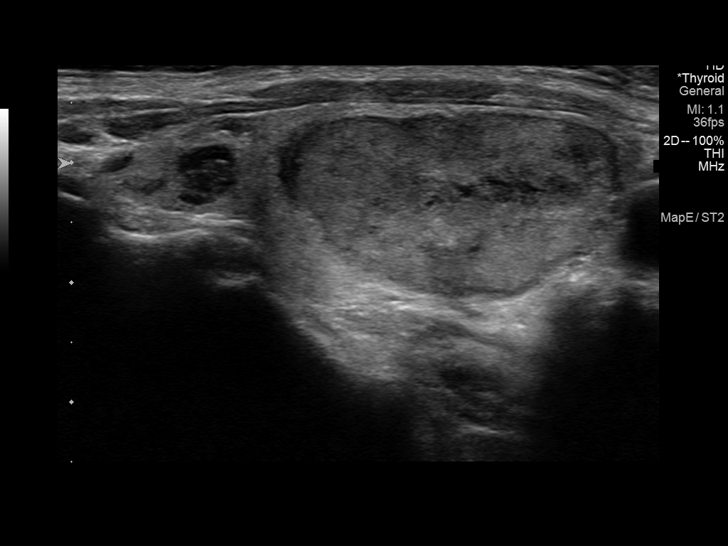
[im 36/61]
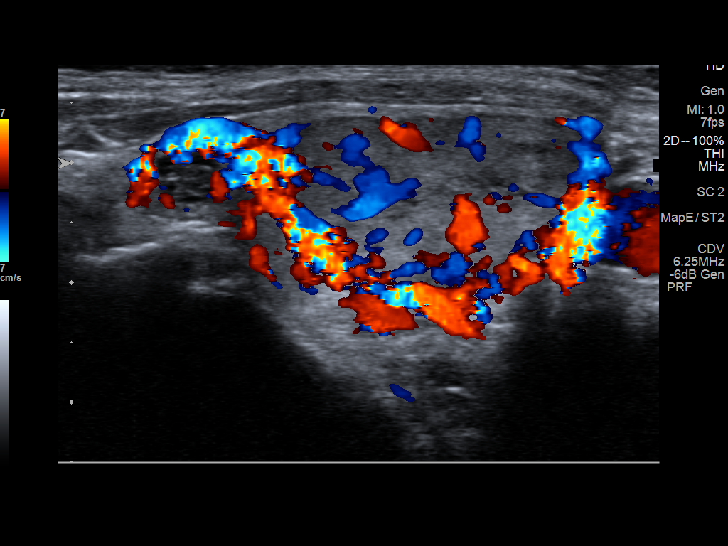
[im 41/61]
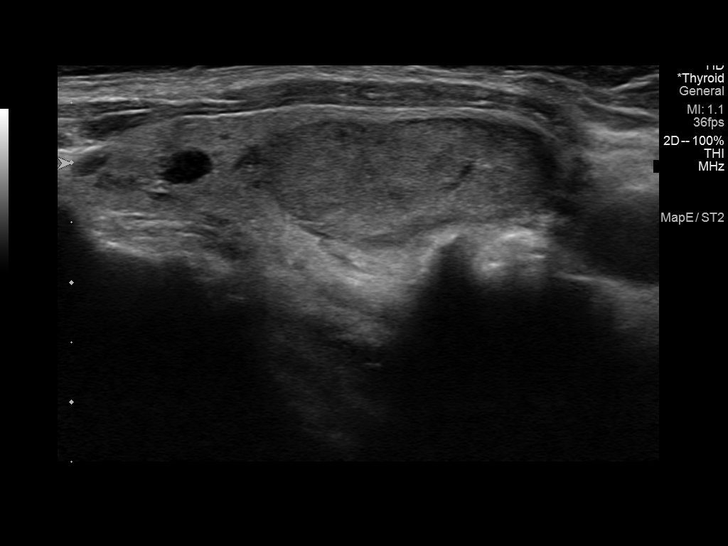
[im 46/61]
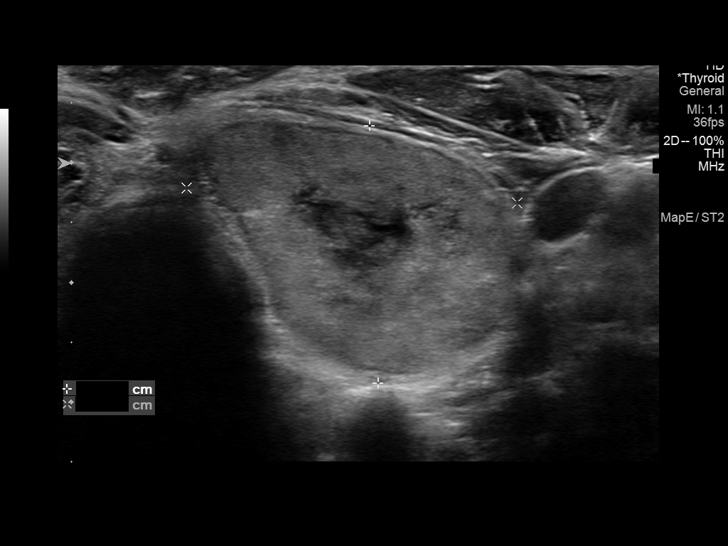
[im 51/61]
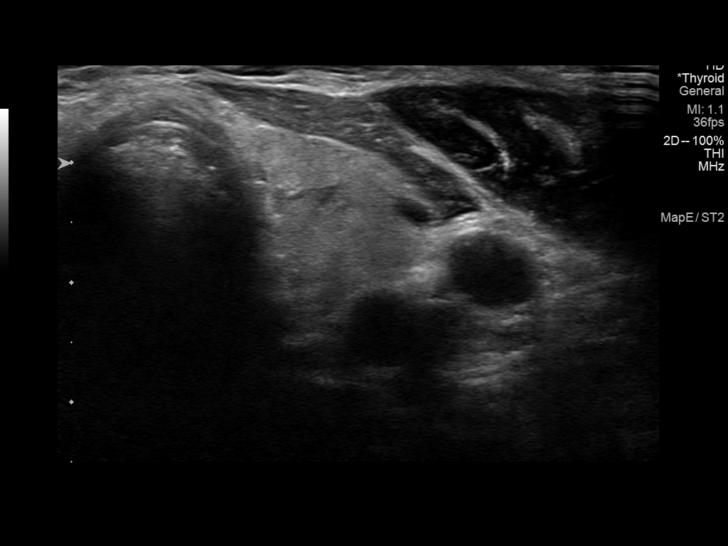
[im 56/61]
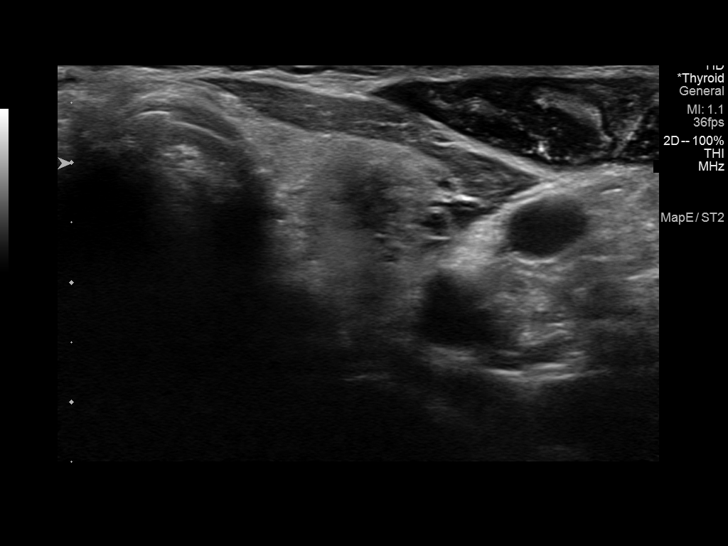
[im 61/61]
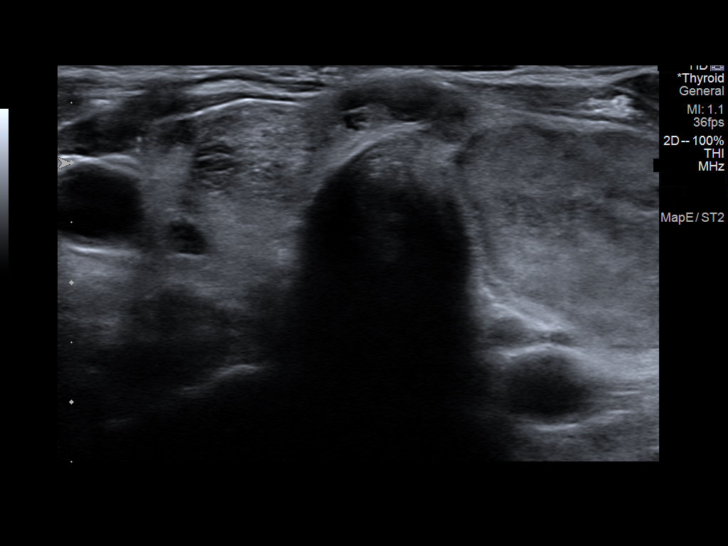

[13 of 25 positions shown; findings below may reference images not displayed]

FINDINGS: Parenchymal Echotexture: Moderately heterogenous

Isthmus: 0.6 cm

Right lobe: 4.3 cm x 1.6 cm x 1.9 cm

Left lobe: 4.6 cm x 2.2 cm x 2.8 cm

_________________________________________________________

Estimated total number of nodules >/= 1 cm: 2

Number of spongiform nodules >/=  2 cm not described below (TR1): 0

Number of mixed cystic and solid nodules >/= 1.5 cm not described
below (TR2): 0

_________________________________________________________

Nodule labeled 1 in the isthmus, 1.1 cm. This has been reported
previously and is partially cystic, with waxing/waning size. This
does not meet criteria for further surveillance or biopsy.

Nodule labeled 2 at the inferior right thyroid, unchanged,
spongiform, and does not meet criteria for surveillance or biopsy.

Nodule labeled 3 inferior left thyroid, previously biopsied
10/29/2018. This measures 3.2 cm on the current. Assuming a benign
result previously, no further specific follow-up would be indicated.

No adenopathy
IMPRESSION: Similar appearance of heterogeneous and multinodular thyroid.

Assuming a benign result of the previous left inferior thyroid
nodule biopsy, no further specific follow-up would be indicated.

Recommendations follow those established by the new ACR TI-RADS
criteria ([HOSPITAL] 7168;[DATE]).

## 2022-05-05 ENCOUNTER — Encounter: Payer: PPO | Admitting: Family Medicine

## 2022-05-16 ENCOUNTER — Other Ambulatory Visit: Payer: Self-pay | Admitting: Family Medicine

## 2022-05-16 ENCOUNTER — Ambulatory Visit (INDEPENDENT_AMBULATORY_CARE_PROVIDER_SITE_OTHER): Payer: PPO | Admitting: Family Medicine

## 2022-05-16 ENCOUNTER — Encounter: Payer: Self-pay | Admitting: Family Medicine

## 2022-05-16 DIAGNOSIS — I10 Essential (primary) hypertension: Secondary | ICD-10-CM

## 2022-05-16 DIAGNOSIS — Z0000 Encounter for general adult medical examination without abnormal findings: Secondary | ICD-10-CM | POA: Diagnosis not present

## 2022-05-16 NOTE — Progress Notes (Signed)
PATIENT CHECK-IN and HEALTH RISK ASSESSMENT QUESTIONNAIRE:  -completed by phone/video for upcoming Medicare Preventive Visit  Pre-Visit Check-in: 1)Vitals (height, wt, BP, etc) - record in vitals section for visit on day of visit 2)Review and Update Medications, Allergies PMH, Surgeries, Social history in Epic 3)Hospitalizations in the last year with date/reason?  Yes- 1 time due to dehydration in Nov 2023 at [facility] 4)Review and Update Care Team (patient's specialists) in [place] 5) Complete PHQ9 in Epic  6) Complete Fall Screening in [place] Maintenance Due and order under PCP if not done.  8)Medicare Wellness Questionnaire: Answer theses question about your habits: Do you drink alcohol? No    If yes, how many drinks do you have a day?N/A Have you ever smoked?No    Quit date if applicable?  N/A How many packs a day do/did you smoke? N/A Do you use smokeless tobacco?N/A Do you use an illicit drugs?N/A Do you exercises? Yes   IF so, what type and how many days/minutes per week?Walks dog 2 times per day around 1 1/2 hour per day! Are you sexually active? No  Number of partners? N/A Typical breakfast:oats, apple,yogurt  Typical lunch:protein, veg Typical dinner:protein , veg Typical snacks:nuts, cake  Beverages: coffee, tea, water  Answer theses question about you: Can you perform most household chores?Yes Do you find it hard to follow a conversation in a noisy room?No Do you often ask people to speak up or repeat themselves?yes Do you feel that you have a problem with memory?No Do you balance your checkbook and or bank acounts?Yes Do you feel safe at home?Yes Last dentist visit?12/2021 Do you need assistance with any of the following: Please note if so No   Driving?  Feeding yourself?  Getting from bed to chair?  Getting to the toilet?  Bathing or showering?  Dressing yourself?  Managing money?  Climbing a flight of stairs  Preparing meals?  Do you  have Advanced Directives in place (Living Will, Healthcare Power or Attorney)? No   Last eye Exam and location?20[address] on [address] in [place] you currently use prescribed or non-prescribed narcotic or opioid pain medications?No   Do you have a history or close family history of breast, ovarian, tubal or peritoneal cancer or a family member with BRCA (breast cancer susceptibility 1 and 2) gene mutations?  Nurse/Assistant Credentials/time stamp:  St    ----------------------------------------------------------------------------------------------------------------------------------------------------------------------------------------------------------------------   MEDICARE ANNUAL PREVENTIVE VISIT WITH PROVIDER: (Welcome to [employer], initial annual wellness or annual wellness exam)  Virtual Visit via Phone Note  I connected with   on 05/16/22 by phone  and verified that I am speaking with the correct person using two identifiers.  Location patient: home Location provider:work or home office Persons participating in the virtual visit: patient, provider  Concerns and/or follow up today: reports is doing well. No complaints.    See HM section in Epic for other details of completed HM.    ROS: negative for report of fevers, unintentional weight loss, vision changes, vision loss, hearing loss or change, chest pain, sob, hemoptysis, melena, hematochezia, hematuria, genital discharge or lesions, falls, bleeding or bruising, loc, thoughts of suicide or self harm, memory loss  Patient-completed extensive health risk assessment - reviewed and discussed with the patient: See Health Risk Assessment completed with patient prior to the visit either above or in recent phone note. This was reviewed in detailed with the patient today and appropriate recommendations, orders and referrals were placed as needed per  Summary below and patient instructions.   Review of Medical History: -PMH,  PSH, Family History and current specialty and care providers reviewed and updated and listed below   Patient Care Team: [place], Betty G, MD as PCP - General (Family Medicine)   Past Medical History:  Diagnosis Date   High cholesterol    Hypertension    Osteoarthritis    Raynaud disease     Past Surgical History:  Procedure Laterality Date   TUBAL LIGATION      Social History   Socioeconomic History   Marital status: Widowed    Spouse name: Not on file   Number of children: Not on file   Years of education: Not on file   Highest education level: Master's degree (e.g., MA, MS, MEng, MEd, MSW, MBA)  Occupational History   Not on file  Tobacco Use   Smoking status: Never   Smokeless tobacco: Never  Vaping Use   Vaping Use: Never used  Substance and Sexual Activity   Alcohol use: Yes    Comment: social   Drug use: No   Sexual activity: Not Currently  Other Topics Concern   Not on file  Social History Narrative   Not on file   Social Determinants of Health   Financial Resource Strain: Low Risk  (06/16/2021)   Overall Financial Resource Strain (CARDIA)    Difficulty of Paying Living Expenses: Not hard at all  Food Insecurity: No Food Insecurity (06/16/2021)   Hunger Vital Sign    Worried About Running Out of Food in the Last Year: Never true    [place] in the Last Year: Never true  Transportation Needs: No Transportation Needs (06/16/2021)   PRAPARE - Hydrologist (Medical): No    Lack of Transportation (Non-Medical): No  Physical Activity: Sufficiently Active (06/16/2021)   Exercise Vital Sign    Days of Exercise per Week: 7 days    Minutes of Exercise per Session: 100 min  Stress: No Stress Concern Present (06/16/2021)   [place]    Feeling of Stress : Not at all  Social Connections: Unknown (06/16/2021)   Social Connection and Isolation Panel [NHANES]     Frequency of Communication with Friends and Family: More than three times a week    Frequency of Social Gatherings with Friends and Family: Not on file    Attends Religious Services: Patient refused    Active Member of Clubs or Organizations: No    Attends Archivist Meetings: Not on file    Marital Status: Widowed  Human resources officer Violence: Not on file    Family History  Problem Relation Age of Onset   Heart disease Father    Thyroid disease Neg Hx     Current Outpatient Medications on File Prior to Visit  Medication Sig Dispense Refill   amLODipine (NORVASC) 10 MG tablet TAKE 1 TABLET BY MOUTH EVERY DAY (Patient taking differently: Take 10 mg by mouth daily.) 90 tablet 3   Ascorbic Acid (VITAMIN C) 250 MG CHEW Chew 250 mg by mouth daily.     aspirin EC 81 MG tablet Take 81 mg by mouth daily.     Calcium Carbonate Antacid (TUMS PO) Take 2 tablets by mouth daily as needed (heartburn).     Calcium-Phosphorus-Vitamin D (CALCIUM GUMMIES) [id number] MG-MG-UNIT CHEW Chew 2 tablets by mouth daily.     Cyanocobalamin (VITAMIN B-12) 5000 MCG TBDP Take  5,000 Units by mouth every 3 (three) days.     losartan (COZAAR) 50 MG tablet Take 1 tablet (50 mg total) by mouth daily. 90 tablet 1   MAGNESIUM OXIDE PO Take 0.5 tablets by mouth daily.     MYRBETRIQ 50 MG TB24 tablet Take 50 mg by mouth daily.     Polyethylene Glycol 3350 (MIRALAX PO) Take 17 g by mouth as needed (constipation).     pravastatin (PRAVACHOL) 20 MG tablet Take 1 tablet (20 mg total) by mouth daily. 90 tablet 1   risedronate (ACTONEL) 35 MG tablet Take 1 tablet (35 mg total) by mouth every 7 (seven) days. with water on empty stomach, nothing by mouth or lie down for next 30 minutes. 13 tablet 3   No current facility-administered medications on file prior to visit.    Allergies  Allergen Reactions   Prednisone Other (See Comments)    Dehydration, amnesia    Ciprofloxacin Other (See Comments)    GI issues    Ibuprofen Nausea Only and Other (See Comments)    Nausea & headaches       Physical Exam There were no vitals filed for this visit. Estimated body mass index is 21.26 kg/m as calculated from the following:   Height as of 03/13/22: 5' 2"$  (1.575 m).   Weight as of 03/13/22: 116 lb 4 oz (52.7 kg).  EKG (optional): deferred due to virtual visit  GENERAL: alert, oriented, no acute distress detected, full vision exam deferred due to pandemic and/or virtual encounter  PSYCH/NEURO: pleasant and cooperative, no obvious depression or anxiety, speech and thought processing grossly intact, Cognitive function grossly intact  [place] [facility] Visit from 05/16/2022 in [place] [facility] [place]  PHQ-9 Total Score 0           05/16/2022   10:41 AM 03/13/2022    9:29 AM 02/14/2022   11:33 AM 11/21/2021    4:17 PM 11/01/2021    9:35 AM  Depression screen PHQ 2/9  Decreased Interest 0 0 0 0 0  Down, Depressed, Hopeless 0 0 0 0 0  PHQ - 2 Score 0 0 0 0 0  Altered sleeping 0   0 0  Tired, decreased energy 0   0 0  Change in appetite 0   0 0  Feeling bad or failure about yourself  0   0 0  Trouble concentrating 0   0 0  Moving slowly or fidgety/restless 0   0 0  Suicidal thoughts 0   0 0  PHQ-9 Score 0   0 0  Difficult doing work/chores Not difficult at all   Not difficult at all Not difficult at all       02/10/2022    8:00 PM 02/11/2022    8:00 AM 02/14/2022   11:33 AM 03/13/2022    9:28 AM 05/16/2022   10:41 AM  Fall Risk  Falls in the past year?   0 0 0  Was there an injury with Fall?   0 0 0  Fall Risk Category Calculator   0 0 0  Fall Risk Category (Retired)   Low Low   (RETIRED) Patient Fall Risk Level High fall risk High fall risk High fall risk Low fall risk   Patient at Risk for Falls Due to   History of fall(s);Impaired balance/gait No Fall Risks   Fall risk Follow up   Falls evaluation completed Falls evaluation completed      SUMMARY AND  PLAN:  Encounter for Medicare annual wellness exam   Discussed applicable health maintenance/preventive health measures and advised and referred or ordered per patient preferences:  Health Maintenance  Topic Date Due   COVID-19 Vaccine (7 - 2023-24 season) 01/30/2023 (Originally 03/15/2022)   Medicare Annual Wellness (AWV)  05/17/2023   DTaP/Tdap/Td (2 - Td or Tdap) 09/28/2026   Pneumonia Vaccine 12+ Years old  Completed   INFLUENZA VACCINE  Completed   DEXA SCAN  Completed   Zoster Vaccines- Shingrix  Completed   HPV VACCINES  Aged Out  She is doing her bone density tomorrow.   Education and counseling on the following was provided based on the above review of health and a plan/checklist for the patient, along with additional information discussed, was provided for the patient in the patient instructions :  -Advised on importance of and resources for completing advanced directives - included info in hand out in my chart as well -congratulated on regular exercise and healthy diet - encouraged to continue, discussed adding some strength building exercises - light weight 2lb wts and videos at home or silver sneakers -Advise yearly dental visits at minimum and regular eye exams   Follow up: see patient instructions     Patient Instructions  I really enjoyed getting to talk with you today! I am available on Tuesdays and Thursdays for virtual visits if you have any questions or concerns, or if I can be of any further assistance.   CHECKLIST FROM ANNUAL WELLNESS VISIT:  -Follow up (please call to schedule if not scheduled after visit):   -yearly for annual wellness visit with primary care office  Here is a list of your preventive care/health maintenance measures and the plan for each if any are due:  Health Maintenance  Topic Date Due   COVID-19 Vaccine (7 - 2023-24 season) 01/30/2023 (Originally 03/15/2022)   Medicare Annual Wellness (AWV)  05/17/2023   DTaP/Tdap/Td (2 - Td or  Tdap) 09/28/2026   Pneumonia Vaccine 63+ Years old  Completed   INFLUENZA VACCINE  Completed   DEXA SCAN  Completed   Zoster Vaccines- Shingrix  Completed   HPV VACCINES  Aged Out    -See a dentist at least yearly  -Get your eyes checked and then per your eye specialist's recommendations  -Other issues addressed today:  -I have included below further information regarding a healthy whole foods based diet, physical activity guidelines for adults, stress management and opportunities for social connections. I hope you find this information useful.   -----------------------------------------------------------------------------------------------------------------------------------------------------------------------------------------------------------------------------------------------------------  NUTRITION: -eat real food: lots of colorful vegetables (half the plate) and fruits -5-7 servings of vegetables and fruits per day (fresh or steamed is best), exp. 2 servings of vegetables with lunch and dinner and 2 servings of fruit per day. Berries and greens such as kale and collards are great choices.  -consume on a regular basis: whole grains (make sure first ingredient on label contains the word "whole"), fresh fruits, fish, nuts, seeds, healthy oils (such as olive oil, avocado oil, grape seed oil) -may eat small amounts of dairy and lean meat on occasion, but avoid processed meats such as ham, bacon, lunch meat, etc. -drink water -try to avoid fast food and pre-packaged foods, processed meat -most experts advise limiting sodium to < 2376m per day, should limit further is any chronic conditions such as high blood pressure, heart disease, diabetes, etc. The American Heart Association advised that < 15041mis is ideal -try to avoid foods that contain any  ingredients with names you do not recognize  -try to avoid sugar/sweets (except for the natural sugar that occurs in fresh fruit) -try to  avoid sweet drinks -try to avoid white rice, white bread, pasta (unless whole grain), white or yellow potatoes  EXERCISE GUIDELINES FOR ADULTS: -if you wish to increase your physical activity, do so gradually and with the approval of your doctor -STOP and seek medical care immediately if you have any chest pain, chest discomfort or trouble breathing when starting or increasing exercise  -move and stretch your body, legs, feet and arms when sitting for long periods -Physical activity guidelines for optimal health in adults: -least 150 minutes per week of aerobic exercise (can talk, but not sing) once approved by your doctor, 20-30 minutes of sustained activity or two 10 minute episodes of sustained activity every day.  -resistance training at least 2 days per week if approved by your doctor -balance exercises 3+ days per week:   Stand somewhere where you have something sturdy to hold onto if you lose balance.    1) lift up on toes, start with 5x per day and work up to 20x   2) stand and lift on leg straight out to the side so that foot is a few inches of the floor, start with 5x each side and work up to 20x each side   3) stand on one foot, start with 5 seconds each side and work up to 20 seconds on each side  If you need ideas or help with getting more active:  -Silver sneakers https://tools.silversneakers.com  -Walk with a Doc: http://stephens-thompson.biz/  -try to include resistance (weight lifting/strength building) and balance exercises twice per week: or the following link for ideas: ChessContest.fr  UpdateClothing.com.cy  STRESS MANAGEMENT: -can try meditating, or just sitting quietly with deep breathing while intentionally relaxing all parts of your body for 5 minutes daily -if you need further help with stress, anxiety or depression please follow up with your primary doctor or contact the  wonderful folks at [place]: [place]: -options in [place] if you wish to engage in more social and exercise related activities:  -Silver sneakers https://tools.silversneakers.com  -Walk with a Doc: http://stephens-thompson.biz/  -Check out the [place] 50+ section on the [place] [employer] (hiking clubs, book clubs, cards and games, chess, exercise classes, aquatic classes and much more) - see the website for details: https://www.[place].gov/departments/parks-recreation/active-adults50  -YouTube has lots of exercise videos for different ages and abilities as well  -[place] (a variety of indoor and outdoor inperson activities for adults). 843-205-5195. [address]  -Virtual Online Classes (a variety of topics): see seniorplanet.org or call 978-595-5436  -consider volunteering at a school, hospice center, church, senior center or elsewhere    ADVANCED HEALTHCARE DIRECTIVES:  Everyone should have advanced health care directives in place. This is so that you get the care you want, should you ever be in a situation where you are unable to make your own medical decisions.   From the [place] Advanced Directive Website: "[place] are legal documents in which you give written instructions about your health care if, in the future, you cannot speak for yourself.   A health care power of attorney allows you to name a person you trust to make your health care decisions if you cannot make them yourself. A declaration of a desire for a natural death (or living will) is document, which states that you desire  not to have your life prolonged by extraordinary measures if you have a terminal or incurable illness or if you are in a vegetative state. An advance instruction for mental health treatment makes a declaration of instructions, information and preferences regarding your mental health  treatment. It also states that you are aware that the advance instruction authorizes a mental health treatment provider to act according to your wishes. It may also outline your consent or refusal of mental health treatment. A declaration of an anatomical gift allows anyone over the age of 51 to make a gift by will, organ donor card or other document."   Please see the following website or an elder law attorney for forms, FAQs and for completion of advanced directives: [place] Secretary of [place] ([email or web address].no)  Or copy and paste the following to your web browser: PokerReunion.com.cy    Molly Kern, DO

## 2022-05-16 NOTE — Patient Instructions (Addendum)
I really enjoyed getting to talk with you today! I am available on Tuesdays and Thursdays for virtual visits if you have any questions or concerns, or if I can be of any further assistance.   CHECKLIST FROM ANNUAL WELLNESS VISIT:  -Follow up (please call to schedule if not scheduled after visit):   -yearly for annual wellness visit with primary care office  Here is a list of your preventive care/health maintenance measures and the plan for each if any are due:  Health Maintenance  Topic Date Due   COVID-19 Vaccine (7 - 2023-24 season) 01/30/2023 (Originally 03/15/2022)   Medicare Annual Wellness (AWV)  05/17/2023   DTaP/Tdap/Td (2 - Td or Tdap) 09/28/2026   Pneumonia Vaccine 8+ Years old  Completed   INFLUENZA VACCINE  Completed   DEXA SCAN  Completed   Zoster Vaccines- Shingrix  Completed   HPV VACCINES  Aged Out    -See a dentist at least yearly  -Get your eyes checked and then per your eye specialist's recommendations  -Other issues addressed today:  -I have included below further information regarding a healthy whole foods based diet, physical activity guidelines for adults, stress management and opportunities for social connections. I hope you find this information useful.   -----------------------------------------------------------------------------------------------------------------------------------------------------------------------------------------------------------------------------------------------------------  NUTRITION: -eat real food: lots of colorful vegetables (half the plate) and fruits -5-7 servings of vegetables and fruits per day (fresh or steamed is best), exp. 2 servings of vegetables with lunch and dinner and 2 servings of fruit per day. Berries and greens such as kale and collards are great choices.  -consume on a regular basis: whole grains (make sure first ingredient on label contains the word "whole"), fresh fruits, fish, nuts, seeds, healthy oils  (such as olive oil, avocado oil, grape seed oil) -may eat small amounts of dairy and lean meat on occasion, but avoid processed meats such as ham, bacon, lunch meat, etc. -drink water -try to avoid fast food and pre-packaged foods, processed meat -most experts advise limiting sodium to < 2390m per day, should limit further is any chronic conditions such as high blood pressure, heart disease, diabetes, etc. The American Heart Association advised that < 15056mis is ideal -try to avoid foods that contain any ingredients with names you do not recognize  -try to avoid sugar/sweets (except for the natural sugar that occurs in fresh fruit) -try to avoid sweet drinks -try to avoid white rice, white bread, pasta (unless whole grain), white or yellow potatoes  EXERCISE GUIDELINES FOR ADULTS: -if you wish to increase your physical activity, do so gradually and with the approval of your doctor -STOP and seek medical care immediately if you have any chest pain, chest discomfort or trouble breathing when starting or increasing exercise  -move and stretch your body, legs, feet and arms when sitting for long periods -Physical activity guidelines for optimal health in adults: -least 150 minutes per week of aerobic exercise (can talk, but not sing) once approved by your doctor, 20-30 minutes of sustained activity or two 10 minute episodes of sustained activity every day.  -resistance training at least 2 days per week if approved by your doctor -balance exercises 3+ days per week:   Stand somewhere where you have something sturdy to hold onto if you lose balance.    1) lift up on toes, start with 5x per day and work up to 20x   2) stand and lift on leg straight out to the side so that foot is a few  inches of the floor, start with 5x each side and work up to 20x each side   3) stand on one foot, start with 5 seconds each side and work up to 20 seconds on each side  If you need ideas or help with getting more  active:  -Silver sneakers https://tools.silversneakers.com  -Walk with a Doc: http://stephens-thompson.biz/  -try to include resistance (weight lifting/strength building) and balance exercises twice per week: or the following link for ideas: ChessContest.fr  UpdateClothing.com.cy  STRESS MANAGEMENT: -can try meditating, or just sitting quietly with deep breathing while intentionally relaxing all parts of your body for 5 minutes daily -if you need further help with stress, anxiety or depression please follow up with your primary doctor or contact the wonderful folks at [place]: [place]: -options in [place] if you wish to engage in more social and exercise related activities:  -Silver sneakers https://tools.silversneakers.com  -Walk with a Doc: http://stephens-thompson.biz/  -Check out the [place] 50+ section on the [place] [employer] (hiking clubs, book clubs, cards and games, chess, exercise classes, aquatic classes and much more) - see the website for details: https://www.[place].gov/departments/parks-recreation/active-adults50  -YouTube has lots of exercise videos for different ages and abilities as well  -[place] (a variety of indoor and outdoor inperson activities for adults). [phone]. [address].  -Virtual Online Classes (a variety of topics): see seniorplanet.org or call ([phone]  -consider volunteering at a school, hospice center, church, senior center or elsewhere    ADVANCED HEALTHCARE DIRECTIVES:  Everyone should have advanced health care directives in place. This is so that you get the care you want, should you ever be in a situation where you are unable to make your own medical decisions.   From the [place] Advanced Directive Website: "[place] are  legal documents in which you give written instructions about your health care if, in the future, you cannot speak for yourself.   A health care power of attorney allows you to name a person you trust to make your health care decisions if you cannot make them yourself. A declaration of a desire for a natural death (or living will) is document, which states that you desire not to have your life prolonged by extraordinary measures if you have a terminal or incurable illness or if you are in a vegetative state. An advance instruction for mental health treatment makes a declaration of instructions, information and preferences regarding your mental health treatment. It also states that you are aware that the advance instruction authorizes a mental health treatment provider to act according to your wishes. It may also outline your consent or refusal of mental health treatment. A declaration of an anatomical gift allows anyone over the age of 65 to make a gift by will, organ donor card or other document."   Please see the following website or an elder law attorney for forms, FAQs and for completion of advanced directives: [place] Secretary of [place] ([email or web address])  Or copy and paste the following to your web browser: [email or web address]

## 2022-05-22 ENCOUNTER — Encounter: Payer: Self-pay | Admitting: *Deleted

## 2022-05-24 DIAGNOSIS — M85852 Other specified disorders of bone density and structure, left thigh: Secondary | ICD-10-CM | POA: Diagnosis not present

## 2022-05-24 DIAGNOSIS — M810 Age-related osteoporosis without current pathological fracture: Secondary | ICD-10-CM | POA: Diagnosis not present

## 2022-05-24 LAB — HM DEXA SCAN

## 2022-05-26 ENCOUNTER — Telehealth: Payer: Self-pay | Admitting: Family Medicine

## 2022-05-26 NOTE — Telephone Encounter (Signed)
This has already been updated.

## 2022-05-26 NOTE — Telephone Encounter (Signed)
Pt states per MyChart:  I was told that Lisinipril was listed as a medication I should not take because of coughfng and snot crap in my nostrils.   I have corrected it at [facility] to put on my record.  Please make sure it is an allergy and I don't want to take that medication ever again.

## 2022-05-29 ENCOUNTER — Encounter: Payer: Self-pay | Admitting: Family Medicine

## 2022-05-31 ENCOUNTER — Other Ambulatory Visit: Payer: Self-pay | Admitting: Family Medicine

## 2022-05-31 DIAGNOSIS — I10 Essential (primary) hypertension: Secondary | ICD-10-CM

## 2022-06-12 NOTE — Telephone Encounter (Signed)
Pt dropped off letter concerning Dr. Martinique feed back. Would like Molly Wilcox to call her since she has questions and is unable to get into her mychart.   Letter in folder.   Please advise.

## 2022-06-12 NOTE — Telephone Encounter (Signed)
On pcp's desk for review.

## 2022-06-14 NOTE — Telephone Encounter (Signed)
I spoke with patient, moved appt up and will start prolia process.

## 2022-06-18 DIAGNOSIS — R339 Retention of urine, unspecified: Secondary | ICD-10-CM

## 2022-06-21 ENCOUNTER — Other Ambulatory Visit: Payer: Self-pay | Admitting: Family Medicine

## 2022-06-21 DIAGNOSIS — E7800 Pure hypercholesterolemia, unspecified: Secondary | ICD-10-CM

## 2022-06-23 NOTE — Progress Notes (Unsigned)
HPI: Molly Wilcox is a 82 y.o. female, who is here today for chronic disease management.  Last seen on 03/13/22 No new problems since her last visit.  Hypertension: Currently she is on amlodipine 10 mg daily and losartan 50 mg daily. She reports a return to baseline health and has been self-monitoring her blood pressure at home. The readings have been mostly within the 120s and 130s/70's, SBP occasionally in the low 140's. Negative for unusual or severe headache, visual changes, exertional chest pain, dyspnea,  focal weakness, or edema.  Lab Results  Component Value Date   CREATININE 0.89 03/03/2022   BUN 32 (H) 03/03/2022   NA 138 03/03/2022   K 4.3 03/03/2022   CL 105 03/03/2022   CO2 26 03/03/2022   Hyperlipidemia: Currently she is on pravastatin 20 mg daily. Aortic atherosclerosis seen on abdominal CT in 02/2022. She acknowledges the consumption of unhealthy foods during a recent stay with her daughter ,assisting her with moving to a new place, but also reports increased physical activity, including stair climbing.  Lab Results  Component Value Date   CHOL 152 05/04/2021   HDL 61.50 05/04/2021   LDLCALC 75 05/04/2021   TRIG 81.0 05/04/2021   CHOLHDL 2 05/04/2021   Osteoporosis: She has been on Fosamax and most recently Actonel.  She has elected to resume Prolia treatment , which she has taken before but discontinued due to cost. She is also taking vitamin D and calcium supplements. DEXA 05/24/22 showed some areas with worsening problem (L2-L4 -3.0 and left femur -2.5).  Review of Systems  Constitutional:  Negative for chills and fever.  HENT:  Negative for mouth sores and sore throat.   Respiratory:  Negative for cough and wheezing.   Gastrointestinal:  Negative for abdominal pain, nausea and vomiting.  Endocrine: Negative for cold intolerance and heat intolerance.  Genitourinary:  Negative for decreased urine volume, dysuria and hematuria.  Musculoskeletal:   Negative for back pain and myalgias.  Neurological:  Negative for syncope and facial asymmetry.  Psychiatric/Behavioral:  Negative for confusion. The patient is not nervous/anxious.   See other pertinent positives and negatives in HPI.  Current Outpatient Medications on File Prior to Visit  Medication Sig Dispense Refill   amLODipine (NORVASC) 10 MG tablet TAKE 1 TABLET BY MOUTH EVERY DAY 90 tablet 3   Ascorbic Acid (VITAMIN C) 250 MG CHEW Chew 250 mg by mouth daily.     aspirin EC 81 MG tablet Take 81 mg by mouth daily.     Calcium Carbonate Antacid (TUMS PO) Take 2 tablets by mouth daily as needed (heartburn).     Calcium-Phosphorus-Vitamin D (CALCIUM GUMMIES) [id number] MG-MG-UNIT CHEW Chew 2 tablets by mouth daily.     losartan (COZAAR) 50 MG tablet Take 1 tablet (50 mg total) by mouth daily. 90 tablet 1   MAGNESIUM OXIDE PO Take 0.5 tablets by mouth daily.     MYRBETRIQ 50 MG TB24 tablet Take 50 mg by mouth daily.     Polyethylene Glycol 3350 (MIRALAX PO) Take 17 g by mouth as needed (constipation).     pravastatin (PRAVACHOL) 20 MG tablet TAKE 1 TABLET BY MOUTH EVERY DAY 90 tablet 1   No current facility-administered medications on file prior to visit.   Past Medical History:  Diagnosis Date   High cholesterol    Hypertension    Osteoarthritis    Raynaud disease    Allergies  Allergen Reactions   Prednisone Other (See Comments)  Dehydration, amnesia    Ciprofloxacin Other (See Comments)    GI issues   Lisinopril-Hydrochlorothiazide Cough   Ibuprofen Nausea Only and Other (See Comments)    Nausea & headaches   Social History   Socioeconomic History   Marital status: Widowed    Spouse name: Not on file   Number of children: Not on file   Years of education: Not on file   Highest education level: Bachelor's degree (e.g., BA, AB, BS)  Occupational History   Not on file  Tobacco Use   Smoking status: Never   Smokeless tobacco: Never  Vaping Use   Vaping Use:  Never used  Substance and Sexual Activity   Alcohol use: Yes    Comment: social   Drug use: No   Sexual activity: Not Currently  Other Topics Concern   Not on file  Social History Narrative   Not on file   Social Determinants of Health   Financial Resource Strain: Low Risk  (06/16/2021)   Overall Financial Resource Strain (CARDIA)    Difficulty of Paying Living Expenses: Not hard at all  Food Insecurity: Unknown (06/22/2022)   Hunger Vital Sign    Worried About Running Out of Food in the Last Year: Never true    Ran Out of Food in the Last Year: Not on file  Transportation Needs: No Transportation Needs (06/22/2022)   PRAPARE - Hydrologist (Medical): No    Lack of Transportation (Non-Medical): No  Physical Activity: Sufficiently Active (06/22/2022)   Exercise Vital Sign    Days of Exercise per Week: 7 days    Minutes of Exercise per Session: 90 min  Stress: No Stress Concern Present (06/22/2022)   [place]    Feeling of Stress : Not at all  Social Connections: Unknown (06/22/2022)   Social Connection and Isolation Panel [NHANES]    Frequency of Communication with Friends and Family: More than three times a week    Frequency of Social Gatherings with Friends and Family: Once a week    Attends Religious Services: Never    Marine scientist or Organizations: Not on file    Attends Archivist Meetings: Not on file    Marital Status: Widowed   Vitals:   06/26/22 0851  BP: 128/72  Pulse: 75  Resp: 16  SpO2: 98%  Body mass index is 21.45 kg/m.  Physical Exam Vitals and nursing note reviewed.  Constitutional:      General: She is not in acute distress.    Appearance: She is well-developed.  HENT:     Head: Normocephalic and atraumatic.     Mouth/Throat:     Mouth: Mucous membranes are moist.     Pharynx: Oropharynx is clear.  Eyes:     Conjunctiva/sclera:  Conjunctivae normal.  Cardiovascular:     Rate and Rhythm: Normal rate and regular rhythm.     Pulses:          Dorsalis pedis pulses are 2+ on the right side and 2+ on the left side.     Heart sounds: No murmur heard. Pulmonary:     Effort: Pulmonary effort is normal. No respiratory distress.     Breath sounds: Normal breath sounds.  Abdominal:     Palpations: Abdomen is soft. There is no hepatomegaly or mass.     Tenderness: There is no abdominal tenderness.  Musculoskeletal:     Right lower  leg: 1+ Pitting Edema present.     Left lower leg: 1+ Pitting Edema present.  Lymphadenopathy:     Cervical: No cervical adenopathy.  Skin:    General: Skin is warm.     Findings: No erythema or rash.  Neurological:     General: No focal deficit present.     Mental Status: She is alert and oriented to person, place, and time.     Cranial Nerves: No cranial nerve deficit.     Gait: Gait normal.     Comments: Mildly unstable gait, not assisted.  Psychiatric:        Mood and Affect: Mood and affect normal.   ASSESSMENT AND PLAN:  Molly Wilcox was seen today for medical management of chronic issues.  Diagnoses and all orders for this visit:  Essential hypertension Assessment & Plan: BP adequately controlled. Continue amlodipine 10 mg daily and losartan 50 mg daily as well as low-salt diet. Continue monitoring BP regularly. Eye exam is current.  Orders: -     Basic metabolic panel; Future  Localized osteoporosis without current pathological fracture Assessment & Plan: DEXA 05/24/22 showed some areas with worsening problem (L2-L4 -3.0 and left femur -2.5). She did try Fosamax and Actonel. She has decided to resume Prolia, injection given today. Continue adequate calcium and vitamin D intake. Continue regular physical activity that includes weightbearing exercise. Fall prevention. BMP and 25 OH vitamin D ordered today.  Orders: -     Denosumab  Vitamin D deficiency Assessment &  Plan: Continue current dose of vitamin D supplementation. Further recommendation will be given according to 25 OH vitamin D result.  Orders: -     VITAMIN D 25 Hydroxy (Vit-D Deficiency, Fractures); Future  Atherosclerosis of aorta [facility]) Assessment & Plan: Seen on abdominal CT in 02/2022. Last LDL 75 in 05/2021. Currently on pravastatin 20 mg daily.  Orders: -     Lipid panel; Future  Pure hypercholesterolemia Assessment & Plan: Continue pravastatin 20 mg daily and low-fat diet. Further recommendation will be given according to lipid panel result.   Return in about 6 months (around 12/27/2022) for chronic problems.  Jamauri Kruzel G. [place], MD  [facility]. [place] office.

## 2022-06-26 ENCOUNTER — Ambulatory Visit (INDEPENDENT_AMBULATORY_CARE_PROVIDER_SITE_OTHER): Payer: PPO | Admitting: Family Medicine

## 2022-06-26 ENCOUNTER — Encounter: Payer: Self-pay | Admitting: Family Medicine

## 2022-06-26 VITALS — BP 128/72 | HR 75 | Resp 16 | Ht 62.0 in | Wt 117.2 lb

## 2022-06-26 DIAGNOSIS — E7800 Pure hypercholesterolemia, unspecified: Secondary | ICD-10-CM | POA: Diagnosis not present

## 2022-06-26 DIAGNOSIS — E559 Vitamin D deficiency, unspecified: Secondary | ICD-10-CM | POA: Diagnosis not present

## 2022-06-26 DIAGNOSIS — I10 Essential (primary) hypertension: Secondary | ICD-10-CM | POA: Diagnosis not present

## 2022-06-26 DIAGNOSIS — I700 Atherosclerosis of aorta: Secondary | ICD-10-CM | POA: Diagnosis not present

## 2022-06-26 DIAGNOSIS — M816 Localized osteoporosis [Lequesne]: Secondary | ICD-10-CM

## 2022-06-26 DIAGNOSIS — E119 Type 2 diabetes mellitus without complications: Secondary | ICD-10-CM | POA: Insufficient documentation

## 2022-06-26 LAB — BASIC METABOLIC PANEL
BUN: 26 mg/dL — ABNORMAL HIGH (ref 6–23)
CO2: 27 mEq/L (ref 19–32)
Calcium: 10.3 mg/dL (ref 8.4–10.5)
Chloride: 101 mEq/L (ref 96–112)
Creatinine, Ser: 0.78 mg/dL (ref 0.40–1.20)
GFR: 70.7 mL/min (ref >60.00)
Glucose, Bld: 80 mg/dL (ref 70–99)
Potassium: 4.1 mEq/L (ref 3.5–5.1)
Sodium: 139 mEq/L (ref 135–145)

## 2022-06-26 LAB — LIPID PANEL
Cholesterol: 158 mg/dL (ref 0–200)
HDL: 70.3 mg/dL (ref >39.00)
LDL Cholesterol: 77 mg/dL (ref 0–99)
NonHDL: 88.1
Total CHOL/HDL Ratio: 2
Triglycerides: 55 mg/dL (ref 0.0–149.0)
VLDL: 11 mg/dL (ref 0.0–40.0)

## 2022-06-26 MED ORDER — DENOSUMAB 60 MG/ML ~~LOC~~ SOSY
60.0000 mg | PREFILLED_SYRINGE | Freq: Once | SUBCUTANEOUS | Status: AC
  Administered 2022-06-26: 60 mg via SUBCUTANEOUS

## 2022-06-26 NOTE — Assessment & Plan Note (Signed)
Seen on abdominal CT in 02/2022. Last LDL 75 in 05/2021. Currently on pravastatin 20 mg daily.

## 2022-06-26 NOTE — Assessment & Plan Note (Signed)
Continue current dose of vitamin D supplementation. Further recommendation will be given according to 25 OH vitamin D result. 

## 2022-06-26 NOTE — Assessment & Plan Note (Addendum)
Continue pravastatin 20 mg daily and low-fat diet. Further recommendation will be given according to lipid panel result. 

## 2022-06-26 NOTE — Assessment & Plan Note (Signed)
BP adequately controlled. Continue amlodipine 10 mg daily and losartan 50 mg daily as well as low-salt diet. Continue monitoring BP regularly. Eye exam is current.

## 2022-06-26 NOTE — Patient Instructions (Addendum)
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  If you need refills for medications you take chronically, please call your pharmacy. Do not use My Chart to request refills or for acute issues that need immediate attention. If you send a my chart message, it may take a few days to be addressed, specially if I am not in the office.  Please be sure medication list is accurate. If a new problem present, please set up appointment sooner than planned today.

## 2022-06-26 NOTE — Assessment & Plan Note (Addendum)
DEXA 05/24/22 showed some areas with worsening problem (L2-L4 -3.0 and left femur -2.5). She did try Fosamax and Actonel. She has decided to resume Prolia, injection given today. Continue adequate calcium and vitamin D intake. Continue regular physical activity that includes weightbearing exercise. Fall prevention. BMP and 25 OH vitamin D ordered today.

## 2022-06-27 LAB — VITAMIN D 25 HYDROXY (VIT D DEFICIENCY, FRACTURES): VITD: 45.9 ng/mL (ref 30.00–100.00)

## 2022-07-05 ENCOUNTER — Encounter: Payer: Self-pay | Admitting: Obstetrics and Gynecology

## 2022-07-05 ENCOUNTER — Ambulatory Visit (INDEPENDENT_AMBULATORY_CARE_PROVIDER_SITE_OTHER): Payer: PPO | Admitting: Obstetrics and Gynecology

## 2022-07-05 VITALS — BP 148/79 | HR 85 | Ht 61.5 in | Wt 121.0 lb

## 2022-07-05 DIAGNOSIS — R350 Frequency of micturition: Secondary | ICD-10-CM

## 2022-07-05 DIAGNOSIS — R3915 Urgency of urination: Secondary | ICD-10-CM

## 2022-07-05 DIAGNOSIS — N8110 Cystocele, unspecified: Secondary | ICD-10-CM

## 2022-07-05 DIAGNOSIS — N812 Incomplete uterovaginal prolapse: Secondary | ICD-10-CM | POA: Diagnosis not present

## 2022-07-05 LAB — POCT URINALYSIS DIPSTICK
Bilirubin, UA: NEGATIVE
Blood, UA: NEGATIVE
Glucose, UA: NEGATIVE
Ketones, UA: NEGATIVE
Nitrite, UA: NEGATIVE
Protein, UA: NEGATIVE
Spec Grav, UA: 1.025 (ref 1.010–1.025)
Urobilinogen, UA: 0.2 E.U./dL
pH, UA: 5.5 (ref 5.0–8.0)

## 2022-07-05 NOTE — Patient Instructions (Signed)
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  We discussed the fact that it is not life threatening but there are several treatment options. For treatment of pelvic organ prolapse, we discussed options for management including expectant management, conservative management, and surgical management, such as Kegels, a pessary, pelvic floor physical therapy, and specific surgical procedures.    

## 2022-07-05 NOTE — Progress Notes (Signed)
[facility] New Patient Evaluation and Consultation  Referring Provider: Bjorn Loser, MD PCP: Martinique, Betty G, MD Date of Service: 07/05/2022  SUBJECTIVE Chief Complaint: New Patient (Initial Visit)  History of Present Illness: Molly Wilcox is a 82 y.o. White or Caucasian female seen in consultation at the request of Dr. Matilde Sprang for evaluation of prolapse.    Review of records significant for: Cystocele noted on pelvic exam. Started on Myrbetriq for urgency.   Urinary Symptoms: Leaks urine with with urgency- rarely Denies leakage with SUI. Pad use: none She is not bothered by her UI symptoms. Had been on oxybutynin 10 years ago. Has been on Myrbetriq 50mg  for about 2 years, works well.   Day time voids- every few hours.  Nocturia: 2-3 times per night to void. Voiding dysfunction: she empties her bladder well.  does not use a catheter to empty bladder.  When urinating, she feels a weak stream Drinks: 2-3 cups coffee, 2 large bottles water per day  UTIs: 1 UTI's in the last year.   Denies history of blood in urine and kidney or bladder stones  Pelvic Organ Prolapse Symptoms:                  She Admits to a feeling of a bulge the vaginal area. It has been present for a few years but has been worsening.  She Admits to seeing a bulge.  This bulge is bothersome.  Bowel Symptom: Bowel movements: 1 time(s) per day Stool consistency: soft  Straining: no.  Splinting: no.  Incomplete evacuation: no.  She Denies accidental bowel leakage / fecal incontinence Bowel regimen: miralax   Sexual Function Sexually active: no.   Pelvic Pain Denies pelvic pain    Past Medical History:  Past Medical History:  Diagnosis Date   High cholesterol    Hypertension    Osteoarthritis    Raynaud disease      Past Surgical History:   Past Surgical History:  Procedure Laterality Date   TUBAL LIGATION       Past OB/GYN History: OB History  Gravida  Para Term Preterm AB Living  2 2 2     2   SAB IAB Ectopic Multiple Live Births          2    # Outcome Date GA Lbr Len/2nd Weight Sex Delivery Anes PTL Lv  2 Term      Vag-Spont     1 Term      Vag-Spont      Menopausal: Denies vaginal bleeding since menopause Any history of abnormal pap smears: no.   Medications: She has a current medication list which includes the following prescription(s): amlodipine, vitamin c, aspirin ec, calcium carbonate antacid, calcium gummies, losartan, magnesium oxide, myrbetriq, polyethylene glycol 3350, and pravastatin.   Allergies: Patient is allergic to prednisone, ciprofloxacin, lisinopril-hydrochlorothiazide, and ibuprofen.   Social History:  Social History   Tobacco Use   Smoking status: Never   Smokeless tobacco: Never  Vaping Use   Vaping Use: Never used  Substance Use Topics   Alcohol use: Yes    Comment: social   Drug use: No    Relationship status: widowed She lives alone.   She is not employed. Regular exercise: Yes:   History of abuse: No  Family History:   Family History  Problem Relation Age of Onset   Heart disease Mother    Hypertension Father    Diabetes Father    Thyroid disease Neg Hx  Review of Systems: Review of Systems  Constitutional:  Negative for fever, malaise/fatigue and weight loss.  Respiratory:  Negative for cough, shortness of breath and wheezing.   Cardiovascular:  Negative for chest pain, palpitations and leg swelling.  Gastrointestinal:  Negative for abdominal pain and blood in stool.  Genitourinary:  Negative for dysuria.  Musculoskeletal:  Negative for myalgias.  Skin:  Negative for rash.  Neurological:  Negative for dizziness and headaches.  Endo/Heme/Allergies:  Does not bruise/bleed easily.  Psychiatric/Behavioral:  Negative for depression. The patient is not nervous/anxious.      OBJECTIVE Physical Exam: Vitals:   07/05/22 1456  BP: (!) 148/79  Pulse: 85  Weight: 121 lb (54.9  kg)  Height: 5' 1.5" (1.562 m)    Physical Exam Constitutional:      General: She is not in acute distress. Pulmonary:     Effort: Pulmonary effort is normal.  Abdominal:     General: There is no distension.     Palpations: Abdomen is soft.     Tenderness: There is no abdominal tenderness. There is no rebound.  Musculoskeletal:        General: No swelling. Normal range of motion.  Skin:    General: Skin is warm and dry.     Findings: No rash.  Neurological:     Mental Status: She is alert and oriented to person, place, and time.  Psychiatric:        Mood and Affect: Mood normal.        Behavior: Behavior normal.     GU / Detailed Urogynecologic Evaluation:  Pelvic Exam: Normal external female genitalia; Bartholin's and Skene's glands normal in appearance; urethral meatus normal in appearance, no urethral masses or discharge.   CST: negative  Speculum exam reveals normal vaginal mucosa with atrophy. Cervix normal appearance. Uterus normal single, nontender. Adnexa no mass, fullness, tenderness.    Pelvic floor strength 0/V  Pelvic floor musculature: Right levator non-tender, Right obturator non-tender, Left levator non-tender, Left obturator non-tender  POP-Q:   POP-Q  0.5                                            Aa   0.5                                           Ba  -5                                              C   3                                            Gh  3                                            Pb  6.5  tvl   -2                                            Ap  -2                                            Bp  -5.5                                              D      Rectal Exam:  Normal external rectum  Post-Void Residual (PVR) by Bladder Scan: In order to evaluate bladder emptying, we discussed obtaining a postvoid residual and she agreed to this procedure.  Procedure: The ultrasound unit  was placed on the patient's abdomen in the suprapubic region after the patient had voided. A PVR of 92 ml was obtained by bladder scan.  Laboratory Results: POC urine: trace leukocytes, otherwise negative   ASSESSMENT AND PLAN Ms. Kamber is an 82 y.o. with:  1. Prolapse of anterior vaginal wall   2. Uterovaginal prolapse, incomplete   3. Urinary frequency   4. Urinary urgency    Stage II anterior, Stage I posterior, Stage I apical prolapse - For treatment of pelvic organ prolapse, we discussed options for management including expectant management, conservative management, and surgical management, such as Kegels, a pessary, pelvic floor physical therapy, and specific surgical procedures. - She is interested in a pessary, will return for a fitting.   2. Urgency/ frequency - currently on Myrbetriq 50mg  and it is working well for her, will continue  Return for pessary    Jaquita Folds, MD

## 2022-07-12 ENCOUNTER — Ambulatory Visit: Payer: PPO | Admitting: Family Medicine

## 2022-07-21 NOTE — Progress Notes (Addendum)
[facility]   Subjective:     Chief Complaint: Pessary Fitting (Molly Wilcox is a 82 y.o. female is for pessary fitting.)  History of Present Illness: Molly Wilcox is a 82 y.o. female with stage II pelvic organ prolapse who presents today for a pessary fitting.    Past Medical History: Patient  has a past medical history of High cholesterol, Hypertension, Osteoarthritis, and Raynaud disease.   Past Surgical History: She  has a past surgical history that includes Tubal ligation.   Medications: She has a current medication list which includes the following prescription(s): amlodipine, vitamin c, aspirin ec, calcium carbonate antacid, calcium gummies, denosumab, losartan, magnesium oxide, myrbetriq, polyethylene glycol 3350, and pravastatin.   Allergies: Patient is allergic to prednisone, ciprofloxacin, lisinopril-hydrochlorothiazide, and ibuprofen.   Social History: Patient  reports that she has never smoked. She has never used smokeless tobacco. She reports current alcohol use. She reports that she does not use drugs.      Objective:    BP (!) 146/74   Pulse 78  Gen: No apparent distress, A&O x 3. Pelvic Exam: Normal external female genitalia; Bartholin's and Skene's glands normal in appearance; urethral meatus with caruncle, no urethral masses or discharge.   Attempted a size #1 ring   A size #1 ring pessary was fitted. It was comfortable, stayed in place with valsalva and was an appropriate size on examination, with one finger fitting between the pessary and the vaginal walls.Patient was able to urinate and attempt a BM without pessary expulsion.  POP-Q   0.5                                            Aa   0.5                                           Ba   -5                                              C    3                                            Gh   3                                            Pb   6.5                                             tvl    -2                                            Ap   -  2                                            Bp   -5.5                                              D     Assessment/Plan:    Assessment: Ms. Barnfield is a 82 y.o. with stage II pelvic organ prolapse who presents for a pessary fitting. Plan: She was fitted with a #1 ring pessary. She will keep the pessary in place until next visit. She will use estrogen.   Follow-up in 4 weeks for a pessary check or sooner as needed.  All questions were answered.    Selmer Dominion, NP

## 2022-07-24 ENCOUNTER — Ambulatory Visit (INDEPENDENT_AMBULATORY_CARE_PROVIDER_SITE_OTHER): Payer: PPO | Admitting: Obstetrics and Gynecology

## 2022-07-24 ENCOUNTER — Encounter: Payer: Self-pay | Admitting: Obstetrics and Gynecology

## 2022-07-24 VITALS — BP 146/74 | HR 78

## 2022-07-24 DIAGNOSIS — N812 Incomplete uterovaginal prolapse: Secondary | ICD-10-CM | POA: Diagnosis not present

## 2022-07-24 DIAGNOSIS — N952 Postmenopausal atrophic vaginitis: Secondary | ICD-10-CM

## 2022-07-24 MED ORDER — ESTRADIOL 10 MCG VA TABS
1.0000 | ORAL_TABLET | VAGINAL | 3 refills | Status: DC
Start: 2022-07-24 — End: 2022-08-21

## 2022-07-24 NOTE — Patient Instructions (Signed)
Please use either the premarin or Yuvafem for the vaginal atrophy.

## 2022-07-25 ENCOUNTER — Telehealth: Payer: Self-pay | Admitting: Obstetrics and Gynecology

## 2022-07-25 NOTE — Telephone Encounter (Signed)
LVM for patient and if she wants to come back in for a pessary change she should schedule

## 2022-07-25 NOTE — Telephone Encounter (Signed)
Pt called this morning c/o pessary not working.  Says the prolapse is coming out around it. Also, medication prescribed at her visit was $200.  Would like an alternative.  She was advised she would have a call back to discuss.

## 2022-07-28 ENCOUNTER — Ambulatory Visit (INDEPENDENT_AMBULATORY_CARE_PROVIDER_SITE_OTHER): Payer: PPO | Admitting: Obstetrics and Gynecology

## 2022-07-28 ENCOUNTER — Encounter: Payer: Self-pay | Admitting: Obstetrics and Gynecology

## 2022-07-28 VITALS — BP 147/77 | HR 77

## 2022-07-28 DIAGNOSIS — N8110 Cystocele, unspecified: Secondary | ICD-10-CM

## 2022-07-28 DIAGNOSIS — N812 Incomplete uterovaginal prolapse: Secondary | ICD-10-CM | POA: Diagnosis not present

## 2022-07-28 NOTE — Progress Notes (Signed)
[facility]   Subjective:     Chief Complaint:  Chief Complaint  Patient presents with   Pessary issues    Molly Wilcox is a 82 y.o. female is here due to pessary issues.   History of Present Illness: Molly Wilcox is a 82 y.o. female with stage II pelvic organ prolapse who presents for a pessary check. She is using a size #1 ring pessary. The pessary has not been working well for her and she has had some prolapse coming around the pessary.   Past Medical History: Patient  has a past medical history of High cholesterol, Hypertension, Osteoarthritis, and Raynaud disease.   Past Surgical History: She  has a past surgical history that includes Tubal ligation.   Medications: She has a current medication list which includes the following prescription(s): amlodipine, vitamin c, aspirin ec, calcium carbonate antacid, calcium gummies, denosumab, estradiol, losartan, magnesium oxide, myrbetriq, polyethylene glycol 3350, and pravastatin.   Allergies: Patient is allergic to prednisone, ciprofloxacin, lisinopril-hydrochlorothiazide, and ibuprofen.   Social History: Patient  reports that she has never smoked. She has never used smokeless tobacco. She reports current alcohol use. She reports that she does not use drugs.      Objective:    Physical Exam: BP (!) 147/77   Pulse 77  Gen: No apparent distress, A&O x 3. Detailed Urogynecologic Evaluation:  Pelvic Exam: Normal external female genitalia; Bartholin's and Skene's glands normal in appearance; urethral meatus with caruncle, no urethral masses or discharge. The pessary was noted to be in place. It was removed and cleaned. The pessary was changed to a #2 dish (Lot [id number]). This was comfortable to the patient and fit well. She was able to urinate and simulate a bowel movement without pessary expulsion.   Assessment/Plan:    Assessment: Molly Wilcox is a 82 y.o. with stage II pelvic organ prolapse here for a  pessary check. She is doing well.  Plan: She will keep the pessary in place until next visit. She will continue to use estrogen twice weekly. She will follow-up in 3 weeks for a pessary check or sooner as needed.   All questions were answered.

## 2022-07-28 NOTE — Patient Instructions (Signed)
Please use the estrogen cream x2 weekly and rub it over the urethra as well where the caruncle is.   We can try having you take it in and out at the next visit if you would like.   Please call or message with any issues.

## 2022-08-20 NOTE — Progress Notes (Unsigned)
[facility]   Subjective:     Chief Complaint: No chief complaint on file.  History of Present Illness: Angee Caleigha Brautigam is a 82 y.o. female with stage II pelvic organ prolapse who presents for a pessary check. She is using a size #2 incontinence dish pessary. The pessary has been working well and she has no complaints. She {ACTION; IS/IS ZOX:09604540} using vaginal estrogen. She denies vaginal bleeding.  Past Medical History: Patient  has a past medical history of High cholesterol, Hypertension, Osteoarthritis, and Raynaud disease.   Past Surgical History: She  has a past surgical history that includes Tubal ligation.   Medications: She has a current medication list which includes the following prescription(s): amlodipine, vitamin c, aspirin ec, calcium carbonate antacid, calcium gummies, denosumab, estradiol, losartan, magnesium oxide, myrbetriq, polyethylene glycol 3350, and pravastatin.   Allergies: Patient is allergic to prednisone, ciprofloxacin, lisinopril-hydrochlorothiazide, and ibuprofen.   Social History: Patient  reports that she has never smoked. She has never used smokeless tobacco. She reports current alcohol use. She reports that she does not use drugs.      Objective:    Physical Exam: There were no vitals taken for this visit. Gen: No apparent distress, A&O x 3. Detailed Urogynecologic Evaluation:  Pelvic Exam: Normal external female genitalia; Bartholin's and Skene's glands normal in appearance; urethral meatus {urethra:24773}, no urethral masses or discharge. The pessary was noted to be {in place:24774}. It was removed and cleaned. Speculum exam revealed {vaginal lesions:24775} in the vagina. The pessary was replaced. It was comfortable to the patient and fit well.       No data to display          Laboratory Results: Urine dipstick shows: {ua dip:315374::"negative for all components"}.    Assessment/Plan:    Assessment: Ms.  Townsend is a 82 y.o. with stage II pelvic organ prolapse here for a pessary check. She is doing well.  Plan: She will {pessary plan:24776}. She will continue to use {lubricant:24777}. She will follow-up in *** {days/wks/mos/yrs:310907} for a pessary check or sooner as needed.  All questions were answered.   Time Spent:

## 2022-08-21 ENCOUNTER — Ambulatory Visit (INDEPENDENT_AMBULATORY_CARE_PROVIDER_SITE_OTHER): Payer: PPO | Admitting: Obstetrics and Gynecology

## 2022-08-21 ENCOUNTER — Encounter: Payer: Self-pay | Admitting: Obstetrics and Gynecology

## 2022-08-21 ENCOUNTER — Other Ambulatory Visit: Payer: Self-pay | Admitting: Family Medicine

## 2022-08-21 VITALS — BP 134/70 | HR 83

## 2022-08-21 DIAGNOSIS — I10 Essential (primary) hypertension: Secondary | ICD-10-CM

## 2022-08-21 DIAGNOSIS — N952 Postmenopausal atrophic vaginitis: Secondary | ICD-10-CM | POA: Diagnosis not present

## 2022-08-21 MED ORDER — ESTRADIOL 0.1 MG/GM VA CREA
0.5000 g | TOPICAL_CREAM | VAGINAL | 11 refills | Status: AC
Start: 2022-08-21 — End: ?

## 2022-08-21 MED ORDER — ESTRADIOL 7.5 MCG/24HR VA RING
1.0000 | VAGINAL_RING | VAGINAL | 3 refills | Status: DC
Start: 2022-08-21 — End: 2022-08-21

## 2022-09-21 DIAGNOSIS — H25813 Combined forms of age-related cataract, bilateral: Secondary | ICD-10-CM | POA: Diagnosis not present

## 2022-09-21 DIAGNOSIS — H18061 Stromal corneal pigmentations, right eye: Secondary | ICD-10-CM | POA: Diagnosis not present

## 2022-11-16 ENCOUNTER — Encounter (INDEPENDENT_AMBULATORY_CARE_PROVIDER_SITE_OTHER): Payer: Self-pay

## 2022-12-05 ENCOUNTER — Encounter: Payer: Self-pay | Admitting: Family Medicine

## 2022-12-15 DIAGNOSIS — H2511 Age-related nuclear cataract, right eye: Secondary | ICD-10-CM | POA: Diagnosis not present

## 2022-12-15 DIAGNOSIS — H25043 Posterior subcapsular polar age-related cataract, bilateral: Secondary | ICD-10-CM | POA: Diagnosis not present

## 2022-12-15 DIAGNOSIS — H2513 Age-related nuclear cataract, bilateral: Secondary | ICD-10-CM | POA: Diagnosis not present

## 2022-12-15 DIAGNOSIS — I10 Essential (primary) hypertension: Secondary | ICD-10-CM | POA: Diagnosis not present

## 2022-12-15 DIAGNOSIS — H25013 Cortical age-related cataract, bilateral: Secondary | ICD-10-CM | POA: Diagnosis not present

## 2022-12-18 ENCOUNTER — Encounter: Payer: Self-pay | Admitting: Obstetrics and Gynecology

## 2022-12-18 ENCOUNTER — Ambulatory Visit: Payer: PPO | Admitting: Obstetrics and Gynecology

## 2022-12-18 VITALS — BP 132/73 | HR 93 | Wt 121.4 lb

## 2022-12-18 DIAGNOSIS — N8110 Cystocele, unspecified: Secondary | ICD-10-CM

## 2022-12-18 DIAGNOSIS — N952 Postmenopausal atrophic vaginitis: Secondary | ICD-10-CM | POA: Diagnosis not present

## 2022-12-18 DIAGNOSIS — N368 Other specified disorders of urethra: Secondary | ICD-10-CM

## 2022-12-18 DIAGNOSIS — N812 Incomplete uterovaginal prolapse: Secondary | ICD-10-CM

## 2022-12-18 NOTE — Progress Notes (Signed)
[facility]   Subjective:     Chief Complaint:  Chief Complaint  Patient presents with   Pessary Check    inhaled corticosteroids here for pessary check.   History of Present Illness: Molly Wilcox is a 82 y.o. female with stage II pelvic organ prolapse who presents for a pessary check. She is using a size #2 incontinence dish pessary. The pessary has been working well and she has no complaints. She is using vaginal estrogen. She denies vaginal bleeding.  Past Medical History: Patient  has a past medical history of High cholesterol, Hypertension, Osteoarthritis, and Raynaud disease.   Past Surgical History: She  has a past surgical history that includes Tubal ligation.   Medications: She has a current medication list which includes the following prescription(s): amlodipine, vitamin c, aspirin ec, calcium carbonate antacid, calcium gummies, denosumab, estradiol, losartan, magnesium oxide, myrbetriq, polyethylene glycol 3350, and pravastatin.   Allergies: Patient is allergic to prednisone, ciprofloxacin, lisinopril-hydrochlorothiazide, and ibuprofen.   Social History: Patient  reports that she has never smoked. She has never used smokeless tobacco. She reports current alcohol use. She reports that she does not use drugs.      Objective:    Physical Exam: BP 132/73   Pulse 93   Wt 121 lb 6.4 oz (55.1 kg)   BMI 22.57 kg/m  Gen: No apparent distress, A&O x 3. Detailed Urogynecologic Evaluation:  Pelvic Exam: Normal external female genitalia; Bartholin's and Skene's glands normal in appearance; urethral meatus prolapsed, no urethral masses or discharge. The pessary was noted to be in place. It was removed and cleaned. Speculum exam revealed no lesions in the vagina. The pessary was replaced. It was comfortable to the patient and fit well.   Assessment/Plan:    Assessment: Molly Wilcox is an 82 y.o. with stage II pelvic organ prolapse here for a pessary check.  She is doing well.  Plan: She will keep the pessary in place until next visit. She will continue to use estrogen. She will follow-up in 3 months for a pessary check or sooner as needed.  All questions were answered.

## 2022-12-18 NOTE — Patient Instructions (Signed)
Good to see you!  Use the estrogen cream every other night for now to help the urethral prolapse

## 2022-12-19 DIAGNOSIS — H25043 Posterior subcapsular polar age-related cataract, bilateral: Secondary | ICD-10-CM | POA: Diagnosis not present

## 2022-12-19 DIAGNOSIS — H25013 Cortical age-related cataract, bilateral: Secondary | ICD-10-CM | POA: Diagnosis not present

## 2022-12-19 DIAGNOSIS — H2511 Age-related nuclear cataract, right eye: Secondary | ICD-10-CM | POA: Diagnosis not present

## 2022-12-19 DIAGNOSIS — I10 Essential (primary) hypertension: Secondary | ICD-10-CM | POA: Diagnosis not present

## 2022-12-19 DIAGNOSIS — H2513 Age-related nuclear cataract, bilateral: Secondary | ICD-10-CM | POA: Diagnosis not present

## 2022-12-21 ENCOUNTER — Encounter: Payer: Self-pay | Admitting: Family Medicine

## 2022-12-27 ENCOUNTER — Ambulatory Visit: Payer: PPO | Admitting: Family Medicine

## 2022-12-27 ENCOUNTER — Other Ambulatory Visit (INDEPENDENT_AMBULATORY_CARE_PROVIDER_SITE_OTHER): Payer: PPO

## 2022-12-27 ENCOUNTER — Encounter: Payer: Self-pay | Admitting: Family Medicine

## 2022-12-27 VITALS — BP 120/72 | HR 76 | Temp 97.9°F | Resp 16 | Ht 61.5 in | Wt 121.2 lb

## 2022-12-27 DIAGNOSIS — E119 Type 2 diabetes mellitus without complications: Secondary | ICD-10-CM

## 2022-12-27 DIAGNOSIS — R7303 Prediabetes: Secondary | ICD-10-CM

## 2022-12-27 DIAGNOSIS — M816 Localized osteoporosis [Lequesne]: Secondary | ICD-10-CM

## 2022-12-27 DIAGNOSIS — I10 Essential (primary) hypertension: Secondary | ICD-10-CM | POA: Diagnosis not present

## 2022-12-27 LAB — BASIC METABOLIC PANEL
BUN: 24 mg/dL — ABNORMAL HIGH (ref 6–23)
CO2: 28 mEq/L (ref 19–32)
Calcium: 9.3 mg/dL (ref 8.4–10.5)
Chloride: 105 mEq/L (ref 96–112)
Creatinine, Ser: 0.86 mg/dL (ref 0.40–1.20)
GFR: 62.66 mL/min (ref >60.00)
Glucose, Bld: 97 mg/dL (ref 70–99)
Potassium: 4 mEq/L (ref 3.5–5.1)
Sodium: 141 mEq/L (ref 135–145)

## 2022-12-27 LAB — HEMOGLOBIN A1C: Hgb A1c MFr Bld: 5.6 % (ref 4.6–6.5)

## 2022-12-27 MED ORDER — DENOSUMAB 60 MG/ML ~~LOC~~ SOSY
60.0000 mg | PREFILLED_SYRINGE | Freq: Once | SUBCUTANEOUS | Status: AC
Start: 2022-12-27 — End: 2022-12-27
  Administered 2022-12-27: 60 mg via SUBCUTANEOUS

## 2022-12-27 NOTE — Progress Notes (Unsigned)
HPI: [name] is a 82 y.o. female with a PMHx significant for HTN, HLD, prediabetes, vitamin D deficiency, and OA who is here today for chronic disease management.  Last seen on 06/26/2022.  She has seen gynecology since last visit and was given a pessary. She sees her gynecologist every 3-4 months.   Exercise: She reports she is still walking.  Diet: She is still cooking at home.  Vision: She is UTD on routine vision care.  Hypertension: She is currently taking amlodipine 10 mg daily and losartan 50 mg daily.  BP readings at home: She has been checking at home. Systolic readings have been in the 120s-130s with occasional instances in the 140s.   Side effects: none She reports she will have cataract surgery in 02/2023.   Negative for unusual or severe headache, exertional chest pain, dyspnea,  focal weakness, or unusual edema.  Lab Results  Component Value Date   CREATININE 0.78 06/26/2022   BUN 26 (H) 06/26/2022   NA 139 06/26/2022   K 4.1 06/26/2022   CL 101 06/26/2022   CO2 27 06/26/2022   Prediabetes: Negative for polyuria, polydipsia, polyphagia. Lab Results  Component Value Date   HGBA1C 5.3 03/13/2022   Planning on traveling to [place] and [place] in 3 days with her daughter, she will stay overseas for 11 days.  Review of Systems  Constitutional:  Negative for chills and fever.  HENT:  Negative for nosebleeds and sore throat.   Respiratory:  Negative for cough and wheezing.   Gastrointestinal:  Negative for abdominal pain, nausea and vomiting.  Genitourinary:  Negative for decreased urine volume, dysuria and hematuria.  Skin:  Negative for rash.  Neurological:  Negative for syncope and facial asymmetry.  See other pertinent positives and negatives in HPI.  Current Outpatient Medications on File Prior to Visit  Medication Sig Dispense Refill   amLODipine (NORVASC) 10 MG tablet TAKE 1 TABLET BY MOUTH EVERY DAY 90 tablet 3   Ascorbic Acid (VITAMIN C)  250 MG CHEW Chew 250 mg by mouth daily.     aspirin EC 81 MG tablet Take 81 mg by mouth daily.     Calcium Carbonate Antacid (TUMS PO) Take 2 tablets by mouth daily as needed (heartburn).     Calcium-Phosphorus-Vitamin D (CALCIUM GUMMIES) 250-100-500 MG-MG-UNIT CHEW Chew 2 tablets by mouth daily.     denosumab (PROLIA) 60 MG/ML SOSY injection Inject 60 mg into the skin every 6 (six) months.     estradiol (ESTRACE) 0.1 MG/GM vaginal cream Place 0.5 g vaginally 2 (two) times a week. Place 0.5g nightly for two weeks then twice a week after 42.5 g 11   losartan (COZAAR) 50 MG tablet TAKE 1 TABLET BY MOUTH EVERY DAY 90 tablet 2   MAGNESIUM OXIDE PO Take 0.5 tablets by mouth daily.     MYRBETRIQ 50 MG TB24 tablet Take 50 mg by mouth daily.     Polyethylene Glycol 3350 (MIRALAX PO) Take 17 g by mouth as needed (constipation).     pravastatin (PRAVACHOL) 20 MG tablet TAKE 1 TABLET BY MOUTH EVERY DAY 90 tablet 1   No current facility-administered medications on file prior to visit.    Past Medical History:  Diagnosis Date   High cholesterol    Hypertension    Osteoarthritis    Raynaud disease    Allergies  Allergen Reactions   Prednisone Other (See Comments)    Dehydration, amnesia    Ciprofloxacin Other (See Comments)  GI issues   Lisinopril-Hydrochlorothiazide Cough   Ibuprofen Nausea Only and Other (See Comments)    Nausea & headaches    Social History   Socioeconomic History   Marital status: Widowed    Spouse name: Not on file   Number of children: Not on file   Years of education: Not on file   Highest education level: Bachelor's degree (e.g., BA, AB, BS)  Occupational History   Not on file  Tobacco Use   Smoking status: Never   Smokeless tobacco: Never  Vaping Use   Vaping status: Never Used  Substance and Sexual Activity   Alcohol use: Yes    Comment: social   Drug use: No   Sexual activity: Not Currently  Other Topics Concern   Not on file  Social History  Narrative   Not on file   Social Determinants of Health   Financial Resource Strain: Low Risk  (06/16/2021)   Overall Financial Resource Strain (CARDIA)    Difficulty of Paying Living Expenses: Not hard at all  Food Insecurity: Unknown (06/22/2022)   Hunger Vital Sign    Worried About Running Out of Food in the Last Year: Never true    Ran Out of Food in the Last Year: Not on file  Transportation Needs: No Transportation Needs (06/22/2022)   PRAPARE - Administrator, Civil Service (Medical): No    Lack of Transportation (Non-Medical): No  Physical Activity: Sufficiently Active (06/22/2022)   Exercise Vital Sign    Days of Exercise per Week: 7 days    Minutes of Exercise per Session: 90 min  Stress: No Stress Concern Present (06/22/2022)   [employer] of Occupational Health - Occupational Stress Questionnaire    Feeling of Stress : Not at all  Social Connections: Unknown (06/22/2022)   Social Connection and Isolation Panel [NHANES]    Frequency of Communication with Friends and Family: More than three times a week    Frequency of Social Gatherings with Friends and Family: Once a week    Attends Religious Services: Never    Database administrator or Organizations: Not on file    Attends Banker Meetings: Not on file    Marital Status: Widowed    Vitals:   12/27/22 0837  BP: 120/72  Pulse: 76  Resp: 16  Temp: 97.9 F (36.6 C)  SpO2: 99%   Body mass index is 22.54 kg/m.  Physical Exam Vitals and nursing note reviewed.  Constitutional:      General: She is not in acute distress.    Appearance: She is well-developed.  HENT:     Head: Normocephalic and atraumatic.     Mouth/Throat:     Mouth: Mucous membranes are moist.     Pharynx: Oropharynx is clear.  Eyes:     Conjunctiva/sclera: Conjunctivae normal.  Cardiovascular:     Rate and Rhythm: Normal rate and regular rhythm.     Pulses:          Dorsalis pedis pulses are 2+ on the right side  and 2+ on the left side.     Heart sounds: No murmur heard. Pulmonary:     Effort: Pulmonary effort is normal. No respiratory distress.     Breath sounds: Normal breath sounds.  Abdominal:     Palpations: Abdomen is soft. There is no hepatomegaly or mass.     Tenderness: There is no abdominal tenderness.  Musculoskeletal:     Right lower leg: 1+ Edema  present.     Left lower leg: 1+ Edema present.  Lymphadenopathy:     Cervical: No cervical adenopathy.  Skin:    General: Skin is warm.     Findings: No erythema or rash.  Neurological:     General: No focal deficit present.     Mental Status: She is alert and oriented to person, place, and time.     Cranial Nerves: No cranial nerve deficit.     Gait: Gait normal.  Psychiatric:        Mood and Affect: Mood and affect normal.   ASSESSMENT AND PLAN:  Ms. Walp was seen today for chronic disease follow up.    Orders Placed This Encounter  Procedures   Hemoglobin A1c   Basic metabolic panel  *** Prediabetes Assessment & Plan: HgA1C 6.5 in 02/2022 after hospitalization, rest of HgA1C 5.3-6.2. Continue a healthy lifestyle for diabetes prevention.  Orders: -     Hemoglobin A1c; Future  Essential hypertension Assessment & Plan: BP adequately controlled. Most home BP readings 120-130's/70's.We discussed the appropriate technique, more information on AVS. Continue amlodipine 10 mg daily and losartan 50 mg daily as well as low-salt diet. Continue monitoring BP regularly. Eye exam is current.  Orders: -     Basic metabolic panel; Future  Localized osteoporosis without current pathological fracture Assessment & Plan: DEXA 05/24/22. She did try Fosamax and Actonel. Currently on Prolia, injection given today. Continue adequate calcium and vitamin D intake. Last 25 OH vit D 45 in 06/2022. Fall prevention.  Orders: -     Denosumab   Return in about 6 months (around 06/26/2023) for CPE.  I, Suanne Marker, acting as a  scribe for Courtnie Brenes [place], MD., have documented all relevant documentation on the behalf of Shreyas Piatkowski [place], MD, as directed by  Sophy Mesler [place], MD while in the presence of Torian Thoennes [place], MD.   I, Jahmiya Guidotti [place], MD, have reviewed all documentation for this visit. The documentation on 12/27/22 for the exam, diagnosis, procedures, and orders are all accurate and complete.  Jeryl Wilbourn G. [place], MD  [facility]. [place] [facility].

## 2022-12-27 NOTE — Assessment & Plan Note (Addendum)
BP adequately controlled. Most home BP readings 120-130's/70's.We discussed the appropriate technique, more information on AVS. Continue amlodipine 10 mg daily and losartan 50 mg daily as well as low-salt diet. Continue monitoring BP regularly. Eye exam is current.

## 2022-12-27 NOTE — Assessment & Plan Note (Signed)
HgA1C 6.5 in 02/2022 after hospitalization, rest of HgA1C 5.3-6.2. Continue a healthy lifestyle for diabetes prevention.

## 2022-12-27 NOTE — Assessment & Plan Note (Signed)
DEXA 05/24/22. She did try Fosamax and Actonel. Currently on Prolia, injection given today. Continue adequate calcium and vitamin D intake. Last 25 OH vit D 45 in 06/2022. Fall prevention.

## 2022-12-27 NOTE — Patient Instructions (Addendum)
A few things to remember from today's visit:  Essential hypertension - Plan: Basic metabolic panel  Localized osteoporosis without current pathological fracture - Plan: denosumab (PROLIA) injection 60 mg  Prediabetes - Plan: Hemoglobin A1c  No changes today.  If you need refills for medications you take chronically, please call your pharmacy. Do not use My Chart to request refills or for acute issues that need immediate attention. If you send a my chart message, it may take a few days to be addressed, specially if I am not in the office.  Please be sure medication list is accurate. If a new problem present, please set up appointment sooner than planned today.

## 2023-02-11 ENCOUNTER — Other Ambulatory Visit: Payer: Self-pay | Admitting: Family Medicine

## 2023-02-11 DIAGNOSIS — E7800 Pure hypercholesterolemia, unspecified: Secondary | ICD-10-CM

## 2023-02-15 DIAGNOSIS — H2511 Age-related nuclear cataract, right eye: Secondary | ICD-10-CM | POA: Diagnosis not present

## 2023-02-16 DIAGNOSIS — H2512 Age-related nuclear cataract, left eye: Secondary | ICD-10-CM | POA: Diagnosis not present

## 2023-02-16 DIAGNOSIS — H2511 Age-related nuclear cataract, right eye: Secondary | ICD-10-CM | POA: Diagnosis not present

## 2023-03-08 DIAGNOSIS — H2512 Age-related nuclear cataract, left eye: Secondary | ICD-10-CM | POA: Diagnosis not present

## 2023-03-09 DIAGNOSIS — H2512 Age-related nuclear cataract, left eye: Secondary | ICD-10-CM | POA: Diagnosis not present

## 2023-03-26 DIAGNOSIS — Z1231 Encounter for screening mammogram for malignant neoplasm of breast: Secondary | ICD-10-CM | POA: Diagnosis not present

## 2023-03-26 LAB — HM MAMMOGRAPHY

## 2023-04-17 ENCOUNTER — Encounter: Payer: Self-pay | Admitting: Obstetrics and Gynecology

## 2023-04-17 ENCOUNTER — Ambulatory Visit: Payer: PPO | Admitting: Obstetrics and Gynecology

## 2023-04-17 VITALS — BP 143/80 | HR 91

## 2023-04-17 DIAGNOSIS — N812 Incomplete uterovaginal prolapse: Secondary | ICD-10-CM

## 2023-04-17 DIAGNOSIS — N368 Other specified disorders of urethra: Secondary | ICD-10-CM

## 2023-04-17 DIAGNOSIS — N8110 Cystocele, unspecified: Secondary | ICD-10-CM

## 2023-04-17 NOTE — Patient Instructions (Signed)
 Focus on taking a deep breath and blowing out like you are blowing through a straw for when you feel you have urine left in the bladder.   Use the estrogen cream 2-3 times a week.   We will plan to see you in 3 months for pessary check.

## 2023-04-17 NOTE — Progress Notes (Signed)
 [facility]   Subjective:     Chief Complaint:  Chief Complaint  Patient presents with   Pessary Check    Molly Wilcox is a 83 y.o. female is here for pessary check.   History of Present Illness: Molly Wilcox is a 83 y.o. female with stage II pelvic organ prolapse who presents for a pessary check. She is using a size #2 incontinence dish pessary. The pessary has been working well and she has no complaints. She is using vaginal estrogen. She denies vaginal bleeding but reports  Past Medical History: Patient  has a past medical history of High cholesterol, Hypertension, Osteoarthritis, and Raynaud disease.   Past Surgical History: She  has a past surgical history that includes Tubal ligation.   Medications: She has a current medication list which includes the following prescription(s): amlodipine , vitamin c , aspirin ec, calcium carbonate antacid, calcium gummies, denosumab , estradiol , losartan , magnesium oxide, myrbetriq, polyethylene glycol 3350, and pravastatin .   Allergies: Patient is allergic to prednisone, ciprofloxacin, lisinopril -hydrochlorothiazide , and ibuprofen.   Social History: Patient  reports that she has never smoked. She has never used smokeless tobacco. She reports current alcohol use. She reports that she does not use drugs.      Objective:    Physical Exam: BP (!) 143/80   Pulse 91  Gen: No apparent distress, A&O x 3. Detailed Urogynecologic Evaluation:  Pelvic Exam: Normal external female genitalia; Bartholin's and Skene's glands normal in appearance; urethral meatus prolapsed, no urethral masses or discharge. The pessary was noted to be in place. It was removed and cleaned. Speculum exam revealed no lesions in the vagina. The pessary was replaced. It was comfortable to the patient and fit well.   PVR by straight cath was 50ml immediately after patient voided. We discussed deep breathing techniques and reverse kegels to assist  with urination so she does not push.   Assessment/Plan:    Assessment: Molly Wilcox is a 83 y.o. with stage II pelvic organ prolapse here for a pessary check. She is doing well.  Plan: She will keep the pessary in place until next visit. She will continue to use estrogen. She will follow-up in 3 months for a pessary check or sooner as needed. Encouraged her to use the estrogen cream consistently.  All questions were answered.

## 2023-04-22 ENCOUNTER — Ambulatory Visit (HOSPITAL_COMMUNITY)
Admission: EM | Admit: 2023-04-22 | Discharge: 2023-04-22 | Disposition: A | Discharge status: Home / Self Care | Payer: PPO | Attending: Neurology | Admitting: Neurology

## 2023-04-22 ENCOUNTER — Ambulatory Visit (INDEPENDENT_AMBULATORY_CARE_PROVIDER_SITE_OTHER): Payer: PPO

## 2023-04-22 ENCOUNTER — Encounter (HOSPITAL_COMMUNITY): Payer: Self-pay

## 2023-04-22 DIAGNOSIS — R051 Acute cough: Secondary | ICD-10-CM

## 2023-04-22 DIAGNOSIS — R059 Cough, unspecified: Secondary | ICD-10-CM | POA: Diagnosis not present

## 2023-04-22 MED ORDER — ALBUTEROL SULFATE HFA 108 (90 BASE) MCG/ACT IN AERS
1.0000 | INHALATION_SPRAY | Freq: Four times a day (QID) | RESPIRATORY_TRACT | 0 refills | Status: DC | PRN
Start: 2023-04-22 — End: 2023-06-27

## 2023-04-22 MED ORDER — ALBUTEROL SULFATE (2.5 MG/3ML) 0.083% IN NEBU
INHALATION_SOLUTION | RESPIRATORY_TRACT | Status: AC
  Filled 2023-04-22: qty 3

## 2023-04-22 MED ORDER — PROMETHAZINE-DM 6.25-15 MG/5ML PO SYRP: 2.5000 mL | ORAL_SOLUTION | Freq: Four times a day (QID) | ORAL | 0 refills | Status: DC | PRN

## 2023-04-22 MED ORDER — ALBUTEROL SULFATE (2.5 MG/3ML) 0.083% IN NEBU
2.5000 mg | INHALATION_SOLUTION | Freq: Once | RESPIRATORY_TRACT | Status: AC
  Administered 2023-04-22: 2.5 mg via RESPIRATORY_TRACT

## 2023-04-22 MED ORDER — BENZONATATE 100 MG PO CAPS: 100.0000 mg | ORAL_CAPSULE | Freq: Three times a day (TID) | ORAL | 0 refills | Status: DC

## 2023-04-22 NOTE — ED Provider Notes (Signed)
[facility]    CSN: [id number] Arrival date & time: 04/22/23  1017      History   Chief Complaint Chief Complaint  Patient presents with   Cough    HPI Molly Wilcox is a 83 y.o. female.   Reports a cough that started about 3 days ago after shoveling snow on the driveway. She denies fever, chest tightness, or trouble breathing. She states the cough is non productive and wakes her up at night. Denies history of smoking, asthma, or other respiratory conditions. States this happened last year in February.   The history is provided by the patient.  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   Word finding difficulty 02/14/2022   Change in mental status 02/09/2022   Contact dermatitis due to plant 11/21/2021   Prediabetes 08/05/2019   Constipation 05/28/2017   Multinodular goiter 12/06/2016   Keratosis, seborrheic 09/19/2016   Essential hypertension 10/12/2015   Pure hypercholesterolemia 10/12/2015   Vitamin D deficiency 10/12/2015   Osteoporosis 10/12/2015    Past Surgical History:  Procedure Laterality Date   TUBAL LIGATION      OB History     Gravida  2   Para  2   Term  2   Preterm      AB      Living  2      SAB      IAB      Ectopic      Multiple      Live Births  2            Home Medications    Prior to Admission medications   Medication Sig Start Date End Date Taking? Authorizing Provider  albuterol (VENTOLIN HFA) 108 (90 Base) MCG/ACT inhaler Inhale 1-2 puffs into the  lungs every 6 (six) hours as needed for wheezing or shortness of breath. 04/22/23  Yes Kemuel Buchmann, Ludger Nutting, NP  benzonatate (TESSALON) 100 MG capsule Take 1 capsule (100 mg total) by mouth every 8 (eight) hours. 04/22/23  Yes Elmer Picker, NP  promethazine-dextromethorphan (PROMETHAZINE-DM) 6.25-15 MG/5ML syrup Take 2.5 mLs by mouth 4 (four) times daily as needed for cough. 04/22/23  Yes Dimitri Dsouza, Ludger Nutting, NP  amLODipine (NORVASC) 10 MG tablet TAKE 1 TABLET BY MOUTH EVERY DAY 05/31/22   [place], Betty G, MD  Ascorbic Acid (VITAMIN C) 250 MG CHEW Chew 250 mg by mouth daily. 04/24/16   [provider]  aspirin EC 81 MG tablet Take 81 mg by mouth daily.    [provider]  Calcium Carbonate Antacid (TUMS PO) Take 2 tablets by mouth daily as needed (heartburn).    [provider]  Calcium-Phosphorus-Vitamin D (CALCIUM GUMMIES) 250-100-500 MG-MG-UNIT CHEW Chew 2 tablets by mouth daily. 05/05/21   [provider]  denosumab (PROLIA) 60 MG/ML SOSY injection Inject 60 mg into the skin every 6 (six) months.    [provider]  estradiol (ESTRACE) 0.1 MG/GM vaginal cream Place 0.5 g vaginally 2 (two) times a week. Place 0.5g nightly for  two weeks then twice a week after 08/21/22   Selmer Dominion, NP  losartan (COZAAR) 50 MG tablet TAKE 1 TABLET BY MOUTH EVERY DAY 08/21/22   [place], Betty G, MD  MAGNESIUM OXIDE PO Take 0.5 tablets by mouth daily.    [provider]  MYRBETRIQ 50 MG TB24 tablet Take 50 mg by mouth daily. 08/25/21   [provider]  Polyethylene Glycol 3350 (MIRALAX PO) Take 17 g by mouth as needed (constipation).    [provider]  pravastatin (PRAVACHOL) 20 MG tablet TAKE 1 TABLET BY MOUTH EVERY DAY 02/12/23   [place], Betty G, MD    Family History Family History  Problem Relation Age of Onset   Heart disease Mother    Hypertension Father    Diabetes Father    Thyroid disease Neg Hx     Social History Social History   Tobacco  Use   Smoking status: Never   Smokeless tobacco: Never  Vaping Use   Vaping status: Never Used  Substance Use Topics   Alcohol use: Yes    Comment: social   Drug use: No     Allergies   Prednisone, Ciprofloxacin, Lisinopril-hydrochlorothiazide, and Ibuprofen   Review of Systems Review of Systems  Respiratory:  Positive for cough.      Physical Exam Triage Vital Signs ED Triage Vitals  Encounter Vitals Group     BP 04/22/23 1036 (!) 165/79     Systolic BP Percentile --      Diastolic BP Percentile --      Pulse Rate 04/22/23 1036 (!) 103     Resp 04/22/23 1036 14     Temp 04/22/23 1036 98.2 F (36.8 C)     Temp Source 04/22/23 1036 Oral     SpO2 04/22/23 1036 93 %     Weight --      Height --      Head Circumference --      Peak Flow --      Pain Score 04/22/23 1037 0     Pain Loc --      Pain Education --      Exclude from Growth Chart --    No data found.  Updated Vital Signs BP (!) 165/79 (BP Location: Left Arm)   Pulse (!) 103   Temp 98.2 F (36.8 C) (Oral)   Resp 14   SpO2 93%   Visual Acuity Right Eye Distance:   Left Eye Distance:   Bilateral Distance:    Right Eye Near:   Left Eye Near:    Bilateral Near:     Physical Exam Vitals and nursing note reviewed.  Constitutional:      General: She is not in acute distress.    Appearance: Normal appearance. She is well-developed.  HENT:     Head: Normocephalic and atraumatic.     Nose: Nose normal.     Mouth/Throat:     Mouth: Mucous membranes are moist.     Pharynx: Oropharynx is clear.  Eyes:     Extraocular Movements: Extraocular movements intact.     Conjunctiva/sclera: Conjunctivae normal.  Cardiovascular:     Rate and Rhythm: Normal rate and regular rhythm.     Heart sounds: No murmur heard. Pulmonary:     Effort: Pulmonary effort is normal. No respiratory distress.     Breath sounds: Examination of the right-lower field reveals decreased breath sounds. Examination of the  left-lower field reveals decreased breath sounds. Decreased breath sounds  present.  Abdominal:     Palpations: Abdomen is soft.     Tenderness: There is no abdominal tenderness.  Musculoskeletal:        General: No swelling.     Cervical back: Normal range of motion and neck supple.  Skin:    General: Skin is warm and dry.     Capillary Refill: Capillary refill takes less than 2 seconds.  Neurological:     Mental Status: She is alert.  Psychiatric:        Mood and Affect: Mood normal.      UC Treatments / Results  Labs (all labs ordered are listed, but only abnormal results are displayed) Labs Reviewed - No data to display  EKG   Radiology DG Chest 2 View Result Date: 04/22/2023 CLINICAL DATA:  Cough for 3 days. EXAM: CHEST - 2 VIEW COMPARISON:  02/09/2022. FINDINGS: Cardiac silhouette is normal size. No mediastinal or hilar masses. No evidence of adenopathy. Lungs are hyperexpanded, but clear. No pleural effusion or pneumothorax. Skeletal structures are intact. IMPRESSION: No active cardiopulmonary disease. Electronically Signed   By: Amie Portland M.D.   On: 04/22/2023 12:18    Procedures Procedures (including critical care time)  Medications Ordered in UC Medications  albuterol (PROVENTIL) (2.5 MG/3ML) 0.083% nebulizer solution 2.5 mg (2.5 mg Nebulization Given 04/22/23 1214)    Initial Impression / Assessment and Plan / UC Course  I have reviewed the triage vital signs and the nursing notes.  Pertinent labs & imaging results that were available during my care of the patient were reviewed by me and considered in my medical decision making (see chart for details).  Nonproductive cough for approximately 3 days after exertion in the cold.  Her cough has been waking her up at night so I did send her additional cough suppressant and some Tessalon Perles.  Additionally her lungs are hyperexpanded on her imaging so I did send her an inhaler and she did receive a breathing  treatment here at the clinic.  No acute respiratory distress and no symptoms suggesting a viral illness so this testing was deferred.  She will return if she develops any red flag symptoms that discussed.       Final Clinical Impressions(s) / UC Diagnoses   Final diagnoses:  Acute cough     Discharge Instructions      Continue using over the counter medications as you but may additionally use an over the counter cough suppressant and tessalon pearles for cough. Use a cough suppressant at night to help with waking up throughout the night. These have been sent to your pharmacy. If you develop a fever, productive cough, chest tightness, or wheezing please come back to the hospital. Try to stay out of the could until your symptoms resolve as this may be a trigger for you.      ED Prescriptions     Medication Sig Dispense Auth. Provider   promethazine-dextromethorphan (PROMETHAZINE-DM) 6.25-15 MG/5ML syrup Take 2.5 mLs by mouth 4 (four) times daily as needed for cough. 118 mL Elmer Picker, NP   benzonatate (TESSALON) 100 MG capsule Take 1 capsule (100 mg total) by mouth every 8 (eight) hours. 21 capsule Elmer Picker, NP   albuterol (VENTOLIN HFA) 108 (90 Base) MCG/ACT inhaler Inhale 1-2 puffs into the lungs every 6 (six) hours as needed for wheezing or shortness of breath. 8 g Elmer Picker, NP      PDMP not reviewed this encounter.   Elmer Picker, NP 04/22/23  1233  

## 2023-04-22 NOTE — ED Triage Notes (Signed)
Pt reports she has had a cough x 3 days.    Took otc meds but no relief

## 2023-04-22 NOTE — Discharge Instructions (Addendum)
Continue using over the counter medications as you but may additionally use an over the counter cough suppressant and tessalon pearles for cough. Use a cough suppressant at night to help with waking up throughout the night. These have been sent to your pharmacy. If you develop a fever, productive cough, chest tightness, or wheezing please come back to the hospital. Try to stay out of the could until your symptoms resolve as this may be a trigger for you.

## 2023-05-01 DIAGNOSIS — N8111 Cystocele, midline: Secondary | ICD-10-CM | POA: Diagnosis not present

## 2023-05-01 DIAGNOSIS — N3941 Urge incontinence: Secondary | ICD-10-CM | POA: Diagnosis not present

## 2023-05-22 ENCOUNTER — Encounter: Payer: Self-pay | Admitting: Family Medicine

## 2023-05-22 ENCOUNTER — Ambulatory Visit (INDEPENDENT_AMBULATORY_CARE_PROVIDER_SITE_OTHER): Payer: PPO | Admitting: Family Medicine

## 2023-05-22 VITALS — BP 150/79 | Wt 120.0 lb

## 2023-05-22 DIAGNOSIS — Z0000 Encounter for general adult medical examination without abnormal findings: Secondary | ICD-10-CM

## 2023-05-22 NOTE — Patient Instructions (Signed)
 I really enjoyed getting to talk with you today! I am available on Tuesdays and Thursdays for virtual visits if you have any questions or concerns, or if I can be of any further assistance.   CHECKLIST FROM ANNUAL WELLNESS VISIT:  -Follow up (please call to schedule if not scheduled after visit):   -yearly for annual wellness visit with primary care office  Here is a list of your preventive care/health maintenance measures and the plan for each if any are due:  PLAN For any measures below that may be due:   Health Maintenance  Topic Date Due   COVID-19 Vaccine (8 - 2024-25 season) 02/12/2023   Diabetic kidney evaluation - Urine ACR  12/27/2027 (Originally 11/02/2022)   FOOT EXAM  06/26/2023   HEMOGLOBIN A1C  06/26/2023   OPHTHALMOLOGY EXAM  09/13/2023   Diabetic kidney evaluation - eGFR measurement  12/27/2023   Medicare Annual Wellness (AWV)  05/21/2024   DTaP/Tdap/Td (2 - Td or Tdap) 09/28/2026   Pneumonia Vaccine 68+ Years old  Completed   INFLUENZA VACCINE  Completed   DEXA SCAN  Completed   Zoster Vaccines- Shingrix  Completed   HPV VACCINES  Aged Out    -See a dentist at least yearly  -Get your eyes checked and then per your eye specialist's recommendations  -Other issues addressed today:   - monitor blood pressure, goal 120/70 - see dietary recommendations below.   -I have included below further information regarding a healthy whole foods based diet, physical activity guidelines for adults, stress management and opportunities for social connections. I hope you find this information useful.   -----------------------------------------------------------------------------------------------------------------------------------------------------------------------------------------------------------------------------------------------------------    NUTRITION: -eat real food: lots of colorful vegetables (half the plate) and fruits -5-7 servings of vegetables and fruits per  day (fresh or steamed is best), exp. 2 servings of vegetables with lunch and dinner and 2 servings of fruit per day. Berries and greens such as kale and collards are great choices.  -consume on a regular basis:  fresh fruits, fresh veggies, fish, nuts, seeds, healthy oils (such as olive oil, avocado oil), whole grains (make sure first ingredient on label contains the word "whole"). -can eat small amounts of dairy and lean meat (no larger than the palm of your hand), but avoid processed meats such as ham, bacon, lunch meat, etc. -drink water -try to avoid fast food and pre-packaged foods, processed meat, ultra processed foods (donuts, candy, etc.) -most experts advise limiting sodium to < 2300mg  per day, should limit further is any chronic conditions such as high blood pressure, heart disease, diabetes, etc. The American Heart Association advised that < 1500mg  is is ideal -try to avoid foods that contain any ingredients with names you do not recognize  -try to avoid foods with added sugar or sweeteners/sweets  -try to avoid sweet drinks -try to avoid white rice, white bread, pasta (unless whole grain)  EXERCISE GUIDELINES FOR ADULTS: -if you wish to increase your physical activity, do so gradually and with the approval of your doctor -STOP and seek medical care immediately if you have any chest pain, chest discomfort or trouble breathing when starting or increasing exercise  -move and stretch your body, legs, feet and arms when sitting for long periods -Physical activity guidelines for optimal health in adults: -get at least 150 minutes per week of moderate exercise (can talk, but not sing); this is about 20-30 minutes of sustained activity 5-7 days per week or two 10-15 minute episodes of sustained activity 5-7 days  per week -do some muscle building/resistance training at least 2 days per week  -balance exercises 3+ days per week:   Stand somewhere where you have something sturdy to hold onto if  you lose balance.    1) lift up on toes, start with 5x per day and work up to 20x   2) stand and lift on leg straight out to the side so that foot is a few inches of the floor, start with 5x each side and work up to 20x each side   3) stand on one foot, start with 5 seconds each side and work up to 20 seconds on each side  If you need ideas or help with getting more active:  -Silver sneakers https://tools.silversneakers.com  -Walk with a Doc: http://www.duncan-[email or web address]/  -try to include resistance (weight lifting/strength building) and balance exercises twice per week: or the following link for ideas: http://castillo-powell.com/  BuyDucts.dk  STRESS MANAGEMENT: -can try meditating, or just sitting quietly with deep breathing while intentionally relaxing all parts of your body for 5 minutes daily -if you need further help with stress, anxiety or depression please follow up with your primary doctor or contact the wonderful folks at [facility]: [phone]  SOCIAL CONNECTIONS: -options in [place] if you wish to engage in more social and exercise related activities:  -Silver sneakers https://tools.silversneakers.com  -Walk with a Doc: http://www.duncan-[email or web address]/  -Check out the [facility] Active Adults 50+ section on the [place] [employer] (hiking clubs, book clubs, cards and games, chess, exercise classes, aquatic classes and much more) - see the website for details: https://www.[place].gov/departments/parks-recreation/active-adults50  -YouTube has lots of exercise videos for different ages and abilities as well  -[facility] (a variety of indoor and outdoor inperson activities for adults). [phone]. [address]  -Virtual Online Classes (a variety of topics): see seniorplanet.org or call [phone]  -consider volunteering at a school,  hospice center, church, senior center or elsewhere

## 2023-05-22 NOTE — Progress Notes (Addendum)
 PATIENT CHECK-IN and HEALTH RISK ASSESSMENT QUESTIONNAIRE:  -completed by phone/video for upcoming Medicare Preventive Visit  Pre-Visit Check-in: 1)Vitals (height, wt, BP, etc) - record in vitals section for visit on day of visit Request home vitals (wt, BP, etc.) and enter into vitals, THEN update Vital Signs SmartPhrase below at the top of the HPI. See below.  2)Review and Update Medications, Allergies PMH, Surgeries, Social history in Epic 3)Hospitalizations in the last year with date/reason? no  4)Review and Update Care Team (patient's specialists) in Epic 5) Complete PHQ9 in Epic  6) Complete Fall Screening in Epic 7)Review all Health Maintenance Due and order under PCP if not done.  Medicare Wellness Patient Questionnaire:  Answer theses question about your habits: How often do you have a drink containing alcohol?n rarely will have a 1/2 glass of wine Have you ever smoked?n  On average, how many days per week do you engage in moderate to strenuous exercise (like a brisk walk)?walks daily, does over 5000 steps per day - usually more Typical diet: tries to eat healthy but does eat chips sometimes and pizza sometimes, veggie wraps, stir fry, salads  Beverages: water, power aid  Answer theses question about your everyday activities: Can you perform most household chores?y Are you deaf or have significant trouble hearing?some Do you feel that you have a problem with memory?n Do you feel safe at home?y Last dentist visit?  8. Do you have any difficulty performing your everyday activities?n Are you having any difficulty walking, taking medications on your own, and or difficulty managing daily home needs?n Do you have difficulty walking or climbing stairs?n Do you have difficulty dressing or bathing?n Do you have difficulty doing errands alone such as visiting a doctor's office or shopping?n Do you currently have any difficulty preparing food and eating?n Do you currently have any  difficulty using the toilet?n Do you have any difficulty managing your finances?n Do you have any difficulties with housekeeping of managing your housekeeping?n   Do you have Advanced Directives in place (Living Will, Healthcare Power or Attorney)? yes   Last eye Exam and location? Had cataract surgery in November and December and is doing well, Dr. Vonna Kotyk    Do you currently use prescribed or non-prescribed narcotic or opioid pain medications? no  Do you have a history or close family history of breast, ovarian, tubal or peritoneal cancer or a family member with BRCA (breast cancer susceptibility 1 and 2) gene mutations?       ----------------------------------------------------------------------------------------------------------------------------------------------------------------------------------------------------------------------  Because this visit was a virtual/telehealth visit, some criteria may be missing or patient reported. Any vitals not documented were not able to be obtained and vitals that have been documented are patient reported.    MEDICARE ANNUAL PREVENTIVE VISIT WITH PROVIDER: (Welcome to Medicare, initial annual wellness or annual wellness exam)  Virtual Visit via Phone Note  I connected with Molly Wilcox on 05/22/23 by phone and verified that I am speaking with the correct person using two identifiers.  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 Reports is doing well. Had a cough a few weeks ago but is now resolved. BP has been still running a little higher in the 150/79, sometimes runs lower.    See HM section in Epic for other details of completed HM.    ROS: negative for report of fevers, unintentional weight  loss, vision changes, vision loss, hearing loss or change, chest pain,  sob, hemoptysis, melena, hematochezia, hematuria, falls, bleeding or bruising, thoughts of suicide or self harm, memory loss  Patient-completed extensive health risk assessment - reviewed and discussed with the patient: See Health Risk Assessment completed with patient prior to the visit either above or in recent phone note. This was reviewed in detailed with the patient today and appropriate recommendations, orders and referrals were placed as needed per Summary below and patient instructions.   Review of Medical History: -PMH, PSH, Family History and current specialty and care providers reviewed and updated and listed below   Patient Care Team: [place], Betty G, MD as PCP - General (Family Medicine)   Past Medical History:  Diagnosis Date   High cholesterol    Hypertension    Osteoarthritis    Raynaud disease     Past Surgical History:  Procedure Laterality Date   TUBAL LIGATION      Social History   Socioeconomic History   Marital status: Widowed    Spouse name: Not on file   Number of children: Not on file   Years of education: Not on file   Highest education level: Bachelor's degree (e.g., BA, AB, BS)  Occupational History   Not on file  Tobacco Use   Smoking status: Never   Smokeless tobacco: Never  Vaping Use   Vaping status: Never Used  Substance and Sexual Activity   Alcohol use: Yes    Comment: social   Drug use: No   Sexual activity: Not Currently  Other Topics Concern   Not on file  Social History Narrative   Not on file   Social Drivers of Health   Financial Resource Strain: Low Risk  (06/16/2021)   Overall Financial Resource Strain (CARDIA)    Difficulty of Paying Living Expenses: Not hard at all  Food Insecurity: Unknown (06/22/2022)   Hunger Vital Sign    Worried About Running Out of Food in the Last Year: Never true    Ran Out of Food in the Last Year: Not on file  Transportation Needs: No Transportation Needs (06/22/2022)   PRAPARE -  Administrator, Civil Service (Medical): No    Lack of Transportation (Non-Medical): No  Physical Activity: Sufficiently Active (06/22/2022)   Exercise Vital Sign    Days of Exercise per Week: 7 days    Minutes of Exercise per Session: 90 min  Stress: No Stress Concern Present (06/22/2022)   [employer] of Occupational Health - Occupational Stress Questionnaire    Feeling of Stress : Not at all  Social Connections: Unknown (06/22/2022)   Social Connection and Isolation Panel [NHANES]    Frequency of Communication with Friends and Family: More than three times a week    Frequency of Social Gatherings with Friends and Family: Once a week    Attends Religious Services: Never    Database administrator or Organizations: Not on file    Attends Banker Meetings: Not on file    Marital Status: Widowed  Catering manager Violence: Not on file    Family History  Problem Relation Age of Onset   Heart disease Mother    Hypertension Father    Diabetes Father    Thyroid disease Neg Hx     Current Outpatient Medications on File Prior to Visit  Medication Sig Dispense Refill   albuterol (VENTOLIN HFA) 108 (90 Base) MCG/ACT inhaler Inhale 1-2 puffs into the lungs  every 6 (six) hours as needed for wheezing or shortness of breath. 8 g 0   amLODipine (NORVASC) 10 MG tablet TAKE 1 TABLET BY MOUTH EVERY DAY 90 tablet 3   Ascorbic Acid (VITAMIN C) 250 MG CHEW Chew 250 mg by mouth daily.     aspirin EC 81 MG tablet Take 81 mg by mouth daily.     benzonatate (TESSALON) 100 MG capsule Take 1 capsule (100 mg total) by mouth every 8 (eight) hours. 21 capsule 0   Calcium Carbonate Antacid (TUMS PO) Take 2 tablets by mouth daily as needed (heartburn).     Calcium-Phosphorus-Vitamin D (CALCIUM GUMMIES) 250-100-500 MG-MG-UNIT CHEW Chew 2 tablets by mouth daily.     denosumab (PROLIA) 60 MG/ML SOSY injection Inject 60 mg into the skin every 6 (six) months.     estradiol (ESTRACE) 0.1  MG/GM vaginal cream Place 0.5 g vaginally 2 (two) times a week. Place 0.5g nightly for two weeks then twice a week after 42.5 g 11   losartan (COZAAR) 50 MG tablet TAKE 1 TABLET BY MOUTH EVERY DAY 90 tablet 2   MAGNESIUM OXIDE PO Take 0.5 tablets by mouth daily.     MYRBETRIQ 50 MG TB24 tablet Take 50 mg by mouth daily.     Polyethylene Glycol 3350 (MIRALAX PO) Take 17 g by mouth as needed (constipation).     pravastatin (PRAVACHOL) 20 MG tablet TAKE 1 TABLET BY MOUTH EVERY DAY 90 tablet 3   promethazine-dextromethorphan (PROMETHAZINE-DM) 6.25-15 MG/5ML syrup Take 2.5 mLs by mouth 4 (four) times daily as needed for cough. 118 mL 0   No current facility-administered medications on file prior to visit.    Allergies  Allergen Reactions   Prednisone Other (See Comments)    Dehydration, amnesia    Ciprofloxacin Other (See Comments)    GI issues   Lisinopril-Hydrochlorothiazide Cough   Ibuprofen Nausea Only and Other (See Comments)    Nausea & headaches       Physical Exam Vitals requested from patient and listed below if patient had equipment and was able to obtain at home for this virtual visit: Vitals:   05/22/23 1527  BP: (!) 150/79   Estimated body mass index is 22.31 kg/m as calculated from the following:   Height as of 12/27/22: 5' 1.5" (1.562 m).   Weight as of this encounter: 120 lb (54.4 kg).  EKG (optional): deferred due to virtual visit  GENERAL: alert, oriented, no acute distress detected, full vision exam deferred due to pandemic and/or virtual encounter  PSYCH/NEURO: pleasant and cooperative, no obvious depression or anxiety, speech and thought processing grossly intact, Cognitive function grossly intact  Flowsheet Row Office Visit from 05/16/2022 in [facility]  PHQ-9 Total Score 0           05/22/2023    3:12 PM 05/16/2022   10:41 AM 03/13/2022    9:29 AM 02/14/2022   11:33 AM 11/21/2021    4:17 PM  Depression screen PHQ  2/9  Decreased Interest 0 0 0 0 0  Down, Depressed, Hopeless 0 0 0 0 0  PHQ - 2 Score 0 0 0 0 0  Altered sleeping  0   0  Tired, decreased energy  0   0  Change in appetite  0   0  Feeling bad or failure about yourself   0   0  Trouble concentrating  0   0  Moving slowly or fidgety/restless  0  0  Suicidal thoughts  0   0  PHQ-9 Score  0   0  Difficult doing work/chores  Not difficult at all   Not difficult at all       02/14/2022   11:33 AM 03/13/2022    9:28 AM 05/16/2022   10:41 AM 06/22/2022    9:48 AM 05/22/2023    3:12 PM  Fall Risk  Falls in the past year? 0 0 0 0 0  Was there an injury with Fall? 0 0 0  0  Fall Risk Category Calculator 0 0 0  0  Fall Risk Category (Retired) Low Low     (RETIRED) Patient Fall Risk Level High fall risk Low fall risk     Patient at Risk for Falls Due to History of fall(s);Impaired balance/gait No Fall Risks     Fall risk Follow up Falls evaluation completed Falls evaluation completed   Falls evaluation completed     SUMMARY AND PLAN:  Encounter for Medicare annual wellness exam  Discussed applicable health maintenance/preventive health measures and advised and referred or ordered per patient preferences: -discussed covid vaccines recs -she agrees to monitor BP and work on dietary changes per below and reports has follow up with Dr. [place] soon and will get labs then Health Maintenance  Topic Date Due   COVID-19 Vaccine (8 - 2024-25 season) 02/12/2023   Diabetic kidney evaluation - Urine ACR  12/27/2027 (Originally 11/02/2022)   FOOT EXAM  06/26/2023   HEMOGLOBIN A1C  06/26/2023   OPHTHALMOLOGY EXAM  09/13/2023   Diabetic kidney evaluation - eGFR measurement  12/27/2023   Medicare Annual Wellness (AWV)  05/21/2024   DTaP/Tdap/Td (2 - Td or Tdap) 09/28/2026   Pneumonia Vaccine 25+ Years old  Completed   INFLUENZA VACCINE  Completed   DEXA SCAN  Completed   Zoster Vaccines- Shingrix  Completed   HPV VACCINES  Aged Out       Education and counseling on the following was provided based on the above review of health and a plan/checklist for the patient, along with additional information discussed, was provided for the patient in the patient instructions :   -Advised and counseled on a healthy lifestyle - including the importance of a healthy diet, regular physical activity,  -Reviewed patient's current diet. Advised and counseled on a whole foods based healthy diet. A summary of a healthy diet was provided in the Patient Instructions. Discussed specific EB recs for elevated BP.  -reviewed patient's current physical activity level and discussed exercise guidelines for adults. Discussed community resources and ideas for safe exercise at home to assist in meeting exercise guideline recommendations in a safe and healthy way.  -Advise yearly dental visits at minimum and regular eye exams -Advised and counseled on alcohol safe limits, risks  Follow up: see patient instructions     Patient Instructions  I really enjoyed getting to talk with you today! I am available on Tuesdays and Thursdays for virtual visits if you have any questions or concerns, or if I can be of any further assistance.   CHECKLIST FROM ANNUAL WELLNESS VISIT:  -Follow up (please call to schedule if not scheduled after visit):   -yearly for annual wellness visit with primary care office  Here is a list of your preventive care/health maintenance measures and the plan for each if any are due:  PLAN For any measures below that may be due:   Health Maintenance  Topic Date Due   COVID-19 Vaccine (8 -  2024-25 season) 02/12/2023   Diabetic kidney evaluation - Urine ACR  12/27/2027 (Originally 11/02/2022)   FOOT EXAM  06/26/2023   HEMOGLOBIN A1C  06/26/2023   OPHTHALMOLOGY EXAM  09/13/2023   Diabetic kidney evaluation - eGFR measurement  12/27/2023   Medicare Annual Wellness (AWV)  05/21/2024   DTaP/Tdap/Td (2 - Td or Tdap) 09/28/2026    Pneumonia Vaccine 35+ Years old  Completed   INFLUENZA VACCINE  Completed   DEXA SCAN  Completed   Zoster Vaccines- Shingrix  Completed   HPV VACCINES  Aged Out    -See a dentist at least yearly  -Get your eyes checked and then per your eye specialist's recommendations  -Other issues addressed today:   - monitor blood pressure, goal 120/70 - see dietary recommendations below.   -I have included below further information regarding a healthy whole foods based diet, physical activity guidelines for adults, stress management and opportunities for social connections. I hope you find this information useful.   -----------------------------------------------------------------------------------------------------------------------------------------------------------------------------------------------------------------------------------------------------------    NUTRITION: -eat real food: lots of colorful vegetables (half the plate) and fruits -5-7 servings of vegetables and fruits per day (fresh or steamed is best), exp. 2 servings of vegetables with lunch and dinner and 2 servings of fruit per day. Berries and greens such as kale and collards are great choices.  -consume on a regular basis:  fresh fruits, fresh veggies, fish, nuts, seeds, healthy oils (such as olive oil, avocado oil), whole grains (make sure first ingredient on label contains the word "whole"). -can eat small amounts of dairy and lean meat (no larger than the palm of your hand), but avoid processed meats such as ham, bacon, lunch meat, etc. -drink water -try to avoid fast food and pre-packaged foods, processed meat, ultra processed foods (donuts, candy, etc.) -most experts advise limiting sodium to < 2300mg  per day, should limit further is any chronic conditions such as high blood pressure, heart disease, diabetes, etc. The American Heart Association advised that < 1500mg  is is ideal -try to avoid foods that contain any  ingredients with names you do not recognize  -try to avoid foods with added sugar or sweeteners/sweets  -try to avoid sweet drinks -try to avoid white rice, white bread, pasta (unless whole grain)  EXERCISE GUIDELINES FOR ADULTS: -if you wish to increase your physical activity, do so gradually and with the approval of your doctor -STOP and seek medical care immediately if you have any chest pain, chest discomfort or trouble breathing when starting or increasing exercise  -move and stretch your body, legs, feet and arms when sitting for long periods -Physical activity guidelines for optimal health in adults: -get at least 150 minutes per week of moderate exercise (can talk, but not sing); this is about 20-30 minutes of sustained activity 5-7 days per week or two 10-15 minute episodes of sustained activity 5-7 days per week -do some muscle building/resistance training at least 2 days per week  -balance exercises 3+ days per week:   Stand somewhere where you have something sturdy to hold onto if you lose balance.    1) lift up on toes, start with 5x per day and work up to 20x   2) stand and lift on leg straight out to the side so that foot is a few inches of the floor, start with 5x each side and work up to 20x each side   3) stand on one foot, start with 5 seconds each side and work up to 20 seconds  on each side  If you need ideas or help with getting more active:  -Silver sneakers https://tools.silversneakers.com  -Walk with a Doc: http://www.duncan-williams.com/  -try to include resistance (weight lifting/strength building) and balance exercises twice per week: or the following link for ideas: http://castillo-powell.com/  BuyDucts.dk  STRESS MANAGEMENT: -can try meditating, or just sitting quietly with deep breathing while intentionally relaxing all parts of your body for 5 minutes daily -if you need  further help with stress, anxiety or depression please follow up with your primary doctor or contact the wonderful folks at [facility]: [phone]  SOCIAL CONNECTIONS: -options in [place] if you wish to engage in more social and exercise related activities:  -Silver sneakers https://tools.silversneakers.com  -Walk with a Doc: http://www.duncan-williams.com/  -Check out the [facility] Active Adults 50+ section on the [place] of [employer] (hiking clubs, book clubs, cards and games, chess, exercise classes, aquatic classes and much more) - see the website for details: https://www.[place].gov/departments/parks-recreation/active-adults50  -YouTube has lots of exercise videos for different ages and abilities as well  -[facility] (a variety of indoor and outdoor inperson activities for adults). [phone]. [address]  -Virtual Online Classes (a variety of topics): see seniorplanet.org or call [phone]  -consider volunteering at a school, hospice center, church, senior center or elsewhere            Terressa Koyanagi, DO

## 2023-06-05 ENCOUNTER — Telehealth: Payer: Self-pay | Admitting: Family Medicine

## 2023-06-05 NOTE — Telephone Encounter (Addendum)
 Pt has been sch for cpe and prolia injection on 06-27-2023

## 2023-06-05 NOTE — Telephone Encounter (Signed)
 Copied from CRM [id number]. Topic: Appointments - Scheduling Inquiry for Clinic >> Jun 05, 2023 12:55 PM Molly Wilcox wrote: Reason for CRM: Patient wants to schedule for her 6 month Prolia injection. Please call patient.

## 2023-06-05 NOTE — Telephone Encounter (Signed)
 Please schedule pt's CPE on or after 06/26/23 and we'll give it the same day.

## 2023-06-26 ENCOUNTER — Telehealth: Payer: Self-pay

## 2023-06-26 NOTE — Telephone Encounter (Signed)
 Copied from CRM [id number]. Topic: General - Other >> Jun 26, 2023  8:20 AM Fredrich Romans wrote: Reason for CRM: Patient called in to let provider know that she got her Covid vaccine in September 16,2024 of last year at [facility]. She said that she turned it in to [place] CMA

## 2023-06-26 NOTE — Telephone Encounter (Signed)
 This is already documented in pt's chart.

## 2023-06-27 ENCOUNTER — Encounter: Payer: Self-pay | Admitting: Family Medicine

## 2023-06-27 ENCOUNTER — Ambulatory Visit (INDEPENDENT_AMBULATORY_CARE_PROVIDER_SITE_OTHER): Admitting: Family Medicine

## 2023-06-27 VITALS — BP 140/80 | HR 64 | Temp 98.0°F | Resp 16 | Ht 61.5 in | Wt 125.6 lb

## 2023-06-27 DIAGNOSIS — E559 Vitamin D deficiency, unspecified: Secondary | ICD-10-CM

## 2023-06-27 DIAGNOSIS — E7800 Pure hypercholesterolemia, unspecified: Secondary | ICD-10-CM

## 2023-06-27 DIAGNOSIS — E042 Nontoxic multinodular goiter: Secondary | ICD-10-CM | POA: Diagnosis not present

## 2023-06-27 DIAGNOSIS — M816 Localized osteoporosis [Lequesne]: Secondary | ICD-10-CM

## 2023-06-27 DIAGNOSIS — I10 Essential (primary) hypertension: Secondary | ICD-10-CM

## 2023-06-27 DIAGNOSIS — Z0000 Encounter for general adult medical examination without abnormal findings: Secondary | ICD-10-CM

## 2023-06-27 DIAGNOSIS — R7303 Prediabetes: Secondary | ICD-10-CM | POA: Diagnosis not present

## 2023-06-27 LAB — COMPREHENSIVE METABOLIC PANEL WITH GFR
ALT: 23 U/L (ref 0–35)
AST: 27 U/L (ref 0–37)
Albumin: 5.1 g/dL (ref 3.5–5.2)
Alkaline Phosphatase: 44 U/L (ref 39–117)
BUN: 25 mg/dL — ABNORMAL HIGH (ref 6–23)
CO2: 26 meq/L (ref 19–32)
Calcium: 9.9 mg/dL (ref 8.4–10.5)
Chloride: 103 meq/L (ref 96–112)
Creatinine, Ser: 0.78 mg/dL (ref 0.40–1.20)
GFR: 70.2 mL/min (ref >60.00)
Glucose, Bld: 83 mg/dL (ref 70–99)
Potassium: 3.8 meq/L (ref 3.5–5.1)
Sodium: 139 meq/L (ref 135–145)
Total Bilirubin: 0.7 mg/dL (ref 0.2–1.2)
Total Protein: 8 g/dL (ref 6.0–8.3)

## 2023-06-27 LAB — LIPID PANEL
Cholesterol: 160 mg/dL (ref 0–200)
HDL: 69.3 mg/dL (ref >39.00)
LDL Cholesterol: 79 mg/dL (ref 0–99)
NonHDL: 90.35
Total CHOL/HDL Ratio: 2
Triglycerides: 56 mg/dL (ref 0.0–149.0)
VLDL: 11.2 mg/dL (ref 0.0–40.0)

## 2023-06-27 LAB — HEMOGLOBIN A1C: Hgb A1c MFr Bld: 5.9 % (ref 4.6–6.5)

## 2023-06-27 MED ORDER — AMLODIPINE-OLMESARTAN 10-20 MG PO TABS: 1.0000 | ORAL_TABLET | Freq: Every day | ORAL | 1 refills | Status: DC

## 2023-06-27 MED ORDER — DENOSUMAB 60 MG/ML ~~LOC~~ SOSY
60.0000 mg | PREFILLED_SYRINGE | Freq: Once | SUBCUTANEOUS | Status: AC
Start: 2023-06-27 — End: 2023-06-27
  Administered 2023-06-27: 60 mg via SUBCUTANEOUS

## 2023-06-27 NOTE — Assessment & Plan Note (Signed)
 Continue current dose of vitamin D supplementation. Further recommendation will be given according to 25 OH vitamin D result.

## 2023-06-27 NOTE — Assessment & Plan Note (Signed)
Encouraged consistency with a healthy lifestyle for diabetes prevention. Further recommendation will be given according to hemoglobin A1c result. 

## 2023-06-27 NOTE — Progress Notes (Unsigned)
 HPI: Molly Wilcox is a 83 y.o. female  with PMHx significant for hypertension, osteoporosis, prediabetes, and atherosclerosis of aorta, who is here today for her routine physical.  Last CPE: 05/04/2021  Exercise: Walking about 7,000 steps a day.  Diet: Eating home cooked meals most of the time. Not eating enough vegetables daily.  Sleep: Gets 7 hours of sleep per night.  Smoking: Never.  Alcohol consumption:  Dental: UTD.  Vision: UTD. Recently had cataracts surgery, which has overall improved her vision.   Chronic medical problems:   Hypertension:  Medications: Amlodipine 10 mg daily and Losartan 50 mg daily.  Side effects: None.  BP readings at home: 140's/70's.  She has not been monitoring her BP as regularly as she previously did. She attributes her elevated BP to recent stress from having cataracts surgery.  Negative for exertional chest pain, dyspnea,  focal weakness, or edema. Lab Results  Component Value Date   CREATININE 0.86 12/27/2022   BUN 24 (H) 12/27/2022   NA 141 12/27/2022   K 4.0 12/27/2022   CL 105 12/27/2022   CO2 28 12/27/2022   Pure Hypercholesterolemia  Currently on Pravastatin 20 daily. Following a low fat diet: Eating home cooked meals.  Lab Results  Component Value Date   CHOL 158 06/26/2022   HDL 70.30 06/26/2022   LDLCALC 77 06/26/2022   TRIG 55.0 06/26/2022   CHOLHDL 2 06/26/2022   Vitamin D deficiency: She has been taking OTC vitamin D supplementation (gummies).   Lab Results  Component Value Date   VD25OH 45.90 06/26/2022   VD25OH 53.06 05/04/2021   VD25OH 42.57 10/27/2020   Overactive bladder/urine incontinence She is also taking Gemtesa 75 mg once daily.  Tolerating well with no adverse side effects reported.  No acute concerns in this regard.   Prediabetes: Negative for polydipsia, polyuria, polyphagia. Lab Results  Component Value Date   HGBA1C 5.6 12/27/2022   Immunization History  Administered Date(s)  Administered   Fluad Quad(high Dose 65+) 12/25/2018, 12/21/2020, 12/05/2022   Influenza, High Dose Seasonal PF 01/02/2016, 01/29/2017, 01/05/2018, 01/05/2020, 12/29/2021   Influenza-Unspecified 01/29/2017, 01/01/2018, 12/25/2018   PFIZER Comirnaty(Gray Top)Covid-19 Tri-Sucrose Vaccine 09/08/2020   PFIZER(Purple Top)SARS-COV-2 Vaccination 05/27/2019, 06/17/2019, 01/29/2020, 12/18/2022, 12/18/2022   PNEUMOCOCCAL CONJUGATE-20 12/29/2021   Pfizer Covid-19 Vaccine Bivalent Booster 82yrs & up 03/22/2021, 12/18/2022   Pfizer(Comirnaty)Fall Seasonal Vaccine 12 years and older 01/18/2022   Pneumococcal Conjugate-13 03/30/2014   Pneumococcal Polysaccharide-23 12/29/2014   RSV,unspecified 12/05/2022   Tdap 09/27/2016   Zoster Recombinant(Shingrix) 01/28/2019, 04/12/2019   Health Maintenance  Topic Date Due   HEMOGLOBIN A1C  06/26/2023   COVID-19 Vaccine (8 - 2024-25 season) 12/28/2023 (Originally 02/12/2023)   Diabetic kidney evaluation - Urine ACR  12/27/2027 (Originally 11/02/2022)   OPHTHALMOLOGY EXAM  09/13/2023   Diabetic kidney evaluation - eGFR measurement  12/27/2023   Medicare Annual Wellness (AWV)  05/21/2024   DTaP/Tdap/Td (2 - Td or Tdap) 09/28/2026   Pneumonia Vaccine 30+ Years old  Completed   INFLUENZA VACCINE  Completed   DEXA SCAN  Completed   Zoster Vaccines- Shingrix  Completed   HPV VACCINES  Aged Out   FOOT EXAM  Discontinued   She will be moving to [place] soon and plans to move into a senior living facility.  Her daughter lives in [place] and she will have more support there.  She also hopes it will be better for her to have more social interactions.  Review of Systems  Constitutional:  Negative for  activity change, appetite change and fever.  HENT:  Negative for mouth sores, sore throat and trouble swallowing.   Eyes:  Negative for redness and visual disturbance.  Respiratory:  Negative for cough, shortness of breath and wheezing.   Cardiovascular:  Negative for  chest pain and leg swelling.  Gastrointestinal:  Negative for abdominal pain, nausea and vomiting.  Endocrine: Negative for cold intolerance and heat intolerance.  Genitourinary:  Negative for decreased urine volume, dysuria and hematuria.  Musculoskeletal:  Negative for gait problem and myalgias.  Skin:  Negative for color change and rash.  Allergic/Immunologic: Negative for environmental allergies.  Neurological:  Negative for syncope, weakness and headaches.  Psychiatric/Behavioral:  Negative for confusion. The patient is not nervous/anxious.   All other systems reviewed and are negative.  Current Outpatient Medications on File Prior to Visit  Medication Sig Dispense Refill   Ascorbic Acid (VITAMIN C) 250 MG CHEW Chew 250 mg by mouth daily.     aspirin EC 81 MG tablet Take 81 mg by mouth daily.     Calcium Carbonate Antacid (TUMS PO) Take 2 tablets by mouth daily as needed (heartburn).     Calcium-Phosphorus-Vitamin D (CALCIUM GUMMIES) 250-100-500 MG-MG-UNIT CHEW Chew 2 tablets by mouth daily.     denosumab (PROLIA) 60 MG/ML SOSY injection Inject 60 mg into the skin every 6 (six) months.     estradiol (ESTRACE) 0.1 MG/GM vaginal cream Place 0.5 g vaginally 2 (two) times a week. Place 0.5g nightly for two weeks then twice a week after 42.5 g 11   MAGNESIUM OXIDE PO Take 0.5 tablets by mouth daily.     Polyethylene Glycol 3350 (MIRALAX PO) Take 17 g by mouth as needed (constipation).     pravastatin (PRAVACHOL) 20 MG tablet TAKE 1 TABLET BY MOUTH EVERY DAY 90 tablet 3   promethazine-dextromethorphan (PROMETHAZINE-DM) 6.25-15 MG/5ML syrup Take 2.5 mLs by mouth 4 (four) times daily as needed for cough. 118 mL 0   Vibegron (GEMTESA) 75 MG TABS Take 1 tablet by mouth daily at 12 noon.     No current facility-administered medications on file prior to visit.   Past Medical History:  Diagnosis Date   High cholesterol    Hypertension    Osteoarthritis    Raynaud disease    Past Surgical  History:  Procedure Laterality Date   TUBAL LIGATION     Allergies  Allergen Reactions   Prednisone Other (See Comments)    Dehydration, amnesia    Ciprofloxacin Other (See Comments)    GI issues   Lisinopril-Hydrochlorothiazide Cough   Ibuprofen Nausea Only and Other (See Comments)    Nausea & headaches    Family History  Problem Relation Age of Onset   Heart disease Mother    Hypertension Father    Diabetes Father    Thyroid disease Neg Hx     Social History   Socioeconomic History   Marital status: Widowed    Spouse name: Not on file   Number of children: Not on file   Years of education: Not on file   Highest education level: Master's degree (e.g., MA, MS, MEng, MEd, MSW, MBA)  Occupational History   Not on file  Tobacco Use   Smoking status: Never   Smokeless tobacco: Never  Vaping Use   Vaping status: Never Used  Substance and Sexual Activity   Alcohol use: Yes    Comment: social   Drug use: No   Sexual activity: Not Currently  Other Topics  Concern   Not on file  Social History Narrative   Not on file   Social Drivers of Health   Financial Resource Strain: Low Risk  (06/26/2023)   Overall Financial Resource Strain (CARDIA)    Difficulty of Paying Living Expenses: Not hard at all  Food Insecurity: No Food Insecurity (06/26/2023)   Hunger Vital Sign    Worried About Running Out of Food in the Last Year: Never true    Ran Out of Food in the Last Year: Never true  Transportation Needs: No Transportation Needs (06/26/2023)   PRAPARE - Administrator, Civil Service (Medical): No    Lack of Transportation (Non-Medical): No  Physical Activity: Unknown (06/26/2023)   Exercise Vital Sign    Days of Exercise per Week: 7 days    Minutes of Exercise per Session: Not on file  Stress: No Stress Concern Present (06/26/2023)   [employer] of Occupational Health - Occupational Stress Questionnaire    Feeling of Stress : Not at all  Social  Connections: Unknown (06/26/2023)   Social Connection and Isolation Panel [NHANES]    Frequency of Communication with Friends and Family: More than three times a week    Frequency of Social Gatherings with Friends and Family: Patient declined    Attends Religious Services: Patient declined    Database administrator or Organizations: Yes    Attends Engineer, structural: More than 4 times per year    Marital Status: Widowed   Vitals:   06/27/23 0958 06/27/23 1033  BP: (!) 142/80 (!) 140/80  Pulse: 64   Resp: 16   Temp: 98 F (36.7 C)   SpO2: 98%    Body mass index is 23.35 kg/m.  Wt Readings from Last 3 Encounters:  06/27/23 125 lb 9.6 oz (57 kg)  05/22/23 120 lb (54.4 kg)  12/27/22 121 lb 4 oz (55 kg)   Physical Exam Vitals and nursing note reviewed.  Constitutional:      General: She is not in acute distress.    Appearance: She is well-developed.  HENT:     Head: Normocephalic and atraumatic.     Right Ear: External ear normal.     Left Ear: External ear normal.     Ears:     Comments: Cerumen excxes bilateral, could not see TM's.    Mouth/Throat:     Mouth: Mucous membranes are moist.     Pharynx: Oropharynx is clear. Uvula midline.  Eyes:     Conjunctiva/sclera: Conjunctivae normal.     Pupils: Pupils are equal, round, and reactive to light.  Neck:     Thyroid: No thyroid mass.  Cardiovascular:     Rate and Rhythm: Normal rate and regular rhythm.     Pulses:          Dorsalis pedis pulses are 2+ on the right side and 2+ on the left side.     Heart sounds: No murmur heard. Pulmonary:     Effort: Pulmonary effort is normal. No respiratory distress.     Breath sounds: Normal breath sounds.  Abdominal:     Palpations: Abdomen is soft. There is no hepatomegaly or mass.     Tenderness: There is no abdominal tenderness.  Genitourinary:    Comments: Deferred to gynecologist. Musculoskeletal:     Right lower leg: 1+ Pitting Edema present.     Left lower  leg: 1+ Pitting Edema present.     Comments: No signs of synovitis  appreciated.  Lymphadenopathy:     Cervical: No cervical adenopathy.  Skin:    General: Skin is warm.     Findings: No erythema or rash.  Neurological:     General: No focal deficit present.     Mental Status: She is alert and oriented to person, place, and time.     Cranial Nerves: No cranial nerve deficit.     Coordination: Coordination normal.     Gait: Gait normal.     Deep Tendon Reflexes:     Reflex Scores:      Bicep reflexes are 2+ on the right side and 2+ on the left side.      Patellar reflexes are 2+ on the right side and 2+ on the left side. Psychiatric:        Mood and Affect: Mood and affect normal.   ASSESSMENT AND PLAN: [name] was here today annual physical examination.  Orders Placed This Encounter  Procedures   Comprehensive metabolic panel   VITAMIN D 25 Hydroxy (Vit-D Deficiency, Fractures)   Lipid panel   TSH   Hemoglobin A1c   Lab Results  Component Value Date   NA 139 06/27/2023   CL 103 06/27/2023   K 3.8 06/27/2023   CO2 26 06/27/2023   BUN 25 (H) 06/27/2023   CREATININE 0.78 06/27/2023   GFR 70.20 06/27/2023   CALCIUM 9.9 06/27/2023   ALBUMIN 5.1 06/27/2023   GLUCOSE 83 06/27/2023   Lab Results  Component Value Date   ALT 23 06/27/2023   AST 27 06/27/2023   ALKPHOS 44 06/27/2023   BILITOT 0.7 06/27/2023   Lab Results  Component Value Date   VD25OH 46.84 06/27/2023   Lab Results  Component Value Date   CHOL 160 06/27/2023   HDL 69.30 06/27/2023   LDLCALC 79 06/27/2023   TRIG 56.0 06/27/2023   CHOLHDL 2 06/27/2023   Lab Results  Component Value Date   TSH 1.08 06/27/2023   Lab Results  Component Value Date   HGBA1C 5.9 06/27/2023   Routine general medical examination at a health care facility Assessment & Plan: We discussed the importance of regular physical activity and healthy diet for prevention of chronic illness and/or  complications. Preventive guidelines reviewed. Vaccination-today. Ca++ and vit D supplementation to continue. She follows with gynecologist annually. Next CPE in a year.   Localized osteoporosis without current pathological fracture Assessment & Plan: Last DEXA in 05/2022. Currently on Prolia every 6 months. Continue adequate calcium and vitamin D for supplementation as well as fall prevention.  Orders: -     Denosumab  Essential hypertension Assessment & Plan: BP mildly elevated today, reporting similar readings at home. She agrees with changing to a combination.,  Continue amlodipine 10 mg daily, stop losartan, and start olmesartan 20 mg daily.  Amlodipine-olmesartan 10-20 mg sent to her pharmacy. Continue monitoring BP regularly and instructed to let me know about BP readings in 2 to 3 weeks. Continue low-salt diet. Eye exam is current. Follow-up in 5 months, before if needed.  Orders: -     Comprehensive metabolic panel; Future -     amLODIPine-Olmesartan; Take 1 tablet by mouth daily.  Dispense: 90 tablet; Refill: 1  Prediabetes -     Comprehensive metabolic panel; Future -     Hemoglobin A1c; Future  Multinodular goiter Assessment & Plan: She has not followed with endocrinology in about 2 years. Problem has been stable. TSH ordered today and will continue annual follow-up.  Orders: -     TSH; Future  Pure hypercholesterolemia Assessment & Plan: With history of aortic atherosclerosis. Continue pravastatin 20 mg daily and low-fat diet. Further recommendation will be given according to lab results.  Orders: -     Comprehensive metabolic panel; Future -     Lipid panel; Future  Vitamin D deficiency, unspecified Assessment & Plan: Continue current dose of vitamin D supplementation. Further recommendation will be given according to 25 OH vitamin D result.  Orders: -     VITAMIN D 25 Hydroxy (Vit-D Deficiency, Fractures); Future    Return in 5 months (on  11/27/2023) for chronic problems.   I, Isabelle Course, acting as a scribe for Latreece Mochizuki [place], MD., have documented all relevant documentation on the behalf of Darrelle Barrell [place], MD, as directed by  Khole Arterburn [place], MD while in the presence of Mayco Walrond [place], MD.  I, Kamillah Didonato [place], MD, have reviewed all documentation for this visit. The documentation on 06/27/23 for the exam, diagnosis, procedures, and orders are all accurate and complete.  Genie Mirabal G. [place], MD  [facility]. [place] [facility].

## 2023-06-27 NOTE — Assessment & Plan Note (Signed)
 We discussed the importance of regular physical activity and healthy diet for prevention of chronic illness and/or complications. Preventive guidelines reviewed. Vaccination-today. Ca++ and vit D supplementation to continue. She follows with gynecologist annually. Next CPE in a year.

## 2023-06-27 NOTE — Patient Instructions (Addendum)
 A few things to remember from today's visit:  Routine general medical examination at a health care facility  Localized osteoporosis without current pathological fracture - Plan: denosumab (PROLIA) injection 60 mg  Essential hypertension - Plan: Comprehensive metabolic panel  Prediabetes - Plan: Comprehensive metabolic panel, Hemoglobin A1c  Multinodular goiter - Plan: TSH  Pure hypercholesterolemia - Plan: Comprehensive metabolic panel, Lipid panel  Vitamin D deficiency, unspecified - Plan: VITAMIN D 25 Hydroxy (Vit-D Deficiency, Fractures)  Blood pressure medications changed to combination pill. Losartan discontinued, Olmesartan started. Blood pressure readings in 2-3 weeks.  If you need refills for medications you take chronically, please call your pharmacy. Do not use My Chart to request refills or for acute issues that need immediate attention. If you send a my chart message, it may take a few days to be addressed, specially if I am not in the office.  Please be sure medication list is accurate. If a new problem present, please set up appointment sooner than planned today.

## 2023-06-27 NOTE — Assessment & Plan Note (Signed)
 BP mildly elevated today, reporting similar readings at home. She agrees with changing to a combination.,  Continue amlodipine 10 mg daily, stop losartan, and start olmesartan 20 mg daily.  Amlodipine-olmesartan 10-20 mg sent to her pharmacy. Continue monitoring BP regularly and instructed to let me know about BP readings in 2 to 3 weeks. Continue low-salt diet. Eye exam is current. Follow-up in 5 months, before if needed.

## 2023-06-27 NOTE — Assessment & Plan Note (Signed)
 Last DEXA in 05/2022. Currently on Prolia every 6 months. Continue adequate calcium and vitamin D for supplementation as well as fall prevention.

## 2023-06-27 NOTE — Assessment & Plan Note (Signed)
 With history of aortic atherosclerosis. Continue pravastatin 20 mg daily and low-fat diet. Further recommendation will be given according to lab results.

## 2023-06-27 NOTE — Assessment & Plan Note (Signed)
 She has not followed with endocrinology in about 2 years. Problem has been stable. TSH ordered today and will continue annual follow-up.

## 2023-06-28 ENCOUNTER — Encounter: Payer: Self-pay | Admitting: Family Medicine

## 2023-06-28 LAB — TSH: TSH: 1.08 u[IU]/mL (ref 0.35–5.50)

## 2023-06-28 LAB — VITAMIN D 25 HYDROXY (VIT D DEFICIENCY, FRACTURES): VITD: 46.84 ng/mL (ref 30.00–100.00)

## 2023-06-30 ENCOUNTER — Other Ambulatory Visit: Payer: Self-pay | Admitting: Family Medicine

## 2023-06-30 DIAGNOSIS — I10 Essential (primary) hypertension: Secondary | ICD-10-CM

## 2023-07-16 ENCOUNTER — Encounter: Payer: Self-pay | Admitting: Obstetrics and Gynecology

## 2023-07-16 ENCOUNTER — Ambulatory Visit: Payer: PPO | Admitting: Obstetrics and Gynecology

## 2023-07-16 VITALS — BP 130/77 | HR 89

## 2023-07-16 DIAGNOSIS — N368 Other specified disorders of urethra: Secondary | ICD-10-CM

## 2023-07-16 DIAGNOSIS — N8110 Cystocele, unspecified: Secondary | ICD-10-CM

## 2023-07-16 DIAGNOSIS — N952 Postmenopausal atrophic vaginitis: Secondary | ICD-10-CM

## 2023-07-16 DIAGNOSIS — N812 Incomplete uterovaginal prolapse: Secondary | ICD-10-CM | POA: Diagnosis not present

## 2023-07-16 NOTE — Patient Instructions (Signed)
 Please use the estrogen cream every night for the next two weeks and then three times weekly after.

## 2023-07-16 NOTE — Progress Notes (Signed)
 [facility]   Subjective:     Chief Complaint:  Chief Complaint  Patient presents with   Follow-up    Pt here for pessary f/u, pt stated she has some leakage in her underwear sometimes and it causes irration     History of Present Illness: Molly Wilcox is a 83 y.o. female with stage II pelvic organ prolapse who presents for a pessary check. She is using a size #2 incontinence dish pessary. The pessary has been working well and she has no complaints. She is using vaginal estrogen cream, but reports she is not consistent. She reports small spotting on her panties unsure if from rectum or vagina.   Past Medical History: Patient  has a past medical history of High cholesterol, Hypertension, Osteoarthritis, and Raynaud disease.   Past Surgical History: She  has a past surgical history that includes Tubal ligation.   Medications: She has a current medication list which includes the following prescription(s): amlodipine-olmesartan, vitamin c, aspirin ec, calcium carbonate antacid, calcium gummies, denosumab, magnesium oxide, polyethylene glycol 3350, pravastatin, gemtesa, and estradiol.   Allergies: Patient is allergic to prednisone, ciprofloxacin, lisinopril-hydrochlorothiazide, and ibuprofen.   Social History: Patient  reports that she has never smoked. She has never used smokeless tobacco. She reports current alcohol use. She reports that she does not use drugs.      Objective:    Physical Exam: BP 130/77 (BP Location: Left Arm, Patient Position: Sitting, Cuff Size: Normal)   Pulse 89  Gen: No apparent distress, A&O x 3. Detailed Urogynecologic Evaluation:  Pelvic Exam: Normal external female genitalia; Bartholin's and Skene's glands normal in appearance; urethral meatus prolapsed, no urethral masses or discharge. The pessary was noted to be in place. It was removed and cleaned. Speculum exam revealed no lesions in the vagina. The pessary was replaced. It was  comfortable to the patient and fit well.    Assessment/Plan:    Assessment: Molly Wilcox is a 83 y.o. with stage II pelvic organ prolapse here for a pessary check. She is doing well.  Plan: She will keep the pessary in place until next visit. She will continue to use estrogen. She will follow-up in 3 months for a pessary check or sooner as needed. Encouraged her to use the estrogen cream consistently.   Patient may be moving to [place]  this year. I encouraged her to let me know if she is moves so we can attempt to coordinate sending her records up to [place] .   All questions were answered.

## 2023-09-08 ENCOUNTER — Ambulatory Visit (HOSPITAL_COMMUNITY)
Admission: EM | Admit: 2023-09-08 | Discharge: 2023-09-08 | Disposition: A | Discharge status: Home / Self Care | Attending: Nurse Practitioner | Admitting: Nurse Practitioner

## 2023-09-08 ENCOUNTER — Other Ambulatory Visit: Payer: Self-pay

## 2023-09-08 ENCOUNTER — Other Ambulatory Visit: Payer: Self-pay | Admitting: Obstetrics and Gynecology

## 2023-09-08 ENCOUNTER — Encounter (HOSPITAL_COMMUNITY): Payer: Self-pay | Admitting: *Deleted

## 2023-09-08 DIAGNOSIS — N952 Postmenopausal atrophic vaginitis: Secondary | ICD-10-CM

## 2023-09-08 DIAGNOSIS — R220 Localized swelling, mass and lump, head: Secondary | ICD-10-CM | POA: Diagnosis not present

## 2023-09-08 DIAGNOSIS — L239 Allergic contact dermatitis, unspecified cause: Secondary | ICD-10-CM

## 2023-09-08 DIAGNOSIS — T7840XA Allergy, unspecified, initial encounter: Secondary | ICD-10-CM

## 2023-09-08 MED ORDER — METHYLPREDNISOLONE 4 MG PO TBPK: ORAL_TABLET | ORAL | 0 refills | Status: AC

## 2023-09-08 MED ORDER — NAPHAZOLINE-PHENIRAMINE 0.025-0.3 % OP SOLN
1.0000 [drp] | OPHTHALMIC | 0 refills | Status: AC | PRN
Start: 2023-09-08 — End: ?

## 2023-09-08 NOTE — ED Provider Notes (Signed)
 [facility]    CSN: [id number] Arrival date & time: 09/08/23  1008      History   Chief Complaint Chief Complaint  Patient presents with   swelling upper lids    HPI Molly Wilcox is a 83 y.o. female.   HPI  She is in today for evaluation of swelling to her eyelids.  She endorses that she is planning a move to [place]  with her children.  She is having some home renovations.  She endorses that they have taken a car.  And they were repairing the fireplace.  She was also moving papers out of boxes and noticed the fine dusk was all longer.  She endorsed that she was fine on yesterday however when she woke up this morning she had swelling to her eyelids.  She denies any blurred vision, double vision or changes in vision.  She is status post cataract surgery in the past tolerated well.  She denies any fever, chills, headaches, dizziness, difficulty swallowing, chest pain, shortness of breath, nausea or vomiting.  She endorses that she has taking Benadryl with minimal to no relief.  She denies headache or sensitivity to prednisone.  She reports that she previously was prescribed this she experienced global amnesia.  She endorses that she was able to tolerate a low-dose with her cataract surgery. 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   Word finding difficulty 02/14/2022   Change in mental status 02/09/2022   Contact dermatitis due to plant 11/21/2021   Prediabetes 08/05/2019   Constipation 05/28/2017   Multinodular goiter 12/06/2016   Keratosis, seborrheic 09/19/2016   Essential hypertension  10/12/2015   Pure hypercholesterolemia 10/12/2015   Vitamin D  deficiency, unspecified 10/12/2015   Osteoporosis 10/12/2015    Past Surgical History:  Procedure Laterality Date   TUBAL LIGATION      OB History     Gravida  2   Para  2   Term  2   Preterm      AB      Living  2      SAB      IAB      Ectopic      Multiple      Live Births  2            Home Medications    Prior to Admission medications   Medication Sig Start Date End Date Taking? Authorizing Provider  amlodipine -olmesartan  (AZOR ) 10-20 MG tablet Take 1 tablet by mouth daily. 06/27/23  Yes [place], Betty G, MD  Ascorbic Acid  (VITAMIN C ) 250 MG CHEW Chew 250 mg by mouth daily. 04/24/16  Yes [provider]  aspirin EC 81 MG tablet Take 81 mg by mouth daily.   Yes [provider]  Calcium Carbonate Antacid (TUMS PO) Take 2 tablets by mouth daily as needed (heartburn).   Yes [provider]  Calcium-Phosphorus-Vitamin D  (CALCIUM GUMMIES) 250-100-500 MG-MG-UNIT CHEW Chew 2 tablets by mouth daily. 05/05/21  Yes [provider]  denosumab  (PROLIA ) 60 MG/ML SOSY injection Inject 60 mg into the skin every 6 (six) months.  Yes [provider]  estradiol  (ESTRACE ) 0.1 MG/GM vaginal cream Place 0.5 g vaginally 2 (two) times a week. Place 0.5g nightly for two weeks then twice a week after 08/21/22  Yes Zuleta, Kaitlin G, NP  MAGNESIUM OXIDE PO Take 0.5 tablets by mouth daily.   Yes [provider]  methylPREDNISolone (MEDROL) 4 MG TBPK tablet 1 tablet daily 09/08/23 09/14/23 Yes Damarko Stitely, Marcelene Sep, NP  naphazoline-pheniramine (NAPHCON-A) 0.025-0.3 % ophthalmic solution Place 1 drop into both eyes every 4 (four) hours as needed for eye irritation. 09/08/23  Yes Gregoria Leas, NP  Polyethylene Glycol 3350 (MIRALAX PO) Take 17 g by mouth as needed (constipation).   Yes [provider]  pravastatin  (PRAVACHOL ) 20 MG tablet TAKE 1 TABLET BY MOUTH EVERY DAY  02/12/23  Yes [place], Betty G, MD  Vibegron (GEMTESA) 75 MG TABS Take 1 tablet by mouth daily at 12 noon.   Yes [provider]    Family History Family History  Problem Relation Age of Onset   Heart disease Mother    Hypertension Father    Diabetes Father    Thyroid  disease Neg Hx     Social History Social History   Tobacco Use   Smoking status: Never   Smokeless tobacco: Never  Vaping Use   Vaping status: Never Used  Substance Use Topics   Alcohol use: Yes    Comment: social   Drug use: No     Allergies   Prednisone, Ciprofloxacin, Lisinopril -hydrochlorothiazide , and Ibuprofen   Review of Systems Review of Systems   Physical Exam Triage Vital Signs ED Triage Vitals  Encounter Vitals Group     BP 09/08/23 1032 130/83     Systolic BP Percentile --      Diastolic BP Percentile --      Pulse Rate 09/08/23 1032 80     Resp 09/08/23 1032 20     Temp 09/08/23 1032 97.8 F (36.6 C)     Temp src --      SpO2 09/08/23 1032 96 %     Weight --      Height --      Head Circumference --      Peak Flow --      Pain Score 09/08/23 1028 2     Pain Loc --      Pain Education --      Exclude from Growth Chart --    No data found.  Updated Vital Signs BP 130/83   Pulse 80   Temp 97.8 F (36.6 C)   Resp 20   SpO2 96%   Visual Acuity Right Eye Distance:   Left Eye Distance:   Bilateral Distance:    Right Eye Near:   Left Eye Near:    Bilateral Near:     Physical Exam Constitutional:      Appearance: She is normal weight.  HENT:     Head: Normocephalic and atraumatic.   Eyes:     General: Allergic shiner present.        Right eye: No foreign body or discharge.        Left eye: No foreign body or discharge.     Extraocular Movements: Extraocular movements intact.      Comments: Slight erythema   Neurological:     Mental Status: She is alert.      UC Treatments / Results  Labs (all labs ordered are listed, but only abnormal results  are displayed) Labs Reviewed -  No data to display  EKG   Radiology No results found.  Procedures Procedures (including critical care time)  Medications Ordered in UC Medications - No data to display  Initial Impression / Assessment and Plan / UC Course  I have reviewed the triage vital signs and the nursing notes.  Pertinent labs & imaging results that were available during my care of the patient were reviewed by me and considered in my medical decision making (see chart for details).     Swelling of eyelid Final Clinical Impressions(s) / UC Diagnoses   Final diagnoses:  Allergic reaction, initial encounter  Facial swelling  Allergic contact dermatitis, unspecified trigger     Discharge Instructions      You have been diagnosed with allergic reaction with contact dermatitis.  You have been prescribed Medrol 4 mg 1 tablet daily for facial swelling and inflammation.  I am aware of the prednisone allergy.  You will only take the Medrol 4mg  to prevent any adverse effects with memory.  As discussed please contact your family and neighbor make them aware that you have started the medication and to be on alert for any changes.  You have also been prescribed Naphazoline/pheiralamine 0.025/0.3 1 drop both eyes every 4 hours while awake.  You have been encouraged to take a antihistamine at night to reduce the desire to scratch. As discussed you will apply aloe to your face to help with the dryness.   ED Prescriptions     Medication Sig Dispense Auth. Provider   methylPREDNISolone (MEDROL) 4 MG TBPK tablet 1 tablet daily 21 tablet Gregoria Leas, NP   naphazoline-pheniramine (NAPHCON-A) 0.025-0.3 % ophthalmic solution Place 1 drop into both eyes every 4 (four) hours as needed for eye irritation. 15 mL Gregoria Leas, NP      PDMP not reviewed this encounter.   Eleanore Grey [place], [place] 09/08/23 1135

## 2023-09-08 NOTE — ED Triage Notes (Signed)
 PT reports swelling to upper eye lids may be 3 days ago. Unknown cause.

## 2023-09-08 NOTE — Discharge Instructions (Addendum)
 You have been diagnosed with allergic reaction with contact dermatitis.  You have been prescribed Medrol 4 mg 1 tablet daily for facial swelling and inflammation.  I am aware of the prednisone allergy.  You will only take the Medrol 4mg  to prevent any adverse effects with memory.  As discussed please contact your family and neighbor make them aware that you have started the medication and to be on alert for any changes.  You have also been prescribed Naphazoline/pheiralamine 0.025/0.3 1 drop both eyes every 4 hours while awake.  You have been encouraged to take a antihistamine at night to reduce the desire to scratch. As discussed you will apply aloe to your face to help with the dryness.

## 2023-09-18 NOTE — Telephone Encounter (Signed)
 Pt has apt with Kaitlin in July 2025.  1 month refill given KD CMA

## 2023-10-15 ENCOUNTER — Ambulatory Visit: Admitting: Obstetrics and Gynecology

## 2023-12-17 ENCOUNTER — Telehealth: Payer: Self-pay

## 2023-12-17 ENCOUNTER — Other Ambulatory Visit: Payer: Self-pay

## 2023-12-17 DIAGNOSIS — M816 Localized osteoporosis [Lequesne]: Secondary | ICD-10-CM

## 2023-12-17 MED ORDER — DENOSUMAB 60 MG/ML ~~LOC~~ SOSY: 60.0000 mg | PREFILLED_SYRINGE | SUBCUTANEOUS | Status: DC

## 2023-12-17 NOTE — Telephone Encounter (Signed)
 Prolia VOB initiated via [email or web address]  Next Prolia inj DUE: 12/28/23

## 2023-12-17 NOTE — Progress Notes (Signed)
 Patient on Bone Density List. Order placed for PA.

## 2023-12-18 ENCOUNTER — Other Ambulatory Visit (HOSPITAL_COMMUNITY): Payer: Self-pay

## 2023-12-18 NOTE — Telephone Encounter (Signed)
 Pt ready for scheduling for PROLIA  on or after : 12/28/23  Option# 1: Buy/Bill (Office supplied medication)  Out-of-pocket cost due at time of clinic visit: $332  Number of injection/visits approved: ---  Primary: [employer] Prolia  co-insurance: 20% Admin fee co-insurance: 0%  Secondary: --- Prolia  co-insurance:  Admin fee co-insurance:   Medical Benefit Details: Date Benefits were checked: 12/17/23 Deductible: NO/ Coinsurance: 20%/ Admin Fee: 0%  Prior Auth: N/A PA# Expiration Date:   # of doses approved: ----------------------------------------------------------------------- Option# 2- Med Obtained from pharmacy:  Pharmacy benefit: Copay $227.30 (Paid to pharmacy) Admin Fee: 0% (Pay at clinic)  Prior Auth: N/A PA# Expiration Date:   # of doses approved:   If patient wants fill through the pharmacy benefit please send prescription to: [facility], and include estimated need by date in rx notes. Pharmacy will ship medication directly to the office.  Patient NOT eligible for Prolia  Copay Card. Copay Card can make patient's cost as little as $25. Link to apply: [email or web address]://[email or web address]  ** This summary of benefits is an estimation of the patient's out-of-pocket cost. Exact cost may very based on individual plan coverage.

## 2023-12-25 NOTE — Addendum Note (Signed)
 Addended by: DIONISIO CAMELIA PARAS on: 12/25/2023 08:51 AM   Modules accepted: Orders

## 2023-12-25 NOTE — Progress Notes (Signed)
 Patient has moved out of state to [place]  and will not be returning to [place] . Patient is living in assisted living close to children. Patient states she will received healthcare in her state and her insurance has changed.

## 2024-02-05 ENCOUNTER — Other Ambulatory Visit: Payer: Self-pay | Admitting: Family Medicine

## 2024-02-05 DIAGNOSIS — I10 Essential (primary) hypertension: Secondary | ICD-10-CM

## 2024-04-14 ENCOUNTER — Encounter: Payer: Self-pay | Admitting: *Deleted
